# Patient Record
Sex: Male | Born: 1960 | Race: Black or African American | Hispanic: No | Marital: Single | Smoking: Current every day smoker
Health system: Southern US, Community
[De-identification: ages and names within clinical notes are randomized; demographics above are authoritative.]

## PROBLEM LIST (undated history)

## (undated) DIAGNOSIS — H9191 Unspecified hearing loss, right ear: Secondary | ICD-10-CM

## (undated) DIAGNOSIS — I639 Cerebral infarction, unspecified: Secondary | ICD-10-CM

## (undated) DIAGNOSIS — I10 Essential (primary) hypertension: Secondary | ICD-10-CM

## (undated) DIAGNOSIS — K219 Gastro-esophageal reflux disease without esophagitis: Secondary | ICD-10-CM

## (undated) HISTORY — PX: PROSTATE SURGERY: SHX751

## (undated) HISTORY — DX: Unspecified hearing loss, right ear: H91.91

## (undated) HISTORY — DX: Gastro-esophageal reflux disease without esophagitis: K21.9

---

## 1983-04-17 HISTORY — PX: OTHER SURGICAL HISTORY: SHX169

## 2010-08-09 ENCOUNTER — Inpatient Hospital Stay: Payer: Self-pay | Admitting: Internal Medicine

## 2011-05-17 ENCOUNTER — Ambulatory Visit: Payer: Self-pay | Admitting: Emergency Medicine

## 2011-05-17 DIAGNOSIS — I10 Essential (primary) hypertension: Secondary | ICD-10-CM

## 2011-05-17 LAB — CBC WITH DIFFERENTIAL/PLATELET
Basophil #: 0 10*3/uL (ref 0.0–0.1)
Lymphocyte %: 19.5 %
MCH: 33.5 pg (ref 26.0–34.0)
MCV: 99 fL (ref 80–100)
Monocyte %: 6.6 %
Neutrophil %: 71 %
Platelet: 195 10*3/uL (ref 150–440)
RBC: 4.57 10*6/uL (ref 4.40–5.90)
RDW: 13.8 % (ref 11.5–14.5)
WBC: 11 10*3/uL — ABNORMAL HIGH (ref 3.8–10.6)

## 2011-05-17 LAB — POTASSIUM: Potassium: 4.3 mmol/L (ref 3.5–5.1)

## 2011-05-17 LAB — PROTIME-INR: Prothrombin Time: 13.2 secs (ref 11.5–14.7)

## 2011-05-24 ENCOUNTER — Ambulatory Visit: Payer: Self-pay | Admitting: Emergency Medicine

## 2011-09-27 ENCOUNTER — Ambulatory Visit: Payer: Self-pay | Admitting: Internal Medicine

## 2012-04-16 DIAGNOSIS — I639 Cerebral infarction, unspecified: Secondary | ICD-10-CM

## 2012-04-16 HISTORY — DX: Cerebral infarction, unspecified: I63.9

## 2014-08-08 NOTE — Op Note (Signed)
PATIENT NAME:  Todd Craig, Todd Craig MR#:  [id number] DATE OF BIRTH:  1961/03/05  DATE OF PROCEDURE:  05/24/2011  PREOPERATIVE DIAGNOSIS: Right inguinal hernia.   POSTOPERATIVE DIAGNOSIS: Right inguinal hernia.   OPERATION: Repair of right inguinal hernia.   SURGEON: Vella Kohler, M.D.   FINDINGS: The patient had a very large right inguinal hernia, also had a large direct hernia, and very weak muscles on the posterior aspect of the inguinal canal.   INDICATION: This patient was seen by me in the office because of a right inguinal hernia which was very large and it was hard and difficult to reduce. The patient was having a lot of pain with this. He was then brought to surgery for repair of the hernia.   DESCRIPTION OF PROCEDURE: After he was put to sleep, the right inguinal groin area was then prepped and draped. An incision was made in the right groin. After cutting skin and subcutaneous tissue, the patient was kind of obese in this area, so I went all the way down to the external oblique fascia. The external oblique fascia was exposed. The Weitlaner retractor was then put in. The external canal was then seen and the external oblique was then opened. Care was taken not to injure the ilioinguinal nerve. The ilioinguinal nerve was then pushed back on the superior aspect behind the fascia. The patient had very large cord structures and it looked like he had a very thickened cord. With very much difficulty, I was able to lift it up at the pubic tubercle on a Penrose drain. It had a lot of thickened tissue. It looked like it could be a slider. Also there was a very large defect in the floor of the canal. After that I dissected up the cord structures, and after incising and looking for the sac, the patient had a very thickened sac and I felt I could feel a loop of bowel in it. It was stuck to the vas as well as to the cord blood vessels very firmly. It looked like it had been there for a long time. After a lot of  dissection to separate the cord structures from this hernia sac, which took a long time, I then pushed back into the abdomen. After that was pushed back in the abdomen, I put a large plug there and sutured it to the edges of the fascia. After that I placed a piece of mesh, attached it to the pubic tubercle area, to the shelving edge of the inguinal ligament, and around the cord and stitched this to each other and then superiorly to the conjoined tendon and shelving edge. I then sutured all around the cord. After this was done, the external oblique was then closed. The cord was replaced in the normal position. I made sure there was no injury to the vas or to the blood vessels. The patient did have a lot of veins in the cord. After this was done, the external oblique was then closed with interrupted Surgilon sutures, similar repair was also performed with Surgilon sutures to the mesh. After this was done, the testes were then pulled down. The subcutaneous tissue was closed with 3-0 Vicryl. The skin was closed with skin staples. A pressure dressing was applied. The patient tolerated the procedure well and was sent to the Recovery Room in satisfactory condition.   This was a very difficult repair to be done.  The patient had a very large hernia which seemed to be a slider and was  hard to reduce back into the abdomen. Also the transversalis fascia at the posterior floor was also very weak. It was difficult to repair. Finally after putting a plug in and placing mesh to reinforce this space, I was able to do it adequately. This patient also has a history of stroke and I do not know why he developed this kind of hernia. ____________________________ Welford Roche. Phylis Bougie, MD msh:slb D: 05/24/2011 09:53:27 ET T: 05/24/2011 10:07:11 ET JOB#: [id number]  cc: Krishang Reading S. Phylis Bougie, MD, <Dictator> Cletis Athens, MD Sharene Butters MD ELECTRONICALLY SIGNED 05/24/2011 12:32

## 2017-01-02 ENCOUNTER — Encounter: Payer: Self-pay | Admitting: *Deleted

## 2017-01-02 ENCOUNTER — Emergency Department
Admission: EM | Admit: 2017-01-02 | Discharge: 2017-01-02 | Disposition: A | Discharge status: Home / Self Care | Payer: Medicaid Other | Attending: Emergency Medicine | Admitting: Emergency Medicine

## 2017-01-02 ENCOUNTER — Emergency Department: Payer: Medicaid Other

## 2017-01-02 DIAGNOSIS — W010XXA Fall on same level from slipping, tripping and stumbling without subsequent striking against object, initial encounter: Secondary | ICD-10-CM | POA: Insufficient documentation

## 2017-01-02 DIAGNOSIS — Y92195 Garage of other specified residential institution as the place of occurrence of the external cause: Secondary | ICD-10-CM | POA: Diagnosis not present

## 2017-01-02 DIAGNOSIS — I10 Essential (primary) hypertension: Secondary | ICD-10-CM | POA: Insufficient documentation

## 2017-01-02 DIAGNOSIS — Y999 Unspecified external cause status: Secondary | ICD-10-CM | POA: Diagnosis not present

## 2017-01-02 DIAGNOSIS — Z8673 Personal history of transient ischemic attack (TIA), and cerebral infarction without residual deficits: Secondary | ICD-10-CM | POA: Diagnosis not present

## 2017-01-02 DIAGNOSIS — F17210 Nicotine dependence, cigarettes, uncomplicated: Secondary | ICD-10-CM | POA: Diagnosis not present

## 2017-01-02 DIAGNOSIS — S82402A Unspecified fracture of shaft of left fibula, initial encounter for closed fracture: Secondary | ICD-10-CM | POA: Insufficient documentation

## 2017-01-02 DIAGNOSIS — S8992XA Unspecified injury of left lower leg, initial encounter: Secondary | ICD-10-CM | POA: Diagnosis present

## 2017-01-02 DIAGNOSIS — Y9301 Activity, walking, marching and hiking: Secondary | ICD-10-CM | POA: Diagnosis not present

## 2017-01-02 HISTORY — DX: Essential (primary) hypertension: I10

## 2017-01-02 HISTORY — DX: Cerebral infarction, unspecified: I63.9

## 2017-01-02 MED ORDER — OXYCODONE-ACETAMINOPHEN 7.5-325 MG PO TABS
1.0000 | ORAL_TABLET | Freq: Four times a day (QID) | ORAL | 0 refills | Status: AC | PRN
Start: 2017-01-02 — End: 2018-01-02

## 2017-01-02 MED ORDER — TRAMADOL HCL 50 MG PO TABS
50.0000 mg | ORAL_TABLET | Freq: Once | ORAL | Status: AC
  Administered 2017-01-02: 50 mg via ORAL
  Filled 2017-01-02: qty 1

## 2017-01-02 MED ORDER — IBUPROFEN 600 MG PO TABS: 600.0000 mg | ORAL_TABLET | Freq: Three times a day (TID) | ORAL | 0 refills | Status: DC | PRN

## 2017-01-02 MED ORDER — OXYCODONE-ACETAMINOPHEN 7.5-325 MG PO TABS: 1.0000 | ORAL_TABLET | ORAL | 0 refills | Status: DC | PRN

## 2017-01-02 MED ORDER — IBUPROFEN 600 MG PO TABS
600.0000 mg | ORAL_TABLET | Freq: Once | ORAL | Status: AC
  Administered 2017-01-02: 600 mg via ORAL
  Filled 2017-01-02: qty 1

## 2017-01-02 NOTE — ED Notes (Signed)
Patient presents to the ED with left knee and left shoulder pain that began after falling in his garage yesterday evening.  Patient states he went from outside, into the dark garage and tripped over a dolly and fell.  Patient states he believes he did hit his head, did not pass out but did feel dazed.  Patient is in no obvious distress at this time.

## 2017-01-02 NOTE — ED Notes (Signed)
PT has known hx of HTN but reports he does not take his prescribed medication by choice.

## 2017-01-02 NOTE — ED Provider Notes (Signed)
[facility] Emergency Department Provider Note   ____________________________________________   First MD Initiated Contact with Patient 01/02/17 1254     (approximate)  I have reviewed the triage vital signs and the nursing notes.   HISTORY  Chief Complaint Fall    HPI Todd Craig is a 56 y.o. male patient complaining of pain to the upper and lower left extremities secondary to a trip and fall yesterday. Patient stated he tripped over a dolly and fell landed on his left side. Patient state pain to the left shoulder, left knee, and left ankle.Patient state most of his pain is located in the left ankle. He rates his overall pain as 8/10. No palliative measures prior to arrival. Ice packs placed on ankle secondary to obvious edema in triage.   Past Medical History:  Diagnosis Date  . Hypertension   . Stroke [facility])     There are no active problems to display for this patient.   History reviewed. No pertinent surgical history.  Prior to Admission medications   Medication Sig Start Date End Date Taking? Authorizing Provider  ibuprofen (ADVIL,MOTRIN) 600 MG tablet Take 1 tablet (600 mg total) by mouth every 8 (eight) hours as needed. 01/02/17   Sable Feil, PA-C  oxyCODONE-acetaminophen (PERCOCET) 7.5-325 MG tablet Take 1 tablet by mouth every 4 (four) hours as needed for severe pain. 01/02/17 01/02/18  Sable Feil, PA-C    Allergies Patient has no allergy information on record.  History reviewed. No pertinent family history.  Social History Social History  Substance Use Topics  . Smoking status: Current Every Day Smoker    Packs/day: 1.00    Types: Cigarettes  . Smokeless tobacco: Never Used  . Alcohol use 3.0 oz/week    5 Cans of beer per week    Review of Systems  Constitutional: No fever/chills Eyes: No visual changes. ENT: No sore throat. Cardiovascular: Denies chest pain. Respiratory: Denies shortness of  breath. Gastrointestinal: No abdominal pain.  No nausea, no vomiting.  No diarrhea.  No constipation. Genitourinary: Negative for dysuria. Musculoskeletal: Left shoulder, left knee, and left ankle pain Skin: Negative for rash. Neurological: Negative for headaches, focal weakness or numbness. Endocrine:Hypertension ____________________________________________   PHYSICAL EXAM:  VITAL SIGNS: ED Triage Vitals  Enc Vitals Group     BP 01/02/17 1230 (!) 153/92     Pulse Rate 01/02/17 1230 91     Resp 01/02/17 1230 16     Temp 01/02/17 1230 98.8 F (37.1 C)     Temp Source 01/02/17 1230 Oral     SpO2 01/02/17 1230 96 %     Weight 01/02/17 1230 234 lb (106.1 kg)     Height 01/02/17 1230 6\' 2"  (1.88 m)     Head Circumference --      Peak Flow --      Pain Score 01/02/17 1229 8     Pain Loc --      Pain Edu? --      Excl. in [place]? --     Constitutional: Alert and oriented. Well appearing and in no acute distress. Neck: No stridor.  No cervical spine tenderness to palpation. Cardiovascular: Normal rate, regular rhythm. Grossly normal heart sounds.  Good peripheral circulation. Respiratory: Normal respiratory effort.  No retractions. Lungs CTAB. Musculoskeletal: No obvious deformity to the left shoulder, left knee, or left ankle. Patient has decreased range of motion with abduction overhead reaching to left shoulder. Moderate guarding palpation GH joint. Examination of  the left knee shows no obvious effusion. Patient has moderate guarding with palpation at the insertion point of the MCL. Examination of left ankle shows moderate edema to lateral malleolus. Patient has full and equal range of motion to complain of pain of the ankle. Neurologic:  Normal speech and language. No gross focal neurologic deficits are appreciated. No gait instability. Skin:  Skin is warm, dry and intact. No rash noted. Psychiatric: Mood and affect are normal. Speech and behavior are  normal.  ____________________________________________   LABS (all labs ordered are listed, but only abnormal results are displayed)  Labs Reviewed - No data to display ____________________________________________  EKG   ____________________________________________  RADIOLOGY  Dg Ankle Complete Left  Result Date: 01/02/2017 CLINICAL DATA:  Fall last night onto left ankle. EXAM: LEFT ANKLE COMPLETE - 3+ VIEW COMPARISON:  None. FINDINGS: There is a coronal oblique fracture of the distal fibular diaphysis, nondisplaced. Normal ankle joint alignment. No medial clear space widening. Mild spurring at the medial malleolus without superimposed fracture. Equivocal for ankle joint effusion. IMPRESSION: Nondisplaced distal fibular diaphysis fracture. Normal alignment at the ankle. Electronically Signed   By: Monte Fantasia M.D.   On: 01/02/2017 13:15   Dg Shoulder Left  Result Date: 01/02/2017 CLINICAL DATA:  Fall in garage last night. Left shoulder pain. Insert H EXAM: LEFT SHOULDER - 2+ VIEW COMPARISON:  None. FINDINGS: There is no evidence of fracture or dislocation. Mild degenerative spurring seen along the undersurface of the acromion process. No other focal bone abnormality. Soft tissues are unremarkable. IMPRESSION: No acute findings. Mild degenerative spurring of the acromion process. Electronically Signed   By: Earle Gell M.D.   On: 01/02/2017 14:02    ____________________________________________   PROCEDURES  Procedure(s) performed: None  Procedures  Critical Care performed: No  ____________________________________________   INITIAL IMPRESSION / ASSESSMENT AND PLAN / ED COURSE  Pertinent labs & imaging results that were available during my care of the patient were reviewed by me and considered in my medical decision making (see chart for details).  Patient presents with pain to the upper and lower left extremities secondary to fall. X-ray was positive only for left distal  fibular fracture. Patient was placed in a splint and given crutches for ambulation. Patient advised contact orthopedics to schedule follow-up appointment for definitive evaluation and treatment. Patient given discharge Instructions and a prescription for ibuprofen and Percocets.      ____________________________________________   FINAL CLINICAL IMPRESSION(S) / ED DIAGNOSES  Final diagnoses:  Closed fracture of shaft of left fibula, unspecified fracture morphology, initial encounter      NEW MEDICATIONS STARTED DURING THIS VISIT:  New Prescriptions   IBUPROFEN (ADVIL,MOTRIN) 600 MG TABLET    Take 1 tablet (600 mg total) by mouth every 8 (eight) hours as needed.   OXYCODONE-ACETAMINOPHEN (PERCOCET) 7.5-325 MG TABLET    Take 1 tablet by mouth every 4 (four) hours as needed for severe pain.     Note:  This document was prepared using Dragon voice recognition software and may include unintentional dictation errors.    Sable Feil, PA-C 01/02/17 1408    Carrie Mew, MD 01/04/17 [phone]

## 2017-01-02 NOTE — Discharge Instructions (Signed)
Wear splint and ambulate with support and evaluation by orthopedics.

## 2017-01-02 NOTE — ED Triage Notes (Signed)
PT reports tripping over a dolly and falling on left side. PT reports pain in left ankle, knee and shoulder. Swelling noted in left ankle. Pt reports he is unable to bear weight on left foot.Pt able to move toes and sensation is intact. Pt denies LOC.

## 2017-01-03 DIAGNOSIS — S8265XA Nondisplaced fracture of lateral malleolus of left fibula, initial encounter for closed fracture: Secondary | ICD-10-CM | POA: Diagnosis not present

## 2017-01-03 DIAGNOSIS — M25512 Pain in left shoulder: Secondary | ICD-10-CM | POA: Diagnosis not present

## 2017-02-20 DIAGNOSIS — S8265XD Nondisplaced fracture of lateral malleolus of left fibula, subsequent encounter for closed fracture with routine healing: Secondary | ICD-10-CM | POA: Diagnosis not present

## 2019-03-28 DIAGNOSIS — Z20828 Contact with and (suspected) exposure to other viral communicable diseases: Secondary | ICD-10-CM | POA: Diagnosis not present

## 2019-12-30 ENCOUNTER — Ambulatory Visit: Payer: Medicaid Other | Admitting: Internal Medicine

## 2019-12-30 ENCOUNTER — Other Ambulatory Visit: Payer: Self-pay

## 2020-01-08 ENCOUNTER — Other Ambulatory Visit: Payer: Self-pay

## 2020-01-11 ENCOUNTER — Encounter: Payer: Self-pay | Admitting: Emergency Medicine

## 2020-01-11 ENCOUNTER — Encounter: Payer: Self-pay | Admitting: Nurse Practitioner

## 2020-01-11 ENCOUNTER — Ambulatory Visit: Payer: Medicaid Other | Admitting: Nurse Practitioner

## 2020-01-11 ENCOUNTER — Emergency Department
Admission: EM | Admit: 2020-01-11 | Discharge: 2020-01-11 | Disposition: A | Discharge status: Home / Self Care | Payer: Medicaid Other | Attending: Emergency Medicine | Admitting: Emergency Medicine

## 2020-01-11 ENCOUNTER — Other Ambulatory Visit: Payer: Self-pay

## 2020-01-11 ENCOUNTER — Emergency Department: Payer: Medicaid Other

## 2020-01-11 VITALS — BP 184/130 | HR 75 | Temp 98.2°F | Ht 74.0 in | Wt 231.0 lb

## 2020-01-11 DIAGNOSIS — I160 Hypertensive urgency: Secondary | ICD-10-CM

## 2020-01-11 DIAGNOSIS — I169 Hypertensive crisis, unspecified: Secondary | ICD-10-CM | POA: Diagnosis not present

## 2020-01-11 DIAGNOSIS — J90 Pleural effusion, not elsewhere classified: Secondary | ICD-10-CM | POA: Diagnosis not present

## 2020-01-11 DIAGNOSIS — I10 Essential (primary) hypertension: Secondary | ICD-10-CM | POA: Insufficient documentation

## 2020-01-11 DIAGNOSIS — F17210 Nicotine dependence, cigarettes, uncomplicated: Secondary | ICD-10-CM | POA: Insufficient documentation

## 2020-01-11 HISTORY — DX: Hypertensive crisis, unspecified: I16.9

## 2020-01-11 LAB — COMPREHENSIVE METABOLIC PANEL
ALT: 15 U/L (ref 0–44)
AST: 16 U/L (ref 15–41)
Albumin: 4.4 g/dL (ref 3.5–5.0)
Alkaline Phosphatase: 73 U/L (ref 38–126)
Anion gap: 8 (ref 5–15)
BUN: 12 mg/dL (ref 6–20)
CO2: 27 mmol/L (ref 22–32)
Calcium: 11.3 mg/dL — ABNORMAL HIGH (ref 8.9–10.3)
Chloride: 105 mmol/L (ref 98–111)
Creatinine, Ser: 1.46 mg/dL — ABNORMAL HIGH (ref 0.61–1.24)
GFR calc Af Amer: >60 mL/min (ref >60)
GFR calc non Af Amer: 52 mL/min — ABNORMAL LOW (ref >60)
Glucose, Bld: 99 mg/dL (ref 70–99)
Potassium: 4.1 mmol/L (ref 3.5–5.1)
Sodium: 140 mmol/L (ref 135–145)
Total Bilirubin: 1.3 mg/dL — ABNORMAL HIGH (ref 0.3–1.2)
Total Protein: 7.6 g/dL (ref 6.5–8.1)

## 2020-01-11 LAB — CBC WITH DIFFERENTIAL/PLATELET
Abs Immature Granulocytes: 0.04 10*3/uL (ref 0.00–0.07)
Basophils Absolute: 0.1 10*3/uL (ref 0.0–0.1)
Basophils Relative: 1 %
Eosinophils Absolute: 0.3 10*3/uL (ref 0.0–0.5)
Eosinophils Relative: 3 %
HCT: 45.4 % (ref 39.0–52.0)
Hemoglobin: 15.9 g/dL (ref 13.0–17.0)
Immature Granulocytes: 0 %
Lymphocytes Relative: 19 %
Lymphs Abs: 2 10*3/uL (ref 0.7–4.0)
MCH: 34.9 pg — ABNORMAL HIGH (ref 26.0–34.0)
MCHC: 35 g/dL (ref 30.0–36.0)
MCV: 99.6 fL (ref 80.0–100.0)
Monocytes Absolute: 0.6 10*3/uL (ref 0.1–1.0)
Monocytes Relative: 5 %
Neutro Abs: 7.8 10*3/uL — ABNORMAL HIGH (ref 1.7–7.7)
Neutrophils Relative %: 72 %
Platelets: 197 10*3/uL (ref 150–400)
RBC: 4.56 MIL/uL (ref 4.22–5.81)
RDW: 12.2 % (ref 11.5–15.5)
WBC: 10.8 10*3/uL — ABNORMAL HIGH (ref 4.0–10.5)
nRBC: 0 % (ref 0.0–0.2)

## 2020-01-11 MED ORDER — AMLODIPINE BESYLATE 5 MG PO TABS
5.0000 mg | ORAL_TABLET | Freq: Once | ORAL | Status: AC
  Administered 2020-01-11: 14:00:00 5 mg via ORAL
  Filled 2020-01-11: qty 1

## 2020-01-11 MED ORDER — AMLODIPINE BESYLATE 5 MG PO TABS: 5.0000 mg | ORAL_TABLET | Freq: Every day | ORAL | 1 refills | Status: DC

## 2020-01-11 NOTE — ED Triage Notes (Signed)
Pt presents to ED via POV with c/o HTN, pt states hx of HTN, has been out of his BP meds for a long time. Pt sent by PCP. Pt denies symptoms with his HTN. Pt is A&O x4, NAD noted, states needs refills of BP meds. Pt states was at his PCP earlier today for BP check and a physical.

## 2020-01-11 NOTE — ED Notes (Signed)
See triage note  Presents with HTN  States he has been out of his meds for a while    Denies any sxs'

## 2020-01-11 NOTE — Patient Instructions (Addendum)
Please go to the emergency department for further treatment evaluation of his severely elevated blood pressure with intermittent headaches, blurred vision.  You have not seen a physician in several years.  You have a history of a stroke in 2012.  Your BP today puts you at  risk for a repeat stroke.  Your blood pressure today is 184/130 repeated 180/130.   Follow-up in the office to perform CPE in the future.  Hypertension, Adult Hypertension is another name for high blood pressure. High blood pressure forces your heart to work harder to pump blood. This can cause problems over time. There are two numbers in a blood pressure reading. There is a top number (systolic) over a bottom number (diastolic). It is best to have a blood pressure that is below 120/80. Healthy choices can help lower your blood pressure, or you may need medicine to help lower it. What are the causes? The cause of this condition is not known. Some conditions may be related to high blood pressure. What increases the risk?  Smoking.  Having type 2 diabetes mellitus, high cholesterol, or both.  Not getting enough exercise or physical activity.  Being overweight.  Having too much fat, sugar, calories, or salt (sodium) in your diet.  Drinking too much alcohol.  Having long-term (chronic) kidney disease.  Having a family history of high blood pressure.  Age. Risk increases with age.  Race. You may be at higher risk if you are African American.  Gender. Men are at higher risk than women before age 63. After age 59, women are at higher risk than men.  Having obstructive sleep apnea.  Stress. What are the signs or symptoms?  High blood pressure may not cause symptoms. Very high blood pressure (hypertensive crisis) may cause: ? Headache. ? Feelings of worry or nervousness (anxiety). ? Shortness of breath. ? Nosebleed. ? A feeling of being sick to your stomach (nausea). ? Throwing up (vomiting). ? Changes in how  you see. ? Very bad chest pain. ? Seizures. How is this treated?  This condition is treated by making healthy lifestyle changes, such as: ? Eating healthy foods. ? Exercising more. ? Drinking less alcohol.  Your health care provider may prescribe medicine if lifestyle changes are not enough to get your blood pressure under control, and if: ? Your top number is above 130. ? Your bottom number is above 80.  Your personal target blood pressure may vary. Follow these instructions at home: Eating and drinking   If told, follow the DASH eating plan. To follow this plan: ? Fill one half of your plate at each meal with fruits and vegetables. ? Fill one fourth of your plate at each meal with whole grains. Whole grains include whole-wheat pasta, brown rice, and whole-grain bread. ? Eat or drink low-fat dairy products, such as skim milk or low-fat yogurt. ? Fill one fourth of your plate at each meal with low-fat (lean) proteins. Low-fat proteins include fish, chicken without skin, eggs, beans, and tofu. ? Avoid fatty meat, cured and processed meat, or chicken with skin. ? Avoid pre-made or processed food.  Eat less than 1,500 mg of salt each day.  Do not drink alcohol if: ? Your doctor tells you not to drink. ? You are pregnant, may be pregnant, or are planning to become pregnant.  If you drink alcohol: ? Limit how much you use to:  0-1 drink a day for women.  0-2 drinks a day for men. ? Be aware  of how much alcohol is in your drink. In the [place] one drink equals one 12 oz bottle of beer (355 mL), one 5 oz glass of wine (148 mL), or one 1 oz glass of hard liquor (44 mL). Lifestyle   Work with your doctor to stay at a healthy weight or to lose weight. Ask your doctor what the best weight is for you.  Get at least 30 minutes of exercise most days of the week. This may include walking, swimming, or biking.  Get at least 30 minutes of exercise that strengthens your muscles  (resistance exercise) at least 3 days a week. This may include lifting weights or doing Pilates.  Do not use any products that contain nicotine or tobacco, such as cigarettes, e-cigarettes, and chewing tobacco. If you need help quitting, ask your doctor.  Check your blood pressure at home as told by your doctor.  Keep all follow-up visits as told by your doctor. This is important. Medicines  Take over-the-counter and prescription medicines only as told by your doctor. Follow directions carefully.  Do not skip doses of blood pressure medicine. The medicine does not work as well if you skip doses. Skipping doses also puts you at risk for problems.  Ask your doctor about side effects or reactions to medicines that you should watch for. Contact a doctor if you:  Think you are having a reaction to the medicine you are taking.  Have headaches that keep coming back (recurring).  Feel dizzy.  Have swelling in your ankles.  Have trouble with your vision. Get help right away if you:  Get a very bad headache.  Start to feel mixed up (confused).  Feel weak or numb.  Feel faint.  Have very bad pain in your: ? Chest. ? Belly (abdomen).  Throw up more than once.  Have trouble breathing. Summary  Hypertension is another name for high blood pressure.  High blood pressure forces your heart to work harder to pump blood.  For most people, a normal blood pressure is less than 120/80.  Making healthy choices can help lower blood pressure. If your blood pressure does not get lower with healthy choices, you may need to take medicine. This information is not intended to replace advice given to you by your health care provider. Make sure you discuss any questions you have with your health care provider. Document Revised: 12/11/2017 Document Reviewed: 12/11/2017 Elsevier Patient Education  2020 [employer].

## 2020-01-11 NOTE — Progress Notes (Signed)
Established Patient Office Visit  Subjective:  Patient ID: Todd Craig, male    DOB: 12-09-1960  Age: 59 y.o. MRN: [id number]  CC:  Chief Complaint  Patient presents with  . 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  He reports he saw Dr. Rebecka Apley many years ago.  He has been off of his BP  medication for many years.  His fiance noticed that he has been having fatigue, appeared very sluggish, and he has been complaining of headache and blurred vision.  She suspected his blood pressure was elevated and brought him in for CPE.    Currently, patient denies any  headache, chest pain, pressure,  Heaviness, tightness,  shortness of breath or DOE.  He has been experiencing epigastric discomfort and belching.  No nausea or vomiting.  He does smoke 4 cigarettes a day.  He drinks 40 ounces of beer daily.  No illicit drug use.  Blood pressure repeated by me 180/130 on right arm, 184/130 left arm, heart rate 75 pulse oximetry room air 97%.   Past Medical History:  Diagnosis Date  . GERD (gastroesophageal reflux disease)   . Hypertension   . Stroke [facility])     Past Surgical History:  Procedure Laterality Date  . right hand repair  1985   trauma with saw laceration     No family history on file.  Social History   Socioeconomic History  . Marital status: Single    Spouse name: Not on file  . Number of children: Not on file  . Years of education: Not on file  . Highest education level: Not on file  Occupational History  . Not on file  Tobacco Use  . Smoking status: Current Every Day Smoker    Packs/day: 1.00    Types: Cigarettes  . Smokeless tobacco: Never Used  Vaping Use  . Vaping Use: Never used  Substance and Sexual Activity  . Alcohol use: Yes    Alcohol/week: 5.0  standard drinks    Types: 5 Cans of beer per week  . Drug use: No  . Sexual activity: Yes  Other Topics Concern  . Not on file  Social History Narrative  . Not on file   Social Determinants of Health   Financial Resource Strain:   . Difficulty of Paying Living Expenses: Not on file  Food Insecurity:   . Worried About Charity fundraiser in the Last Year: Not on file  . Ran Out of Food in the Last Year: Not on file  Transportation Needs:   . Lack of Transportation (Medical): Not on file  . Lack of Transportation (Non-Medical): Not on file  Physical Activity:   . Days of Exercise per Week: Not on file  . Minutes of Exercise per Session: Not on file  Stress:   . Feeling of Stress : Not on file  Social Connections:   . Frequency of Communication with Friends and Family: Not on file  . Frequency of Social Gatherings with Friends and Family: Not on file  . Attends Religious Services: Not on file  . Active Member of Clubs or Organizations: Not on file  . Attends Archivist Meetings: Not on file  . Marital Status: Not on file  Intimate Partner Violence:   .  Fear of Current or Ex-Partner: Not on file  . Emotionally Abused: Not on file  . Physically Abused: Not on file  . Sexually Abused: Not on file    Outpatient Medications Prior to Visit  Medication Sig Dispense Refill  . ibuprofen (ADVIL,MOTRIN) 600 MG tablet Take 1 tablet (600 mg total) by mouth every 8 (eight) hours as needed. 15 tablet 0   No facility-administered medications prior to visit.    No Known Allergies  Review of Systems Pertinent positives as noted in history of present illness.   Objective:    Physical Exam Vitals reviewed.  Constitutional:      Appearance: Normal appearance.  Eyes:     Conjunctiva/sclera: Conjunctivae normal.     Pupils: Pupils are equal, round, and reactive to light.  Cardiovascular:     Rate and Rhythm: Normal rate and regular rhythm.     Pulses: Normal pulses.      Heart sounds: Normal heart sounds.  Pulmonary:     Effort: Pulmonary effort is normal.     Breath sounds: Normal breath sounds.  Abdominal:     Palpations: Abdomen is soft.     Tenderness: There is no abdominal tenderness.  Musculoskeletal:        General: Normal range of motion.     Cervical back: Normal range of motion and neck supple.     Right lower leg: No edema.     Left lower leg: No edema.  Skin:    General: Skin is warm and dry.  Neurological:     Mental Status: He is alert. Mental status is at baseline.     Comments: Oriented x 2 person, place- clinic, month- Nov, year      BP (!) 184/130   Pulse 75   Temp 98.2 F (36.8 C) (Oral)   Ht 6\' 2"  (1.88 m)   Wt 231 lb (104.8 kg)   SpO2 97%   BMI 29.66 kg/m  Wt Readings from Last 3 Encounters:  01/11/20 231 lb (104.8 kg)  01/02/17 234 lb (106.1 kg)   Pulse Readings from Last 3 Encounters:  01/11/20 75  01/02/17 66    BP Readings from Last 3 Encounters:  01/11/20 (!) 184/130  01/02/17 (!) 167/107    No results found for: CHOL, HDL, LDLCALC, LDLDIRECT, TRIG, CHOLHDL    Health Maintenance Due  Topic Date Due  . Hepatitis C Screening  Never done  . HIV Screening  Never done  . TETANUS/TDAP  Never done  . COLONOSCOPY  Never done    There are no preventive care reminders to display for this patient.  No results found for: TSH Lab Results  Component Value Date   WBC 11.0 (H) 05/17/2011   HGB 15.3 05/17/2011   HCT 45.0 05/17/2011   MCV 99 05/17/2011   PLT 195 05/17/2011   Lab Results  Component Value Date   K 4.0 05/24/2011   No results found for: CHOL No results found for: HDL No results found for: LDLCALC No results found for: TRIG No results found for: CHOLHDL No results found for: HGBA1C    Assessment & Plan:   Problem List Items Addressed This Visit      Cardiovascular and Mediastinum   Hypertensive crisis, unspecified - Primary      No orders of the defined types were placed in this  encounter. Please go to the emergency department for further treatment evaluation of his severely elevated blood pressure with intermittent headaches, blurred vision.  You have not seen a physician in several years.  You have a history of a stroke in 2012.  Your BP today puts you at  risk for a repeat stroke.  Your blood pressure today is 184/130 repeated 180/130.   Follow-up in the office to perform CPE in the future.  Follow-up: No follow-ups on file.   This visit occurred during the SARS-CoV-2 public health emergency.  Safety protocols were in place, including screening questions prior to the visit, additional usage of staff PPE, and extensive cleaning of exam room while observing appropriate contact time as indicated for disinfecting solutions.   Denice Paradise, NP

## 2020-01-11 NOTE — ED Provider Notes (Addendum)
Medical screening examination/treatment/procedure(s) were conducted as a shared visit with non-physician practitioner(s) and myself.  I personally evaluated the patient during the encounter.     Personally saw and evaluated the patient.  He is asymptomatic.  Referred here after physical exam where his blood pressure was noted to be significantly elevated  He does report a longstanding history of hypertension untreated.  He reports to me that a very important part of that is that his previous medicine caused erectile dysfunction and he would like to find a medicine that might treat his blood pressure that would not have high risk of that side effect.  He appears to have mild renal insufficiency, Based on discussion with patient we will start him on amlodipine.  He will be able to follow-up with the primary doctor he saw today.  He has no symptoms.  No signs of endorgan dysfunction other than a very mildly ill his creatinine which I would suspect is chronic in nature does not appear to require immediate hospitalization.     Delman Kitten, MD 01/11/20 1359   EKG personally interpreted by me at 1325 Heart rate 75 QRS 90 QTc 410 Normal sinus rhythm, left ventricular hypertrophy with repolarization abnormality.  No major changes compared to previous EKG from about 10 years ago   Delman Kitten, MD 01/11/20 1359

## 2020-01-11 NOTE — ED Provider Notes (Signed)
[facility] Emergency Department Provider Note  ____________________________________________   First MD Initiated Contact with Patient 01/11/20 1232     (approximate)  I have reviewed the triage vital signs and the nursing notes.   HISTORY  Chief Complaint Hypertension   HPI Todd Craig is a 59 y.o. male who presents to the emergency department for evaluation of hypertension.  The patient states that he has not had a primary care in quite some time, and was urged today to seek a primary care visit.  When he went to this visit, the practitioner sent him to the emergency room for evaluation of his hypertension.  The patient states that he has had hypertension since diagnosed in 2012 after his stroke.  At that time, the patient was started on a blood pressure medication but discontinued it secondary to erectile dysfunction.  He has not been on anything in quite some time.  He denies any acute symptoms at this time of his blood pressure.  He denies headache, blurred vision, weakness.         Past Medical History:  Diagnosis Date  . GERD (gastroesophageal reflux disease)   . Hypertension   . Stroke [facility])     Patient Active Problem List   Diagnosis Date Noted  . Hypertensive crisis, unspecified 01/11/2020    Past Surgical History:  Procedure Laterality Date  . right hand repair  1985   trauma with saw laceration     Prior to Admission medications   Medication Sig Start Date End Date Taking? Authorizing Provider  amLODipine (NORVASC) 5 MG tablet Take 1 tablet (5 mg total) by mouth daily. 01/11/20 03/11/20  Marlana Salvage, PA    Allergies Patient has no known allergies.  History reviewed. No pertinent family history.  Social History Social History   Tobacco Use  . Smoking status: Current Every Day Smoker    Packs/day: 1.00    Types: Cigarettes  . Smokeless tobacco: Never Used  Vaping Use  . Vaping Use: Never used  Substance Use Topics    . Alcohol use: Yes    Alcohol/week: 5.0 standard drinks    Types: 5 Cans of beer per week  . Drug use: No    Review of Systems Constitutional: No fever/chills Eyes: No visual changes. ENT: No sore throat. Cardiovascular: + Hypertension, denies chest pain. Respiratory: Denies shortness of breath. Gastrointestinal: No abdominal pain.  No nausea, no vomiting.  No diarrhea.  No constipation. Genitourinary: Negative for dysuria. Musculoskeletal: Negative for back pain. Skin: Negative for rash. Neurological: Negative for headaches, focal weakness or numbness.   ____________________________________________   PHYSICAL EXAM:  VITAL SIGNS: ED Triage Vitals [01/11/20 1223]  Enc Vitals Group     BP (!) 190/127     Pulse Rate 68     Resp 20     Temp 98.9 F (37.2 C)     Temp Source Oral     SpO2 98 %     Weight 231 lb (104.8 kg)     Height 6\' 2"  (1.88 m)     Head Circumference      Peak Flow      Pain Score 0     Pain Loc      Pain Edu?      Excl. in [place]?     Constitutional: Alert and oriented. Well appearing and in no acute distress. Eyes: Conjunctivae are normal. PERRL. EOMI. Head: Atraumatic. Nose: No congestion/rhinnorhea. Mouth/Throat: Mucous membranes are moist.  Oropharynx  non-erythematous. Neck: No stridor.   Cardiovascular: Normal rate, regular rhythm. Grossly normal heart sounds.  Good peripheral circulation. Respiratory: Normal respiratory effort.  No retractions. Lungs CTAB. Gastrointestinal: Soft and nontender. No distention. No abdominal bruits. No CVA tenderness. Musculoskeletal: No lower extremity tenderness nor edema.  No joint effusions. Neurologic:  Normal speech and language. No gross focal neurologic deficits are appreciated. No gait instability. Skin:  Skin is warm, dry and intact. No rash noted. Psychiatric: Mood and affect are normal. Speech and behavior are normal.  ____________________________________________   LABS (all labs ordered are  listed, but only abnormal results are displayed)  Labs Reviewed  CBC WITH DIFFERENTIAL/PLATELET - Abnormal; Notable for the following components:      Result Value   WBC 10.8 (*)    MCH 34.9 (*)    Neutro Abs 7.8 (*)    All other components within normal limits  COMPREHENSIVE METABOLIC PANEL - Abnormal; Notable for the following components:   Creatinine, Ser 1.46 (*)    Calcium 11.3 (*)    Total Bilirubin 1.3 (*)    GFR calc non Af Amer 52 (*)    All other components within normal limits   ____________________________________________  EKG  See Dr. Malachi Bonds note. ____________________________________________  [place]  Official radiology report(s): DG Chest 2 View  Result Date: 01/11/2020 CLINICAL DATA:  Hypertension. EXAM: CHEST - 2 VIEW COMPARISON:  08/09/2010. FINDINGS: Mediastinum and hilar structures normal. Lungs are clear. Tiny bilateral pleural effusions cannot be excluded. Heart size normal. No acute bony abnormality identified. No evidence of fracture. IMPRESSION: Tiny bilateral pleural effusions cannot be excluded. No acute cardiopulmonary disease otherwise noted. Electronically Signed   By: Marcello Moores  Register   On: 01/11/2020 13:17    ____________________________________________   INITIAL IMPRESSION / ASSESSMENT AND PLAN / ED COURSE  As part of my medical decision making, I reviewed the following data within the [place] notes reviewed and incorporated        Todd Craig is a 60 year old male who presents to the emergency department for evaluation of hypertension as he was referred from a primary care office.  Upon presentation, the patient's blood pressure was 190/127 in triage.  Other vital signs were normal.  The patient reports being asymptomatic at this time.  Despite this, basic labs, EKG and chest x-ray were performed.  CMP demonstrates an elevated creatinine of 1.46 with a normal GFR and BUN.  EKG also demonstrates some left  ventricular hypertrophy which is grossly unchanged from his last EKG in 2012.  Straight is grossly normal.  At this time, we will treat the patient with amlodipine here and monitor his blood pressure.   The patient's blood pressure came down to 156/109 actually just prior to receipt of the medication.  The case was discussed with Dr. Jacqualine Code who also saw the patient in person and assisted with the plan.  At this time, feel the patient is safe for discharge as he does not have evidence of any acute organ damage and is asymptomatic with his blood pressure.  I will prescribe the patient a 60-day supply of the amlodipine, this has a lesser report of erectile dysfunction.  The patient will follow up with the primary care that he established with today for further evaluation of his renal function as well as continued treatment of his hypertension.  The patient is amenable with this plan is stable for discharge at this time.     ____________________________________________   FINAL CLINICAL IMPRESSION(S) /  ED DIAGNOSES  Final diagnoses:  Hypertensive urgency     ED Discharge Orders         Ordered    amLODipine (NORVASC) 5 MG tablet  Daily       Note to Pharmacy: Prescriber not yet authorized with medicaid. Please authorize by Laban Emperor, PA-C. NPI: [id number]   01/11/20 1456          *Please note:  Todd Craig was evaluated in Emergency Department on 01/11/2020 for the symptoms described in the history of present illness. He was evaluated in the context of the global COVID-19 pandemic, which necessitated consideration that the patient might be at risk for infection with the SARS-CoV-2 virus that causes COVID-19. Institutional protocols and algorithms that pertain to the evaluation of patients at risk for COVID-19 are in a state of rapid change based on information released by regulatory bodies including the CDC and federal and state organizations. These policies and algorithms were followed  during the patient's care in the ED.  Some ED evaluations and interventions may be delayed as a result of limited staffing during and the pandemic.*   Note:  This document was prepared using Dragon voice recognition software and may include unintentional dictation errors.    Marlana Salvage, PA 01/11/20 Jeffie Pollock    Delman Kitten, MD 01/11/20 2005

## 2020-01-11 NOTE — Discharge Instructions (Addendum)
Please take the amlodipine once daily as prescribed.  Please follow-up with your primary care regarding your hypertension and kidney function.

## 2020-01-12 ENCOUNTER — Telehealth: Payer: Self-pay

## 2020-01-12 NOTE — Telephone Encounter (Signed)
Transition Care Management Unsuccessful Follow-up Telephone Call  Date of discharge and from where:  01/11/2020 [facility]   Attempts:  1st Attempt  Reason for unsuccessful TCM follow-up call:  Left voice message with significant other to call back.   TA/CMA

## 2020-01-15 ENCOUNTER — Other Ambulatory Visit: Payer: Self-pay

## 2020-01-19 ENCOUNTER — Encounter: Payer: Self-pay | Admitting: Nurse Practitioner

## 2020-01-19 ENCOUNTER — Other Ambulatory Visit: Payer: Self-pay

## 2020-01-19 ENCOUNTER — Ambulatory Visit (INDEPENDENT_AMBULATORY_CARE_PROVIDER_SITE_OTHER): Payer: Medicaid Other | Admitting: Nurse Practitioner

## 2020-01-19 VITALS — BP 150/100 | HR 81 | Temp 97.9°F | Resp 16 | Wt 232.0 lb

## 2020-01-19 DIAGNOSIS — I10 Essential (primary) hypertension: Secondary | ICD-10-CM | POA: Insufficient documentation

## 2020-01-19 DIAGNOSIS — E663 Overweight: Secondary | ICD-10-CM

## 2020-01-19 DIAGNOSIS — F17290 Nicotine dependence, other tobacco product, uncomplicated: Secondary | ICD-10-CM

## 2020-01-19 MED ORDER — AMLODIPINE BESYLATE 5 MG PO TABS: 5.0000 mg | ORAL_TABLET | Freq: Every day | ORAL | 1 refills | Status: DC

## 2020-01-19 NOTE — Progress Notes (Signed)
Established Patient Office Visit  Subjective:  Patient ID: Todd Craig, male    DOB: 30-Jan-1961  Age: 59 y.o. MRN: [id number]  CC:  Chief Complaint  Patient presents with  . Follow-up    ED follow up- hypertension    HPI Todd Craig is a 59 year old with hx of CVA (2012) who returns for hypertension.  He established care on 01/11/2020 after a long hiatus without routine medical care. He had a severely elevated BP 184/130 with blurred vision and was sent to the emergency department.  01/11/20: ED: His vital signs on arrival in the  ED [id number], SpO2 98%.  He was treated with amlodipine 5 mg.  Blood pressure came down to 156/109 just prior to receiving medication.  He was felt safe for discharge as he was asymptomatic with his hypertension and did not have evidence of acute organ damage.  His c-Met showed an elevated creatinine of 1.46 with normal GFR and BUN. His Hgb was normal 15.9, MCH and WBC slightly elevated.  01/11/20: CXR:  IMPRESSION: Tiny bilateral pleural effusions cannot be excluded. No acute cardiopulmonary disease otherwise noted.  01/11/2020: EKG showed left ventricular hypertrophy grossly unchanged from EKG in 2012.     Chemistry      Component Value Date/Time   NA 140 01/11/2020 1300   K 4.1 01/11/2020 1300   K 4.0 05/24/2011 0640   CL 105 01/11/2020 1300   CO2 27 01/11/2020 1300   BUN 12 01/11/2020 1300   CREATININE 1.46 (H) 01/11/2020 1300      Component Value Date/Time   CALCIUM 11.3 (H) 01/11/2020 1300   ALKPHOS 73 01/11/2020 1300   AST 16 01/11/2020 1300   ALT 15 01/11/2020 1300   BILITOT 1.3 (H) 01/11/2020 1300      Last CBC Lab Results  Component Value Date   WBC 10.8 (H) 01/11/2020   HGB 15.9 01/11/2020   HCT 45.4 01/11/2020   MCV 99.6 01/11/2020   MCH 34.9 (H) 01/11/2020   RDW 12.2 01/11/2020   PLT 197 01/11/2020   Today, he comes in with his fiancee.  He has been taking his amlodipine 5 mg daily as directed.  He does not check his blood  pressure at home.  He is no longer experiencing fatigue or blurred vision. He  feels much better.  He has no epigastric discomfort or belching.  No nausea or vomiting.  He has cut back his alcohol from 40 ounces of beer daily to none in the last few days.  He does still smoke his 4 cigarettes a day.  We advised eye check since he had intermittent blurred vision. This needs to be rescheduled.  He has not seen a dentist in 30 years.  He has Medicaid and does not know if he has Designer, fashion/clothing.  He has no dental concerns.   Past Medical History:  Diagnosis Date  . GERD (gastroesophageal reflux disease)   . Hypertension   . Stroke [facility])     Past Surgical History:  Procedure Laterality Date  . right hand repair  1985   trauma with saw laceration     History reviewed. No pertinent family history.  Social History   Socioeconomic History  . Marital status: Single    Spouse name: Not on file  . Number of children: Not on file  . Years of education: Not on file  . Highest education level: Not on file  Occupational History  . Not on file  Tobacco Use  .  Smoking status: Current Every Day Smoker    Packs/day: 1.00    Types: Cigarettes  . Smokeless tobacco: Never Used  Vaping Use  . Vaping Use: Never used  Substance and Sexual Activity  . Alcohol use: Yes    Alcohol/week: 5.0 standard drinks    Types: 5 Cans of beer per week  . Drug use: No  . Sexual activity: Yes  Other Topics Concern  . Not on file  Social History Narrative  . Not on file   Social Determinants of Health   Financial Resource Strain:   . Difficulty of Paying Living Expenses: Not on file  Food Insecurity:   . Worried About Charity fundraiser in the Last Year: Not on file  . Ran Out of Food in the Last Year: Not on file  Transportation Needs:   . Lack of Transportation (Medical): Not on file  . Lack of Transportation (Non-Medical): Not on file  Physical Activity:   . Days of Exercise per Week: Not on file    . Minutes of Exercise per Session: Not on file  Stress:   . Feeling of Stress : Not on file  Social Connections:   . Frequency of Communication with Friends and Family: Not on file  . Frequency of Social Gatherings with Friends and Family: Not on file  . Attends Religious Services: Not on file  . Active Member of Clubs or Organizations: Not on file  . Attends Archivist Meetings: Not on file  . Marital Status: Not on file  Intimate Partner Violence:   . Fear of Current or Ex-Partner: Not on file  . Emotionally Abused: Not on file  . Physically Abused: Not on file  . Sexually Abused: Not on file    Outpatient Medications Prior to Visit  Medication Sig Dispense Refill  . amLODipine (NORVASC) 5 MG tablet Take 1 tablet (5 mg total) by mouth daily. 30 tablet 1   No facility-administered medications prior to visit.    No Known Allergies  Review of Systems  Constitutional: Negative for chills, fever and unexpected weight change.  HENT: Negative.   Respiratory: Negative.   Cardiovascular: Negative.   Gastrointestinal: Negative.   Genitourinary: Negative.   Musculoskeletal: Negative.   Neurological: Negative for dizziness, seizures, speech difficulty, light-headedness and headaches.  Psychiatric/Behavioral: Negative.       Objective:    Physical Exam Vitals reviewed.  Constitutional:      Appearance: He is obese.  HENT:     Head: Normocephalic.  Eyes:     Conjunctiva/sclera: Conjunctivae normal.     Pupils: Pupils are equal, round, and reactive to light.  Cardiovascular:     Rate and Rhythm: Normal rate and regular rhythm.     Pulses: Normal pulses.     Heart sounds: Normal heart sounds.  Pulmonary:     Effort: Pulmonary effort is normal.     Breath sounds: Normal breath sounds.  Abdominal:     Palpations: Abdomen is soft.     Tenderness: There is no abdominal tenderness.  Musculoskeletal:        General: Normal range of motion.     Cervical back:  Normal range of motion and neck supple.     Right lower leg: No edema.     Left lower leg: No edema.  Skin:    General: Skin is warm and dry.  Neurological:     Mental Status: He is alert and oriented to person, place, and time. Mental  status is at baseline.  Psychiatric:        Mood and Affect: Mood normal.        Behavior: Behavior normal.     BP (!) 150/100   Pulse 81   Temp 97.9 F (36.6 C) (Oral)   Resp 16   Wt 232 lb (105.2 kg)   SpO2 98%   BMI 29.79 kg/m  Wt Readings from Last 3 Encounters:  01/19/20 232 lb (105.2 kg)  01/11/20 231 lb (104.8 kg)  01/11/20 231 lb (104.8 kg)   Pulse Readings from Last 3 Encounters:  01/19/20 81  01/11/20 70  01/11/20 75    BP Readings from Last 3 Encounters:  01/19/20 (!) 150/100  01/11/20 (!) 156/109  01/11/20 (!) 184/130    No results found for: CHOL, HDL, LDLCALC, LDLDIRECT, TRIG, CHOLHDL    Health Maintenance Due  Topic Date Due  . Hepatitis C Screening  Never done  . HIV Screening  Never done  . TETANUS/TDAP  Never done  . COLONOSCOPY  Never done    There are no preventive care reminders to display for this patient.  No results found for: TSH Lab Results  Component Value Date   WBC 10.8 (H) 01/11/2020   HGB 15.9 01/11/2020   HCT 45.4 01/11/2020   MCV 99.6 01/11/2020   PLT 197 01/11/2020   Lab Results  Component Value Date   NA 140 01/11/2020   K 4.1 01/11/2020   CO2 27 01/11/2020   GLUCOSE 99 01/11/2020   BUN 12 01/11/2020   CREATININE 1.46 (H) 01/11/2020   BILITOT 1.3 (H) 01/11/2020   ALKPHOS 73 01/11/2020   AST 16 01/11/2020   ALT 15 01/11/2020   PROT 7.6 01/11/2020   ALBUMIN 4.4 01/11/2020   CALCIUM 11.3 (H) 01/11/2020   ANIONGAP 8 01/11/2020   No results found for: CHOL No results found for: HDL No results found for: LDLCALC No results found for: TRIG No results found for: CHOLHDL No results found for: HGBA1C    Assessment & Plan:   Problem List Items Addressed This Visit       Cardiovascular and Mediastinum   Primary hypertension - Primary   Relevant Medications   amLODipine (NORVASC) 5 MG tablet     Other   Nicotine dependence due to vaping tobacco product   Overweight with body mass index (BMI) 25.0-29.9      Meds ordered this encounter  Medications  . amLODipine (NORVASC) 5 MG tablet    Sig: Take 1 tablet (5 mg total) by mouth daily.    Dispense:  30 tablet    Refill:  1    Prescriber not yet authorized with medicaid. Please authorize by Laban Emperor, PA-C. NPI: [id number]    Order Specific Question:   Supervising Provider    Answer:   Einar Pheasant [[id number]]   Please continue taking the amlodipine 5 mg daily .  Your blood pressure in the office today is definitely improved but is not yet to goal. I would like to see how you do on this medicine for 2 more weeks.  Please come in for nurse visit for repeat blood pressure in 2 weeks. We will decide if a low dose diuretic- or fluid pill- needs to be added. We will then need to have blood work done in 2 weeks following the addition of a fluid pill.  You are due for routine physical labs in the future. Lab to be arranged in the future as we work  on your BP medication adjustments. He will need ref ophthalmology and dental phone number. Consider Cardiology referral.   Low salt diet.   Dash diet plan- see below.   Hydrate well with water -64 oz per day.    Stop smoking-cessation advice for 5 mins today.   Smoking cessation information provided with [place] quit line, [phone] and call for more information,   Consider chewing nicotine gum or lozenges.  You would use the lowest dose possible 2 mg dose.    Weeks 1-6: use 1 piece every 1-2 hours.   Weeks 7-8 use 1 piece every 2-4 hours  Weeks 10-12 1 piece every 4-8 hours Duration of therapy is 12 weeks.    Follow-up: Return in about 4 weeks (around 02/16/2020).  This visit occurred during the SARS-CoV-2 public health emergency.  Safety protocols  were in place, including screening questions prior to the visit, additional usage of staff PPE, and extensive cleaning of exam room while observing appropriate contact time as indicated for disinfecting solutions.    Denice Paradise, NP

## 2020-01-19 NOTE — Patient Instructions (Addendum)
Please continue taking the amlodipine 5 mg daily .  Your blood pressure in the office today is definitely improved but is not yet to goal. I would like to see how you do on this medicine for 2 more weeks.  Please come in for nurse visit for repeat blood pressure in 2 weeks. We will decide if a low dose diuretic- or fluid pill- needs to be added.   You are due for routine physical labs in the future. Lab to be arranged in the future as we work on your BP medication adjustments.  Low salt diet.   Dash diet plan- see below.   Hydrate well with water -64 oz per day.    Stop smoking-  Smoking cessation information provided with [place] quit line, 1 [phone] and call for more information,   Consider chewing nicotine gum or lozenges.  You would use the lowest dose possible 2 mg dose.    Weeks 1-6: use 1 piece every 1-2 hours.   Weeks 7-8 use 1 piece every 2-4 hours  Weeks 10-12 1 piece every 4-8 hours Duration of therapy is 12 weeks.       Steps to Quit Smoking Smoking tobacco is the leading cause of preventable death. It can affect almost every organ in the body. Smoking puts you and those around you at risk for developing many serious chronic diseases. Quitting smoking can be difficult, but it is one of the best things that you can do for your health. It is never too late to quit. How do I get ready to quit? When you decide to quit smoking, create a plan to help you succeed. Before you quit:  Pick a date to quit. Set a date within the next 2 weeks to give you time to prepare.  Write down the reasons why you are quitting. Keep this list in places where you will see it often.  Tell your family, friends, and co-workers that you are quitting. Support from your loved ones can make quitting easier.  Talk with your health care provider about your options for quitting smoking.  Find out what treatment options are covered by your health insurance.  Identify people, places, things, and  activities that make you want to smoke (triggers). Avoid them. What first steps can I take to quit smoking?  Throw away all cigarettes at home, at work, and in your car.  Throw away smoking accessories, such as Scientist, research (medical).  Clean your car. Make sure to empty the ashtray.  Clean your home, including curtains and carpets. What strategies can I use to quit smoking? Talk with your health care provider about combining strategies, such as taking medicines while you are also receiving in-person counseling. Using these two strategies together makes you more likely to succeed in quitting than if you used either strategy on its own.  If you are pregnant or breastfeeding, talk with your health care provider about finding counseling or other support strategies to quit smoking. Do not take medicine to help you quit smoking unless your health care provider tells you to do so. To quit smoking: Quit right away  Quit smoking completely, instead of gradually reducing how much you smoke over a period of time. Research shows that stopping smoking right away is more successful than gradually quitting.  Attend in-person counseling to help you build problem-solving skills. You are more likely to succeed in quitting if you attend counseling sessions regularly. Even short sessions of 10 minutes can be effective. Take  medicine You may take medicines to help you quit smoking. Some medicines require a prescription and some you can purchase over-the-counter. Medicines may have nicotine in them to replace the nicotine in cigarettes. Medicines may:  Help to stop cravings.  Help to relieve withdrawal symptoms. Your health care provider may recommend:  Nicotine patches, gum, or lozenges.  Nicotine inhalers or sprays.  Non-nicotine medicine that is taken by mouth. Find resources Find resources and support systems that can help you to quit smoking and remain smoke-free after you quit. These resources are  most helpful when you use them often. They include:  Online chats with a Social worker.  Telephone quitlines.  Printed Furniture conservator/restorer.  Support groups or group counseling.  Text messaging programs.  Mobile phone apps or applications. Use apps that can help you stick to your quit plan by providing reminders, tips, and encouragement. There are many free apps for mobile devices as well as websites. Examples include Quit Guide from the [employer] and smokefree.gov What things can I do to make it easier to quit?   Reach out to your family and friends for support and encouragement. Call telephone quitlines (1-800-QUIT-NOW), reach out to support groups, or work with a counselor for support.  Ask people who smoke to avoid smoking around you.  Avoid places that trigger you to smoke, such as bars, parties, or smoke-break areas at work.  Spend time with people who do not smoke.  Lessen the stress in your life. Stress can be a smoking trigger for some people. To lessen stress, try: ? Exercising regularly. ? Doing deep-breathing exercises. ? Doing yoga. ? Meditating. ? Performing a body scan. This involves closing your eyes, scanning your body from head to toe, and noticing which parts of your body are particularly tense. Try to relax the muscles in those areas. How will I feel when I quit smoking? Day 1 to 3 weeks Within the first 24 hours of quitting smoking, you may start to feel withdrawal symptoms. These symptoms are usually most noticeable 2-3 days after quitting, but they usually do not last for more than 2-3 weeks. You may experience these symptoms:  Mood swings.  Restlessness, anxiety, or irritability.  Trouble concentrating.  Dizziness.  Strong cravings for sugary foods and nicotine.  Mild weight gain.  Constipation.  Nausea.  Coughing or a sore throat.  Changes in how the medicines that you take for unrelated issues work in your body.  Depression.  Trouble sleeping  (insomnia). Week 3 and afterward After the first 2-3 weeks of quitting, you may start to notice more positive results, such as:  Improved sense of smell and taste.  Decreased coughing and sore throat.  Slower heart rate.  Lower blood pressure.  Clearer skin.  The ability to breathe more easily.  Fewer sick days. Quitting smoking can be very challenging. Do not get discouraged if you are not successful the first time. Some people need to make many attempts to quit before they achieve long-term success. Do your best to stick to your quit plan, and talk with your health care provider if you have any questions or concerns. Summary  Smoking tobacco is the leading cause of preventable death. Quitting smoking is one of the best things that you can do for your health.  When you decide to quit smoking, create a plan to help you succeed.  Quit smoking right away, not slowly over a period of time.  When you start quitting, seek help from your health care  provider, family, or friends. This information is not intended to replace advice given to you by your health care provider. Make sure you discuss any questions you have with your health care provider. Document Revised: 12/26/2018 Document Reviewed: 06/21/2018 Elsevier Patient Education  [place].  Steps to Quit Smoking Smoking tobacco is the leading cause of preventable death. It can affect almost every organ in the body. Smoking puts you and people around you at risk for many serious, long-lasting (chronic) diseases. Quitting smoking can be hard, but it is one of the best things that you can do for your health. It is never too late to quit. How do I get ready to quit? When you decide to quit smoking, make a plan to help you succeed. Before you quit:  Pick a date to quit. Set a date within the next 2 weeks to give you time to prepare.  Write down the reasons why you are quitting. Keep this list in places where you will see it  often.  Tell your family, friends, and co-workers that you are quitting. Their support is important.  Talk with your doctor about the choices that may help you quit.  Find out if your health insurance will pay for these treatments.  Know the people, places, things, and activities that make you want to smoke (triggers). Avoid them. What first steps can I take to quit smoking?  Throw away all cigarettes at home, at work, and in your car.  Throw away the things that you use when you smoke, such as ashtrays and lighters.  Clean your car. Make sure to empty the ashtray.  Clean your home, including curtains and carpets. What can I do to help me quit smoking? Talk with your doctor about taking medicines and seeing a counselor at the same time. You are more likely to succeed when you do both.  If you are pregnant or breastfeeding, talk with your doctor about counseling or other ways to quit smoking. Do not take medicine to help you quit smoking unless your doctor tells you to do so. To quit smoking: Quit right away  Quit smoking totally, instead of slowly cutting back on how much you smoke over a period of time.  Go to counseling. You are more likely to quit if you go to counseling sessions regularly. Take medicine You may take medicines to help you quit. Some medicines need a prescription, and some you can buy over-the-counter. Some medicines may contain a drug called nicotine to replace the nicotine in cigarettes. Medicines may:  Help you to stop having the desire to smoke (cravings).  Help to stop the problems that come when you stop smoking (withdrawal symptoms). Your doctor may ask you to use:  Nicotine patches, gum, or lozenges.  Nicotine inhalers or sprays.  Non-nicotine medicine that is taken by mouth. Find resources Find resources and other ways to help you quit smoking and remain smoke-free after you quit. These resources are most helpful when you use them often. They  include:  Online chats with a Social worker.  Phone quitlines.  Printed Furniture conservator/restorer.  Support groups or group counseling.  Text messaging programs.  Mobile phone apps. Use apps on your mobile phone or tablet that can help you stick to your quit plan. There are many free apps for mobile phones and tablets as well as websites. Examples include Quit Guide from the [employer] and smokefree.gov  What things can I do to make it easier to quit?  Talk to your family and friends. Ask them to support and encourage you.  Call a phone quitline (1-800-QUIT-NOW), reach out to support groups, or work with a Social worker.  Ask people who smoke to not smoke around you.  Avoid places that make you want to smoke, such as: ? Bars. ? Parties. ? Smoke-break areas at work.  Spend time with people who do not smoke.  Lower the stress in your life. Stress can make you want to smoke. Try these things to help your stress: ? Getting regular exercise. ? Doing deep-breathing exercises. ? Doing yoga. ? Meditating. ? Doing a body scan. To do this, close your eyes, focus on one area of your body at a time from head to toe. Notice which parts of your body are tense. Try to relax the muscles in those areas. How will I feel when I quit smoking? Day 1 to 3 weeks Within the first 24 hours, you may start to have some problems that come from quitting tobacco. These problems are very bad 2-3 days after you quit, but they do not often last for more than 2-3 weeks. You may get these symptoms:  Mood swings.  Feeling restless, nervous, angry, or annoyed.  Trouble concentrating.  Dizziness.  Strong desire for high-sugar foods and nicotine.  Weight gain.  Trouble pooping (constipation).  Feeling like you may vomit (nausea).  Coughing or a sore throat.  Changes in how the medicines that you take for other issues work in your body.  Depression.  Trouble sleeping (insomnia). Week 3 and afterward After the  first 2-3 weeks of quitting, you may start to notice more positive results, such as:  Better sense of smell and taste.  Less coughing and sore throat.  Slower heart rate.  Lower blood pressure.  Clearer skin.  Better breathing.  Fewer sick days. Quitting smoking can be hard. Do not give up if you fail the first time. Some people need to try a few times before they succeed. Do your best to stick to your quit plan, and talk with your doctor if you have any questions or concerns. Summary  Smoking tobacco is the leading cause of preventable death. Quitting smoking can be hard, but it is one of the best things that you can do for your health.  When you decide to quit smoking, make a plan to help you succeed.  Quit smoking right away, not slowly over a period of time.  When you start quitting, seek help from your doctor, family, or friends. This information is not intended to replace advice given to you by your health care provider. Make sure you discuss any questions you have with your health care provider. Document Revised: 12/26/2018 Document Reviewed: 06/21/2018 Elsevier Patient Education  [place] DASH stands for "Dietary Approaches to Stop Hypertension." The DASH eating plan is a healthy eating plan that has been shown to reduce high blood pressure (hypertension). It may also reduce your risk for type 2 diabetes, heart disease, and stroke. The DASH eating plan may also help with weight loss. What are tips for following this plan?  General guidelines  Avoid eating more than 2,300 mg (milligrams) of salt (sodium) a day. If you have hypertension, you may need to reduce your sodium intake to 1,500 mg a day.  Limit alcohol intake to no more than 1 drink a day for nonpregnant women and 2 drinks a day for men. One drink equals 12 oz of beer,  5 oz of wine, or 1 oz of hard liquor.  Work with your health care provider to maintain a healthy body weight or to  lose weight. Ask what an ideal weight is for you.  Get at least 30 minutes of exercise that causes your heart to beat faster (aerobic exercise) most days of the week. Activities may include walking, swimming, or biking.  Work with your health care provider or diet and nutrition specialist (dietitian) to adjust your eating plan to your individual calorie needs. Reading food labels   Check food labels for the amount of sodium per serving. Choose foods with less than 5 percent of the Daily Value of sodium. Generally, foods with less than 300 mg of sodium per serving fit into this eating plan.  To find whole grains, look for the word "whole" as the first word in the ingredient list. Shopping  Buy products labeled as "low-sodium" or "no salt added."  Buy fresh foods. Avoid canned foods and premade or frozen meals. Cooking  Avoid adding salt when cooking. Use salt-free seasonings or herbs instead of table salt or sea salt. Check with your health care provider or pharmacist before using salt substitutes.  Do not fry foods. Cook foods using healthy methods such as baking, boiling, grilling, and broiling instead.  Cook with heart-healthy oils, such as olive, canola, soybean, or sunflower oil. Meal planning  Eat a balanced diet that includes: ? 5 or more servings of fruits and vegetables each day. At each meal, try to fill half of your plate with fruits and vegetables. ? Up to 6-8 servings of whole grains each day. ? Less than 6 oz of lean meat, poultry, or fish each day. A 3-oz serving of meat is about the same size as a deck of cards. One egg equals 1 oz. ? 2 servings of low-fat dairy each day. ? A serving of nuts, seeds, or beans 5 times each week. ? Heart-healthy fats. Healthy fats called Omega-3 fatty acids are found in foods such as flaxseeds and coldwater fish, like sardines, salmon, and mackerel.  Limit how much you eat of the following: ? Canned or prepackaged foods. ? Food that is  high in trans fat, such as fried foods. ? Food that is high in saturated fat, such as fatty meat. ? Sweets, desserts, sugary drinks, and other foods with added sugar. ? Full-fat dairy products.  Do not salt foods before eating.  Try to eat at least 2 vegetarian meals each week.  Eat more home-cooked food and less restaurant, buffet, and fast food.  When eating at a restaurant, ask that your food be prepared with less salt or no salt, if possible. What foods are recommended? The items listed may not be a complete list. Talk with your dietitian about what dietary choices are best for you. Grains Whole-grain or whole-wheat bread. Whole-grain or whole-wheat pasta. Brown rice. [place]. Bulgur. Whole-grain and low-sodium cereals. Pita bread. Low-fat, low-sodium crackers. Whole-wheat flour tortillas. Vegetables Fresh or frozen vegetables (raw, steamed, roasted, or grilled). Low-sodium or reduced-sodium tomato and vegetable juice. Low-sodium or reduced-sodium tomato sauce and tomato paste. Low-sodium or reduced-sodium canned vegetables. Fruits All fresh, dried, or frozen fruit. Canned fruit in natural juice (without added sugar). Meat and other protein foods Skinless chicken or [place]. Ground chicken or [place]. Pork with fat trimmed off. Fish and seafood. Egg whites. Dried beans, peas, or lentils. Unsalted nuts, nut butters, and seeds. Unsalted canned beans. Lean cuts of beef with fat trimmed  off. Low-sodium, lean deli meat. Dairy Low-fat (1%) or fat-free (skim) milk. Fat-free, low-fat, or reduced-fat cheeses. Nonfat, low-sodium ricotta or cottage cheese. Low-fat or nonfat yogurt. Low-fat, low-sodium cheese. Fats and oils Soft margarine without trans fats. Vegetable oil. Low-fat, reduced-fat, or light mayonnaise and salad dressings (reduced-sodium). Canola, safflower, olive, soybean, and sunflower oils. Avocado. Seasoning and other foods Herbs. Spices. Seasoning mixes without salt.  Unsalted popcorn and pretzels. Fat-free sweets. What foods are not recommended? The items listed may not be a complete list. Talk with your dietitian about what dietary choices are best for you. Grains Baked goods made with fat, such as croissants, muffins, or some breads. Dry pasta or rice meal packs. Vegetables Creamed or fried vegetables. Vegetables in a cheese sauce. Regular canned vegetables (not low-sodium or reduced-sodium). Regular canned tomato sauce and paste (not low-sodium or reduced-sodium). Regular tomato and vegetable juice (not low-sodium or reduced-sodium). Angie Fava. Olives. Fruits Canned fruit in a light or heavy syrup. Fried fruit. Fruit in cream or butter sauce. Meat and other protein foods Fatty cuts of meat. Ribs. Fried meat. Berniece Salines. Sausage. Bologna and other processed lunch meats. Salami. Fatback. Hotdogs. Bratwurst. Salted nuts and seeds. Canned beans with added salt. Canned or smoked fish. Whole eggs or egg yolks. Chicken or [place] with skin. Dairy Whole or 2% milk, cream, and half-and-half. Whole or full-fat cream cheese. Whole-fat or sweetened yogurt. Full-fat cheese. Nondairy creamers. Whipped toppings. Processed cheese and cheese spreads. Fats and oils Butter. Stick margarine. Lard. Shortening. Ghee. Bacon fat. Tropical oils, such as coconut, palm kernel, or palm oil. Seasoning and other foods Salted popcorn and pretzels. Onion salt, garlic salt, seasoned salt, table salt, and sea salt. Worcestershire sauce. Tartar sauce. Barbecue sauce. Teriyaki sauce. Soy sauce, including reduced-sodium. Steak sauce. Canned and packaged gravies. Fish sauce. Oyster sauce. Cocktail sauce. Horseradish that you find on the shelf. Ketchup. Mustard. Meat flavorings and tenderizers. Bouillon cubes. Hot sauce and Tabasco sauce. Premade or packaged marinades. Premade or packaged taco seasonings. Relishes. Regular salad dressings. Where to find more information:  National Heart, Lung, and [place]: https://wilson-eaton.com/  American Heart Association: www.heart.org Summary  The DASH eating plan is a healthy eating plan that has been shown to reduce high blood pressure (hypertension). It may also reduce your risk for type 2 diabetes, heart disease, and stroke.  With the DASH eating plan, you should limit salt (sodium) intake to 2,300 mg a day. If you have hypertension, you may need to reduce your sodium intake to 1,500 mg a day.  When on the DASH eating plan, aim to eat more fresh fruits and vegetables, whole grains, lean proteins, low-fat dairy, and heart-healthy fats.  Work with your health care provider or diet and nutrition specialist (dietitian) to adjust your eating plan to your individual calorie needs. This information is not intended to replace advice given to you by your health care provider. Make sure you discuss any questions you have with your health care provider. Document Revised: 03/15/2017 Document Reviewed: 03/26/2016 Elsevier Patient Education  2020 [employer].

## 2020-01-25 ENCOUNTER — Telehealth: Payer: Self-pay | Admitting: Nurse Practitioner

## 2020-01-25 MED ORDER — HYDROCHLOROTHIAZIDE 12.5 MG PO CAPS: 12.5000 mg | ORAL_CAPSULE | Freq: Every day | ORAL | 0 refills | Status: DC

## 2020-01-25 NOTE — Telephone Encounter (Signed)
Please call him for home BP readings. If he has not been able to do those, please advise that I would like to add low dose HCTZ to his current amlodipine 5 mg regime. Do not stop the amlodipine.   Please keep OV 11/2 and I will check his BP and Bmet.

## 2020-01-26 NOTE — Telephone Encounter (Signed)
Patient has not started taking BP readings at home as he does not have a cuff at home. I advised he would be starting another rx along with his current medication and that the rx had already been sent in to [facility]. Patient agrees to keep OV on 11/2

## 2020-02-02 ENCOUNTER — Ambulatory Visit: Payer: Medicaid Other

## 2020-02-11 ENCOUNTER — Telehealth: Payer: Self-pay | Admitting: Nurse Practitioner

## 2020-02-11 ENCOUNTER — Ambulatory Visit (INDEPENDENT_AMBULATORY_CARE_PROVIDER_SITE_OTHER): Payer: Medicaid Other

## 2020-02-11 ENCOUNTER — Other Ambulatory Visit: Payer: Self-pay

## 2020-02-11 VITALS — BP 169/93 | HR 70

## 2020-02-11 DIAGNOSIS — I10 Essential (primary) hypertension: Secondary | ICD-10-CM | POA: Diagnosis not present

## 2020-02-11 NOTE — Telephone Encounter (Signed)
Plan:  1. He needs to come in on Friday for a CMet to check kidney function and calcium on the new start HCTZ 12.5 mg daily.  2. He can increase his amlodipine to 10 mg daily as his BP is not at goal.  3. Check the BP daily for 3 days on this increased dose and call in BP readings. 4. Follow up Nurse Visit for BP in 2 weeks.

## 2020-02-11 NOTE — Addendum Note (Signed)
Addended by: Denice Paradise A on: 02/11/2020 08:45 PM   Modules accepted: Orders

## 2020-02-11 NOTE — Progress Notes (Signed)
Patient is here for a BP check due to bp being high at home, as per patient.  Patient is currently taking two bp medication in the morning daily, amlodipine 5mg  and Microzide 12.5mg . Currently patients BP is 169/93 and BPM is 70.  Patient has no complaints of headaches, blurry vision, chest pain, arm pain, light headedness, dizziness, and nor jaw pain. Please see previous note for order, 01-19-20.

## 2020-02-11 NOTE — Progress Notes (Signed)
Plan:  1. He needs to come in on Friday for a CMet to check kidney function and calcium on the new start HCTZ 12.5 mg daily.  2. He can increase his amlodipine to 10 mg daily as his BP is not at goal.  3. Check the BP daily for 3 days on this increased dose and call in BP readings. 4. Follow up Nurse Visit for BP in 2 weeks.

## 2020-02-12 NOTE — Telephone Encounter (Signed)
Patient aware that he can increase amlodipine to 10mg  QD if needed. Patient is coming in next week 02/16/20 at 10:30 am to recheck BP; does not have BP cuff at home. Patient will scheduled [facility] for BP check when he comes in for follow up.

## 2020-02-15 ENCOUNTER — Other Ambulatory Visit: Payer: Self-pay | Admitting: Nurse Practitioner

## 2020-02-15 MED ORDER — AMLODIPINE BESYLATE 10 MG PO TABS: 10.0000 mg | ORAL_TABLET | Freq: Every day | ORAL | 1 refills | Status: DC

## 2020-02-15 NOTE — Telephone Encounter (Signed)
Pt also needs amLODipine (NORVASC) 5 MG tablet

## 2020-02-15 NOTE — Telephone Encounter (Signed)
Was the HCTZ sent to pharmacy refilled Amlodipine at 10 mg due to patient and insurance will refill early at regular dose.

## 2020-02-16 ENCOUNTER — Other Ambulatory Visit: Payer: Self-pay

## 2020-02-16 ENCOUNTER — Ambulatory Visit (INDEPENDENT_AMBULATORY_CARE_PROVIDER_SITE_OTHER): Payer: Medicaid Other | Admitting: Nurse Practitioner

## 2020-02-16 ENCOUNTER — Telehealth: Payer: Self-pay | Admitting: Nurse Practitioner

## 2020-02-16 ENCOUNTER — Encounter: Payer: Self-pay | Admitting: Nurse Practitioner

## 2020-02-16 VITALS — BP 170/98 | HR 70 | Temp 98.2°F | Ht 74.0 in | Wt 238.0 lb

## 2020-02-16 DIAGNOSIS — Z0000 Encounter for general adult medical examination without abnormal findings: Secondary | ICD-10-CM | POA: Diagnosis not present

## 2020-02-16 DIAGNOSIS — E669 Obesity, unspecified: Secondary | ICD-10-CM

## 2020-02-16 DIAGNOSIS — Z125 Encounter for screening for malignant neoplasm of prostate: Secondary | ICD-10-CM

## 2020-02-16 DIAGNOSIS — I10 Essential (primary) hypertension: Secondary | ICD-10-CM

## 2020-02-16 DIAGNOSIS — R7989 Other specified abnormal findings of blood chemistry: Secondary | ICD-10-CM | POA: Diagnosis not present

## 2020-02-16 LAB — CBC WITH DIFFERENTIAL/PLATELET
Basophils Absolute: 0.1 10*3/uL (ref 0.0–0.1)
Basophils Relative: 0.8 % (ref 0.0–3.0)
Eosinophils Absolute: 0.3 10*3/uL (ref 0.0–0.7)
Eosinophils Relative: 3.2 % (ref 0.0–5.0)
HCT: 48.1 % (ref 39.0–52.0)
Hemoglobin: 16.3 g/dL (ref 13.0–17.0)
Lymphocytes Relative: 21.2 % (ref 12.0–46.0)
Lymphs Abs: 1.9 10*3/uL (ref 0.7–4.0)
MCHC: 33.9 g/dL (ref 30.0–36.0)
MCV: 100.9 fl — ABNORMAL HIGH (ref 78.0–100.0)
Monocytes Absolute: 0.5 10*3/uL (ref 0.1–1.0)
Monocytes Relative: 5.5 % (ref 3.0–12.0)
Neutro Abs: 6.4 10*3/uL (ref 1.4–7.7)
Neutrophils Relative %: 69.3 % (ref 43.0–77.0)
Platelets: 197 10*3/uL (ref 150.0–400.0)
RBC: 4.77 Mil/uL (ref 4.22–5.81)
RDW: 13.6 % (ref 11.5–15.5)
WBC: 9.2 10*3/uL (ref 4.0–10.5)

## 2020-02-16 LAB — COMPREHENSIVE METABOLIC PANEL
ALT: 17 U/L (ref 0–53)
AST: 15 U/L (ref 0–37)
Albumin: 4.6 g/dL (ref 3.5–5.2)
Alkaline Phosphatase: 79 U/L (ref 39–117)
BUN: 15 mg/dL (ref 6–23)
CO2: 30 mEq/L (ref 19–32)
Calcium: 11.7 mg/dL — ABNORMAL HIGH (ref 8.4–10.5)
Chloride: 104 mEq/L (ref 96–112)
Creatinine, Ser: 1.42 mg/dL (ref 0.40–1.50)
GFR: 54.57 mL/min — ABNORMAL LOW (ref >60.00)
Glucose, Bld: 88 mg/dL (ref 70–99)
Potassium: 4.3 mEq/L (ref 3.5–5.1)
Sodium: 141 mEq/L (ref 135–145)
Total Bilirubin: 0.8 mg/dL (ref 0.2–1.2)
Total Protein: 7.6 g/dL (ref 6.0–8.3)

## 2020-02-16 LAB — TSH: TSH: 0.67 u[IU]/mL (ref 0.35–4.50)

## 2020-02-16 LAB — LIPID PANEL
Cholesterol: 181 mg/dL (ref 0–200)
HDL: 43.3 mg/dL (ref >39.00)
LDL Cholesterol: 108 mg/dL — ABNORMAL HIGH (ref 0–99)
NonHDL: 137.32
Total CHOL/HDL Ratio: 4
Triglycerides: 147 mg/dL (ref 0.0–149.0)
VLDL: 29.4 mg/dL (ref 0.0–40.0)

## 2020-02-16 LAB — PSA: PSA: 6.84 ng/mL — ABNORMAL HIGH (ref 0.10–4.00)

## 2020-02-16 LAB — MICROALBUMIN / CREATININE URINE RATIO
Creatinine,U: 180.9 mg/dL
Microalb Creat Ratio: 8.7 mg/g (ref 0.0–30.0)
Microalb, Ur: 15.8 mg/dL — ABNORMAL HIGH (ref 0.0–1.9)

## 2020-02-16 LAB — HEMOGLOBIN A1C: Hgb A1c MFr Bld: 4.9 % (ref 4.6–6.5)

## 2020-02-16 MED ORDER — LOSARTAN POTASSIUM 50 MG PO TABS: 50.0000 mg | ORAL_TABLET | Freq: Every day | ORAL | 3 refills | Status: DC

## 2020-02-16 NOTE — Telephone Encounter (Signed)
Pt girlfriend would like a call back about his BP medication

## 2020-02-16 NOTE — Telephone Encounter (Signed)
Kim states patient should be stop taking HCTZ and stay on amlodipine. Will also add cozar 50 mg; needs rxs sent to pharmacy. Patient is scheduled to come in for labs on 02/24/20 at 10:30 am.

## 2020-02-16 NOTE — Progress Notes (Signed)
Established Patient Office Visit  Subjective:  Patient ID: Todd Craig, male    DOB: [birth year]  Age: 59 y.o. MRN: [id number]  CC:  Chief Complaint  Patient presents with  . Follow-up    hypertension    HPI Todd Craig presents for follow-up of hypertension.  He has not taken his amlodipine blood pressure medicine today.  Blood pressure is elevated 170/98.  He has amlodipine 10 mg daily.  He began HCTZ 12.5 mg  last month. Serum calcium is rising.  He feels well and has CP/dizziness/edema/change in mentation. He comes in with his fiance. He does not have a BP cuff at home.   Past Medical History:  Diagnosis Date  . GERD (gastroesophageal reflux disease)   . Hypertension   . Stroke [facility])     Past Surgical History:  Procedure Laterality Date  . right hand repair  1985   trauma with saw laceration     History reviewed. No pertinent family history.  Social History   Socioeconomic History  . Marital status: Single    Spouse name: Not on file  . Number of children: Not on file  . Years of education: Not on file  . Highest education level: Not on file  Occupational History  . Not on file  Tobacco Use  . Smoking status: Current Every Day Smoker    Packs/day: 1.00    Types: Cigarettes  . Smokeless tobacco: Never Used  Vaping Use  . Vaping Use: Never used  Substance and Sexual Activity  . Alcohol use: Yes    Alcohol/week: 5.0 standard drinks    Types: 5 Cans of beer per week  . Drug use: No  . Sexual activity: Yes  Other Topics Concern  . Not on file  Social History Narrative  . Not on file   Social Determinants of Health   Financial Resource Strain:   . Difficulty of Paying Living Expenses: Not on file  Food Insecurity:   . Worried About Charity fundraiser in the Last Year: Not on file  . Ran Out of Food in the Last Year: Not on file  Transportation Needs:   . Lack of Transportation (Medical): Not on file  . Lack of Transportation (Non-Medical): Not on file   Physical Activity:   . Days of Exercise per Week: Not on file  . Minutes of Exercise per Session: Not on file  Stress:   . Feeling of Stress : Not on file  Social Connections:   . Frequency of Communication with Friends and Family: Not on file  . Frequency of Social Gatherings with Friends and Family: Not on file  . Attends Religious Services: Not on file  . Active Member of Clubs or Organizations: Not on file  . Attends Archivist Meetings: Not on file  . Marital Status: Not on file  Intimate Partner Violence:   . Fear of Current or Ex-Partner: Not on file  . Emotionally Abused: Not on file  . Physically Abused: Not on file  . Sexually Abused: Not on file    Outpatient Medications Prior to Visit  Medication Sig Dispense Refill  . amLODipine (NORVASC) 10 MG tablet Take 1 tablet (10 mg total) by mouth daily. 30 tablet 1  . hydrochlorothiazide (MICROZIDE) 12.5 MG capsule Take 1 capsule by mouth once daily 30 capsule 0   No facility-administered medications prior to visit.    Allergies  Allergen Reactions  . Tramadol     Other reaction(s): Other  Review of Systems Pertinent positives noted in history of present illness and otherwise negative.   Objective:    Physical Exam Vitals reviewed.  Constitutional:      Appearance: He is normal weight.  Eyes:     Conjunctiva/sclera: Conjunctivae normal.     Pupils: Pupils are equal, round, and reactive to light.  Cardiovascular:     Rate and Rhythm: Normal rate and regular rhythm.     Pulses: Normal pulses.     Heart sounds: Normal heart sounds.  Pulmonary:     Effort: Pulmonary effort is normal.     Breath sounds: Normal breath sounds.  Abdominal:     Palpations: Abdomen is soft.     Tenderness: There is no abdominal tenderness.  Musculoskeletal:        General: Normal range of motion.     Cervical back: Normal range of motion and neck supple.  Skin:    General: Skin is warm and dry.  Neurological:      General: No focal deficit present.     Mental Status: He is alert and oriented to person, place, and time.  Psychiatric:        Mood and Affect: Mood normal.        Behavior: Behavior normal.     BP (!) 170/98 (BP Location: Left Arm, Patient Position: Sitting, Cuff Size: Normal)   Pulse 70   Temp 98.2 F (36.8 C) (Oral)   Ht 6\' 2"  (1.88 m)   Wt 238 lb (108 kg)   SpO2 97%   BMI 30.56 kg/m  Wt Readings from Last 3 Encounters:  02/16/20 238 lb (108 kg)  01/19/20 232 lb (105.2 kg)  01/11/20 231 lb (104.8 kg)   Pulse Readings from Last 3 Encounters:  02/16/20 70  02/11/20 70  01/19/20 81    BP Readings from Last 3 Encounters:  02/16/20 (!) 170/98  02/11/20 (!) 169/93  01/19/20 (!) 150/100    Lab Results  Component Value Date   CHOL 181 02/16/2020   HDL 43.30 02/16/2020   LDLCALC 108 (H) 02/16/2020   TRIG 147.0 02/16/2020   CHOLHDL 4 02/16/2020      Health Maintenance Due  Topic Date Due  . TETANUS/TDAP  Never done  . COLONOSCOPY  Never done    There are no preventive care reminders to display for this patient.  Lab Results  Component Value Date   TSH 0.67 02/16/2020   Lab Results  Component Value Date   WBC 9.2 02/16/2020   HGB 16.3 02/16/2020   HCT 48.1 02/16/2020   MCV 100.9 (H) 02/16/2020   PLT 197.0 02/16/2020   Lab Results  Component Value Date   NA 141 02/16/2020   K 4.3 02/16/2020   CO2 30 02/16/2020   GLUCOSE 88 02/16/2020   BUN 15 02/16/2020   CREATININE 1.42 02/16/2020   BILITOT 0.8 02/16/2020   ALKPHOS 79 02/16/2020   AST 15 02/16/2020   ALT 17 02/16/2020   PROT 7.6 02/16/2020   ALBUMIN 4.6 02/16/2020   CALCIUM 11.7 (H) 02/16/2020   ANIONGAP 8 01/11/2020   GFR 54.57 (L) 02/16/2020   Lab Results  Component Value Date   CHOL 181 02/16/2020   Lab Results  Component Value Date   HDL 43.30 02/16/2020   Lab Results  Component Value Date   LDLCALC 108 (H) 02/16/2020   Lab Results  Component Value Date   TRIG 147.0 02/16/2020    Lab Results  Component Value Date  CHOLHDL 4 02/16/2020   Lab Results  Component Value Date   HGBA1C 4.9 02/16/2020      Assessment & Plan:   Problem List Items Addressed This Visit      Cardiovascular and Mediastinum   Primary hypertension - Primary   Relevant Medications   losartan (COZAAR) 50 MG tablet   Other Relevant Orders   Comprehensive metabolic panel (Completed)   Microalbumin / creatinine urine ratio (Completed)     Other   Obesity, Class I, BMI 30-34.9   Relevant Orders   TSH (Completed)   Hemoglobin A1c (Completed)   Lipid panel (Completed)   Elevated serum creatinine   Relevant Orders   Comprehensive metabolic panel (Completed)   Serum calcium elevated   Relevant Orders   Comprehensive metabolic panel (Completed)   PTH, intact (no Ca)   VITAMIN D 25 Hydroxy (Vit-D Deficiency, Fractures) (Completed)   Calcium, ionized   Preventative health care   Relevant Orders   CBC with Differential/Platelet (Completed)   Hepatitis C antibody (Completed)   HIV Antibody (routine testing w rflx) (Completed)   PSA (Completed)      Meds ordered this encounter  Medications  . losartan (COZAAR) 50 MG tablet    Sig: Take 1 tablet (50 mg total) by mouth daily.    Dispense:  90 tablet    Refill:  3    Order Specific Question:   Supervising Provider    Answer:   Einar Pheasant [[id number]]   Pt advised:  Your BP is not at goal and will check labs and make further medication recommendation.   Recommend routine dental care.  You can try Dr. Marolyn Haller [phone].  Recommend routine eye care especially with history of high blood pressure need to make sure that there is no eye damage. Please make appointment at [facility].  Recommend colon cancer screening and we discussed options today with Cologuard and colonoscopy.  You are going to consider which option you would prefer.  Recommend routine vaccines to include the flu shot, tetanus and shingles  vaccine. You decline all vaccines today because of needles.   Addendum:  He began HCTZ 12.5 mg  last month. Serum calcium is rising.Hold the HCTZ. Add losartan 50 mg daily. Continue Amlodipine 10 mg daily.  Labs in 1 week.    Risks, benefits, and alternatives of the medications and treatment plan prescribed today were discussed, and patient expressed understanding.   Follow-up: Return in about 3 months (around 05/18/2020) for cpe.   This visit occurred during the SARS-CoV-2 public health emergency.  Safety protocols were in place, including screening questions prior to the visit, additional usage of staff PPE, and extensive cleaning of exam room while observing appropriate contact time as indicated for disinfecting solutions.   Denice Paradise, NP

## 2020-02-16 NOTE — Patient Instructions (Addendum)
To the lab today.   Your BP is not at goal and will check labs and make further medication recommendation.   Recommend routine dental care.  You can try Dr. Marolyn Haller [phone].  Recommend routine eye care especially with history of high blood pressure need to make sure that there is no eye damage. Please make appointment at [facility].  Recommend colon cancer screening and we discussed options today with Cologuard and colonoscopy.  You are going to consider which option you would prefer.  Recommend routine vaccines to include the flu shot, tetanus and shingles vaccine. You decline all vaccines today because of needles.   Please return in 3 mos.

## 2020-02-16 NOTE — Telephone Encounter (Signed)
They want clarification about what meds he is supposed to be taking. Please advise

## 2020-02-17 ENCOUNTER — Other Ambulatory Visit: Payer: Medicaid Other

## 2020-02-17 LAB — HEPATITIS C ANTIBODY
Hepatitis C Ab: NONREACTIVE
SIGNAL TO CUT-OFF: 0.02 (ref <1.00)

## 2020-02-17 LAB — VITAMIN D 25 HYDROXY (VIT D DEFICIENCY, FRACTURES): VITD: 11.98 ng/mL — ABNORMAL LOW (ref 30.00–100.00)

## 2020-02-17 LAB — HIV ANTIBODY (ROUTINE TESTING W REFLEX): HIV 1&2 Ab, 4th Generation: NONREACTIVE

## 2020-02-17 NOTE — Telephone Encounter (Signed)
Jessica:  He needs Lab appt  on 02/24/2020.  He needs nurse visit scheduled that day to check BP on new start Losartan 50 mg plus amlodipine.   He will also need OV 03/08/20 with me arranged

## 2020-02-17 NOTE — Telephone Encounter (Signed)
Patient needs a follow up appt scheduled; can you please schedule this patient an appt

## 2020-02-17 NOTE — Addendum Note (Signed)
Addended by: Denice Paradise A on: 02/17/2020 11:02 AM   Modules accepted: Orders

## 2020-02-24 ENCOUNTER — Ambulatory Visit: Payer: Medicaid Other

## 2020-02-25 ENCOUNTER — Other Ambulatory Visit: Payer: Self-pay

## 2020-02-25 ENCOUNTER — Ambulatory Visit: Payer: Medicaid Other

## 2020-02-25 ENCOUNTER — Other Ambulatory Visit (INDEPENDENT_AMBULATORY_CARE_PROVIDER_SITE_OTHER): Payer: Medicaid Other

## 2020-02-25 VITALS — BP 134/84 | HR 73

## 2020-02-25 DIAGNOSIS — E559 Vitamin D deficiency, unspecified: Secondary | ICD-10-CM

## 2020-02-25 DIAGNOSIS — I10 Essential (primary) hypertension: Secondary | ICD-10-CM

## 2020-02-25 DIAGNOSIS — E349 Endocrine disorder, unspecified: Secondary | ICD-10-CM

## 2020-02-25 LAB — COMPREHENSIVE METABOLIC PANEL
ALT: 22 U/L (ref 0–53)
AST: 20 U/L (ref 0–37)
Albumin: 4.4 g/dL (ref 3.5–5.2)
Alkaline Phosphatase: 74 U/L (ref 39–117)
BUN: 16 mg/dL (ref 6–23)
CO2: 29 mEq/L (ref 19–32)
Calcium: 11.1 mg/dL — ABNORMAL HIGH (ref 8.4–10.5)
Chloride: 105 mEq/L (ref 96–112)
Creatinine, Ser: 1.41 mg/dL (ref 0.40–1.50)
GFR: 55.03 mL/min — ABNORMAL LOW (ref >60.00)
Glucose, Bld: 94 mg/dL (ref 70–99)
Potassium: 4.3 mEq/L (ref 3.5–5.1)
Sodium: 139 mEq/L (ref 135–145)
Total Bilirubin: 1 mg/dL (ref 0.2–1.2)
Total Protein: 7 g/dL (ref 6.0–8.3)

## 2020-02-25 LAB — CALCIUM, IONIZED

## 2020-02-25 LAB — PARATHYROID HORMONE, INTACT (NO CA): PTH: 37 pg/mL (ref 14–64)

## 2020-02-25 NOTE — Progress Notes (Signed)
Patient is here for a BP check due to bp being high at last visit, as per patient.  Currently patients BP is 134/84 and BPM is 73.  Patient has no complaints of headaches, blurry vision, chest pain, arm pain, light headedness, dizziness, and nor jaw pain. Please see previous note for order.

## 2020-02-28 NOTE — Addendum Note (Signed)
Addended by: Denice Paradise A on: 02/28/2020 07:17 PM   Modules accepted: Orders

## 2020-02-29 LAB — PROTEIN ELECTROPHORESIS, SERUM
Albumin ELP: 4.3 g/dL (ref 3.8–4.8)
Alpha 1: 0.3 g/dL (ref 0.2–0.3)
Alpha 2: 0.8 g/dL (ref 0.5–0.9)
Beta 2: 0.3 g/dL (ref 0.2–0.5)
Beta Globulin: 0.5 g/dL (ref 0.4–0.6)
Gamma Globulin: 1.1 g/dL (ref 0.8–1.7)
Total Protein: 7.4 g/dL (ref 6.1–8.1)

## 2020-02-29 LAB — VITAMIN D 25 HYDROXY (VIT D DEFICIENCY, FRACTURES): Vit D, 25-Hydroxy: 15 ng/mL — ABNORMAL LOW (ref 30–100)

## 2020-02-29 LAB — PARATHYROID HORMONE, INTACT (NO CA): PTH: 121 pg/mL — ABNORMAL HIGH (ref 14–64)

## 2020-02-29 LAB — CALCIUM, IONIZED: Calcium, Ion: 6.16 mg/dL — ABNORMAL HIGH (ref 4.8–5.6)

## 2020-02-29 LAB — PHOSPHORUS: Phosphorus: 2.7 mg/dL (ref 2.5–4.5)

## 2020-02-29 MED ORDER — VITAMIN D 50 MCG (2000 UT) PO TABS: 4000.0000 [IU] | ORAL_TABLET | Freq: Every day | ORAL | 1 refills | Status: DC

## 2020-02-29 NOTE — Addendum Note (Signed)
Addended by: Denice Paradise A on: 02/29/2020 08:51 AM   Modules accepted: Orders

## 2020-03-02 ENCOUNTER — Other Ambulatory Visit: Payer: Self-pay

## 2020-03-08 ENCOUNTER — Ambulatory Visit (INDEPENDENT_AMBULATORY_CARE_PROVIDER_SITE_OTHER): Payer: Medicaid Other | Admitting: Nurse Practitioner

## 2020-03-08 ENCOUNTER — Encounter: Payer: Self-pay | Admitting: Nurse Practitioner

## 2020-03-08 ENCOUNTER — Other Ambulatory Visit: Payer: Self-pay

## 2020-03-08 VITALS — BP 140/100 | HR 64 | Temp 98.2°F | Ht 74.0 in | Wt 239.0 lb

## 2020-03-08 DIAGNOSIS — R809 Proteinuria, unspecified: Secondary | ICD-10-CM | POA: Diagnosis not present

## 2020-03-08 DIAGNOSIS — E7800 Pure hypercholesterolemia, unspecified: Secondary | ICD-10-CM | POA: Diagnosis not present

## 2020-03-08 DIAGNOSIS — R7989 Other specified abnormal findings of blood chemistry: Secondary | ICD-10-CM

## 2020-03-08 DIAGNOSIS — I10 Essential (primary) hypertension: Secondary | ICD-10-CM | POA: Diagnosis not present

## 2020-03-08 DIAGNOSIS — N1831 Chronic kidney disease, stage 3a: Secondary | ICD-10-CM

## 2020-03-08 DIAGNOSIS — E213 Hyperparathyroidism, unspecified: Secondary | ICD-10-CM

## 2020-03-08 DIAGNOSIS — R972 Elevated prostate specific antigen [PSA]: Secondary | ICD-10-CM | POA: Diagnosis not present

## 2020-03-08 NOTE — Progress Notes (Signed)
Established Patient Office Visit  Subjective:  Patient ID: Todd Craig, male    DOB: [birth year]  Age: 59 y.o. 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  His blood pressure was 184/130 associated with headache, blurred vision, and fatigue.  He was referred to the emergency room and was given amlodipine 5 mg daily.  C-Met showed creatinine 1.46, GFR 52 mL/min, calcium 11.3  EKG showed left ventricular hypertrophy grossly unchanged from EKG 2012.   He presents today with his fiance, [name] who keeps up with his medications and doctor visits. He is here for a follow-up of hypertension, new diagnosis of Vit D deficiency and hyperparathyroidism, and elevated screening PSA, newly Dx HLD, CKD.   HTN/LVH per EKG: Not at goal: <130/80. He is taking the amlodipine 10 mg daily, losartan 50 mg daily (new start 02/16/2020), and trial of HCTZ 12.5 mg with rising calcium and HCTZ discontinued. He does not check blood pressure at home.  He reports he feels well on the medication without edema, chest pain, shortness of breath, dizziness or lightheadedness. He is really working on lifestyle changes.   Positive microabuminuria/CKD 3a: His Cr before Losartan 1.46 and after Losartan 1.42 and 1.41- stable with GFR 54.57-55.03. Uncontrolled HTN, elevated PTH. Consider renal consult at follow up.   Lab Results  Component Value Date   CREATININE 1.41 02/25/2020    BP Readings from Last 3 Encounters:  03/08/20 (!) 140/100  02/25/20 134/84  02/16/20 (!) 170/98   Lab Results  Component Value Date   MICROALBUR 15.8 (H) 02/16/2020   Low vitamin D at 15, PTH initially 37 and PTH increased to 12 with ionized calcium  elevated 6.16.  Advised from Dr. Pablo Ledger in Endocrinology to replace vitamin D first at 4000 IU daily and no recommendation for hyperparathyroid scanning at this time. He has transportation problems and an appointment with Dr. Gabriel Carina for local endocrinology to follow. He has not yet started Vit D supplement-cannot afford the $12 over-the-counter medication until December 1.  HLD: He is not on a statin. LDL is not at goal with hx of CVA<70. Normal LFT and Hgb A1c. Working on diet, cut back alcohol  and one problem at a time. He will benefit from a referral to CCM and medication assistance.   The 10-year ASCVD risk score Mikey Bussing DC Brooke Bonito., et al., 2013) is: 24.1%   Values used to calculate the score:     Age: 27 years     Sex: Male     Is Non-Hispanic African American: Yes     Diabetic: No     Tobacco smoker: Yes     Systolic Blood Pressure: 409 mmHg     Is BP treated: Yes     HDL Cholesterol: 43.3 mg/dL     Total Cholesterol: 181 mg/dL   Lab Results  Component Value Date   CHOL 181 02/16/2020   HDL 43.30 02/16/2020   LDLCALC 108 (H) 02/16/2020   TRIG 147.0 02/16/2020   CHOLHDL 4 02/16/2020   Daily Alcohol/tobacco: He has cut his daily alcohol down to just a beer every now and then. Tobacco is still 4 per day- has not tried nicotine gum. He  will try to cut back further.  BMI 30/Obesity:  He reports he is eating healthier and hopes to lose wt.  Wt Readings from Last 3 Encounters:  03/08/20 239 lb (108.4 kg)  02/16/20 238 lb (108 kg)  01/19/20 232 lb (105.2 kg)   Health Maintenance:  Immunizations: Received the Covid vaccine 9/16 and 01/22/2020 Pfizer.need the tetanus vaccine- you plan do on  next visit. Colonoscopy: Advised Cologuard vs colonoscopy declines them both  Vision: He has an eye appointment arranged for March 2022.  Dentist:  He saw his dentist, Dr. Osvaldo Angst and plans to have dental work started next week. Screening PSA elevated:  6.84 and no baseline to compare. He denies prostate  problems. Agrees to Urology referral.   Past Medical History:  Diagnosis Date   GERD (gastroesophageal reflux disease)    Hypertension    Hypertensive crisis, unspecified 01/11/2020   Stroke [facility])     Past Surgical History:  Procedure Laterality Date   right hand repair  1985   trauma with saw laceration     History reviewed. No pertinent family history.  Social History   Socioeconomic History   Marital status: Single    Spouse name: Not on file   Number of children: Not on file   Years of education: Not on file   Highest education level: Not on file  Occupational History   Not on file  Tobacco Use   Smoking status: Current Every Day Smoker    Packs/day: 1.00    Types: Cigarettes   Smokeless tobacco: Never Used  Vaping Use   Vaping Use: Never used  Substance and Sexual Activity   Alcohol use: Yes    Alcohol/week: 5.0 standard drinks    Types: 5 Cans of beer per week   Drug use: No   Sexual activity: Yes  Other Topics Concern   Not on file  Social History Narrative   Not on file   Social Determinants of Health   Financial Resource Strain:    Difficulty of Paying Living Expenses: Not on file  Food Insecurity:    Worried About [place] in the Last Year: Not on file   Ran Out of Food in the Last Year: Not on file  Transportation Needs:    Lack of Transportation (Medical): Not on file   Lack of Transportation (Non-Medical): Not on file  Physical Activity:    Days of Exercise per Week: Not on file   Minutes of Exercise per Session: Not on file  Stress:    Feeling of Stress : Not on file  Social Connections:    Frequency of Communication with Friends and Family: Not on file   Frequency of Social Gatherings with Friends and Family: Not on file   Attends Religious Services: Not on file   Active Member of Clubs or Organizations: Not on file   Attends Archivist Meetings: Not on file   Marital Status: Not on  file  Intimate Partner Violence:    Fear of Current or Ex-Partner: Not on file   Emotionally Abused: Not on file   Physically Abused: Not on file   Sexually Abused: Not on file    Outpatient Medications Prior to Visit  Medication Sig Dispense Refill   amLODipine (NORVASC) 10 MG tablet Take 1 tablet (10 mg total) by mouth daily. 30 tablet 1   losartan (COZAAR) 50 MG tablet Take 1 tablet (50 mg total) by mouth daily. 90 tablet 3   Cholecalciferol (  VITAMIN D) 50 MCG (2000 UT) tablet Take 2 tablets (4,000 Units total) by mouth daily. (Patient not taking: Reported on 03/08/2020) 180 tablet 1   No facility-administered medications prior to visit.    Allergies  Allergen Reactions   Tramadol     Other reaction(s): Other    Review of Systems  Constitutional: Negative for chills and fever.  HENT: Negative.   Respiratory: Negative.   Cardiovascular: Negative.   Genitourinary: Negative.   Neurological: Negative for dizziness, light-headedness and headaches.       Chronic memory problems after CVA.       Objective:    Physical Exam Vitals reviewed.  Constitutional:      Appearance: He is normal weight.  HENT:     Head: Normocephalic.  Cardiovascular:     Rate and Rhythm: Normal rate and regular rhythm.     Pulses: Normal pulses.     Heart sounds: Normal heart sounds.  Pulmonary:     Effort: Pulmonary effort is normal.     Breath sounds: Normal breath sounds.  Musculoskeletal:     Cervical back: Normal range of motion and neck supple.     Right lower leg: No edema.     Left lower leg: No edema.  Skin:    General: Skin is warm and dry.  Neurological:     Mental Status: He is oriented to person, place, and time. Mental status is at baseline.  Psychiatric:        Mood and Affect: Mood normal.        Behavior: Behavior normal.     BP (!) 140/100 (BP Location: Left Arm, Patient Position: Sitting, Cuff Size: Normal)    Pulse 64    Temp 98.2 F (36.8 C) (Oral)    Ht  _0  (1.88 m)    Wt 239 lb (108.4 kg)    SpO2 99%    BMI 30.69 kg/m  Wt Readings from Last 3 Encounters:  03/08/20 239 lb (108.4 kg)  02/16/20 238 lb (108 kg)  01/19/20 232 lb (105.2 kg)   Pulse Readings from Last 3 Encounters:  03/08/20 64  02/25/20 73  02/16/20 70    BP Readings from Last 3 Encounters:  03/08/20 (!) 140/100  02/25/20 134/84  02/16/20 (!) 170/98    Lab Results  Component Value Date   CHOL 181 02/16/2020   HDL 43.30 02/16/2020   LDLCALC 108 (H) 02/16/2020   TRIG 147.0 02/16/2020   CHOLHDL 4 02/16/2020      Health Maintenance Due  Topic Date Due   TETANUS/TDAP  Never done   COLONOSCOPY  Never done    There are no preventive care reminders to display for this patient.  Lab Results  Component Value Date   TSH 0.67 02/16/2020   Lab Results  Component Value Date   WBC 9.2 02/16/2020   HGB 16.3 02/16/2020   HCT 48.1 02/16/2020   MCV 100.9 (H) 02/16/2020   PLT 197.0 02/16/2020   Lab Results  Component Value Date   NA 139 02/25/2020   K 4.3 02/25/2020   CO2 29 02/25/2020   GLUCOSE 94 02/25/2020   BUN 16 02/25/2020   CREATININE 1.41 02/25/2020   BILITOT 1.0 02/25/2020   ALKPHOS 74 02/25/2020   AST 20 02/25/2020   ALT 22 02/25/2020   PROT 7.4 02/25/2020   PROT 7.0 02/25/2020   ALBUMIN 4.4 02/25/2020   CALCIUM 11.1 (H) 02/25/2020   ANIONGAP 8 01/11/2020   GFR 55.03 (L)  02/25/2020   Lab Results  Component Value Date   CHOL 181 02/16/2020   Lab Results  Component Value Date   HDL 43.30 02/16/2020   Lab Results  Component Value Date   LDLCALC 108 (H) 02/16/2020   Lab Results  Component Value Date   TRIG 147.0 02/16/2020   Lab Results  Component Value Date   CHOLHDL 4 02/16/2020   Lab Results  Component Value Date   HGBA1C 4.9 02/16/2020      Assessment & Plan:   Problem List Items Addressed This Visit      Cardiovascular and Mediastinum   Primary hypertension - Primary     Endocrine   Hyperparathyroidism ([place])       Genitourinary   Stage 3a chronic kidney disease ([place])     Other   Hypercalcemia   Elevated PSA   Relevant Orders   Ambulatory referral to Urology   Low vitamin D level   Microalbuminuria   Pure hypercholesterolemia     Consider referral to CCM to help with multiple medications and medication cost.  Consider Nephrology referral.  Needs to start on a statin for hx of CVA and ASCVD risk 24 % 10 year risk will advise. He has wanted to work oh lifestyle and reports good changes- primarily not drinking alcohol as much.   Advised: Remain on the amlodipine 10 mg daily and the losartan 50 mg daily. Great job on cutting back alcohol and tobacco and eating healthier!  Come in for nurse visit to check BP in 4 weeks.   May give Tetanus - TDAP at nurse visit  Office visit to see your primary care provider in 3 months.   Vit D 3 - 4000 IU daily or may take 5000 IU daily if more affordable. See ENDOCRINOLOGY as planned.   Your prostate cancer screening test was elevated- referral placed to Urology.   You  still need colon cancer screening- keep thinking on that.   Follow-up: Return in about 3 months (around 06/08/2020).   This visit occurred during the SARS-CoV-2 public health emergency.  Safety protocols were in place, including screening questions prior to the visit, additional usage of staff PPE, and extensive cleaning of exam room while observing appropriate contact time as indicated for disinfecting solutions.   Denice Paradise, NP

## 2020-03-08 NOTE — Patient Instructions (Addendum)
Stay on the amlodipine 10 mg daily and the losartan 50 mg daily. Great job on cutting back alcohol and tobacco and eating healthier!  Come in for nurse visit to check BP in 4 weeks. May give Tetanus - TDAP at nurse visit  Office visit to see your primary care provider in 3 months.   Vit D 3 - 4000 IU daily or may take 5000 IU daily if more affordable. See ENDOCRINOLOGY as planned.   Your prostate cancer screening test was elevated- referral placed to Urology.   You  still need colon cancer screening- keep thinking on that.  You need the tetanus vaccine- you plan do on  next visit.

## 2020-03-10 ENCOUNTER — Encounter: Payer: Self-pay | Admitting: Nurse Practitioner

## 2020-03-10 DIAGNOSIS — N1831 Chronic kidney disease, stage 3a: Secondary | ICD-10-CM | POA: Insufficient documentation

## 2020-03-10 DIAGNOSIS — R7989 Other specified abnormal findings of blood chemistry: Secondary | ICD-10-CM | POA: Insufficient documentation

## 2020-03-10 DIAGNOSIS — R972 Elevated prostate specific antigen [PSA]: Secondary | ICD-10-CM | POA: Insufficient documentation

## 2020-03-10 DIAGNOSIS — E213 Hyperparathyroidism, unspecified: Secondary | ICD-10-CM | POA: Insufficient documentation

## 2020-03-10 DIAGNOSIS — R801 Persistent proteinuria, unspecified: Secondary | ICD-10-CM | POA: Insufficient documentation

## 2020-03-10 DIAGNOSIS — E559 Vitamin D deficiency, unspecified: Secondary | ICD-10-CM | POA: Insufficient documentation

## 2020-03-10 DIAGNOSIS — R809 Proteinuria, unspecified: Secondary | ICD-10-CM | POA: Insufficient documentation

## 2020-03-10 DIAGNOSIS — E7800 Pure hypercholesterolemia, unspecified: Secondary | ICD-10-CM | POA: Insufficient documentation

## 2020-03-10 DIAGNOSIS — E210 Primary hyperparathyroidism: Secondary | ICD-10-CM | POA: Insufficient documentation

## 2020-03-14 ENCOUNTER — Telehealth: Payer: Self-pay | Admitting: Nurse Practitioner

## 2020-03-14 DIAGNOSIS — E213 Hyperparathyroidism, unspecified: Secondary | ICD-10-CM

## 2020-03-14 DIAGNOSIS — N1831 Chronic kidney disease, stage 3a: Secondary | ICD-10-CM

## 2020-03-14 DIAGNOSIS — R809 Proteinuria, unspecified: Secondary | ICD-10-CM

## 2020-03-14 DIAGNOSIS — I10 Essential (primary) hypertension: Secondary | ICD-10-CM

## 2020-03-14 DIAGNOSIS — E7800 Pure hypercholesterolemia, unspecified: Secondary | ICD-10-CM

## 2020-03-14 DIAGNOSIS — R972 Elevated prostate specific antigen [PSA]: Secondary | ICD-10-CM

## 2020-03-14 DIAGNOSIS — R7989 Other specified abnormal findings of blood chemistry: Secondary | ICD-10-CM

## 2020-03-14 NOTE — Telephone Encounter (Signed)
I spoke to Cresson and she is in agreement with connecting to CCM for medication management and he needs assistance affording medication.   1. He has a nurse visit arranged on 04/05/20 to check BP and if still not at goal- will need BP meds increased.  2. He cannot afford Vit D3 OTC until [facility] 03/16/20 and this is  $12.  3. He needs to start a statin for hx of CVA, HTN and ASCVD protection- no funds for extra medication.   Catie- any chance of you meeting him on 04/05/20 when he comes in for BP re check? His fiance- [name] is the main contact number- [phone]. Pt does not carry a cell phone. She can reschedule that nurse visit to meet your availability.

## 2020-03-14 NOTE — Telephone Encounter (Signed)
Yes, I can meet with them.   Amber, please schedule over my acute block that morning.   Catie

## 2020-03-15 ENCOUNTER — Telehealth: Payer: Self-pay

## 2020-03-15 NOTE — Chronic Care Management (AMB) (Signed)
  Care Management   Note  03/15/2020 Name: Todd Craig MRN: [id number] DOB: July 26, 1960  Todd Craig is a 59 y.o. year old male who is a primary care patient of Marval Regal, NP. I reached out to [name] by phone today in response to a referral sent by Todd Craig's PCP Denice Paradise, NP.    Mr. Decicco was given information about care management services today including:  1. Care management services include personalized support from designated clinical staff supervised by his physician, including individualized plan of care and coordination with other care providers 2. 24/7 contact phone numbers for assistance for urgent and routine care needs. 3. The patient may stop care management services at any time by phone call to the office staff.  Patient agreed to services and verbal consent obtained.   Follow up plan: Face to Face appointment with care management team member scheduled for: 04/05/2020  Noreene Larsson, [address], [place], [place] [address] Direct Dial: [phone] [email or web address]: [email or web address]

## 2020-03-15 NOTE — Telephone Encounter (Signed)
Pt has been scheduled.  °

## 2020-03-23 ENCOUNTER — Ambulatory Visit: Payer: Medicaid Other | Admitting: Urology

## 2020-03-23 ENCOUNTER — Telehealth: Payer: Self-pay | Admitting: Urology

## 2020-03-23 NOTE — Telephone Encounter (Signed)
This patient was a no-show today for further evaluation of elevated PSA.  We will send him a letter asking him to reschedule.  At minimum, would recommend trying to repeat the PSA with any other blood draw performed by PCP.  Will contact Denice Paradise (covering Dr. Derrel Nip).    Hollice Espy, MD

## 2020-03-23 NOTE — Progress Notes (Deleted)
03/23/2020 12:39 PM   Todd Craig 1961/02/11 [id number]  Referring provider: Marval Regal, NP No address on file  No chief complaint on file.   HPI: 59 year old male with multiple medical issues who presents today for further evaluation of elevated PSA.  He recently established with a new primary care provider.  His PSA was noted to be elevated to 6.84 with no previous values for comparison.  He denies any urinary symptoms.   PMH: Past Medical History:  Diagnosis Date  . GERD (gastroesophageal reflux disease)   . Hypertension   . Hypertensive crisis, unspecified 01/11/2020  . Stroke [facility])     Surgical History: Past Surgical History:  Procedure Laterality Date  . right hand repair  1985   trauma with saw laceration     Home Medications:  Allergies as of 03/23/2020      Reactions   Tramadol    Other reaction(s): Other      Medication List       Accurate as of March 23, 2020 12:39 PM. If you have any questions, ask your nurse or doctor.        amLODipine 10 MG tablet Commonly known as: NORVASC Take 1 tablet (10 mg total) by mouth daily.   losartan 50 MG tablet Commonly known as: COZAAR Take 1 tablet (50 mg total) by mouth daily.       Allergies:  Allergies  Allergen Reactions  . Tramadol     Other reaction(s): Other    Family History: No family history on file.  Social History:  reports that he has been smoking cigarettes. He has been smoking about 1.00 pack per day. He has never used smokeless tobacco. He reports current alcohol use of about 5.0 standard drinks of alcohol per week. He reports that he does not use drugs.   Physical Exam: There were no vitals taken for this visit.  Constitutional:  Alert and oriented, No acute distress. HEENT: [place] AT, moist mucus membranes.  Trachea midline, no masses. Cardiovascular: No clubbing, cyanosis, or edema. Respiratory: Normal respiratory effort, no increased work of breathing. GI: Abdomen is  soft, nontender, nondistended, no abdominal masses GU: No CVA tenderness Lymph: No cervical or inguinal lymphadenopathy. Skin: No rashes, bruises or suspicious lesions. Neurologic: Grossly intact, no focal deficits, moving all 4 extremities. Psychiatric: Normal mood and affect.  Laboratory Data: Lab Results  Component Value Date   WBC 9.2 02/16/2020   HGB 16.3 02/16/2020   HCT 48.1 02/16/2020   MCV 100.9 (H) 02/16/2020   PLT 197.0 02/16/2020    Lab Results  Component Value Date   CREATININE 1.41 02/25/2020    Lab Results  Component Value Date   PSA 6.84 (H) 02/16/2020    No results found for: TESTOSTERONE  Lab Results  Component Value Date   HGBA1C 4.9 02/16/2020    Urinalysis No results found for: COLORURINE, APPEARANCEUR, LABSPEC, PHURINE, GLUCOSEU, HGBUR, BILIRUBINUR, KETONESUR, PROTEINUR, UROBILINOGEN, NITRITE, LEUKOCYTESUR  No results found for: LABMICR, [place], RBCUA, LABEPIT, MUCUS, BACTERIA  Pertinent Imaging: *** No results found for this or any previous visit.  No results found for this or any previous visit.  No results found for this or any previous visit.  No results found for this or any previous visit.  No results found for this or any previous visit.  No results found for this or any previous visit.  No results found for this or any previous visit.  No results found for this or any previous visit.  Assessment & Plan:    There are no diagnoses linked to this encounter.  No follow-ups on file.  Hollice Espy, MD  [facility] [address] [place], [place] [address] [phone]

## 2020-03-24 ENCOUNTER — Encounter: Payer: Self-pay | Admitting: Urology

## 2020-03-24 NOTE — Telephone Encounter (Signed)
Please call pt or sent letter needs to f/u with [facility] urology elevated PSA this is impt

## 2020-03-29 NOTE — Telephone Encounter (Signed)
Patient is very hard to get a hold of; letter sent to patient to contact [place] urology to reschedule appt.

## 2020-04-05 ENCOUNTER — Ambulatory Visit: Payer: Medicaid Other

## 2020-05-24 DIAGNOSIS — H5213 Myopia, bilateral: Secondary | ICD-10-CM | POA: Diagnosis not present

## 2020-09-26 ENCOUNTER — Encounter: Payer: Self-pay | Admitting: Emergency Medicine

## 2020-09-26 ENCOUNTER — Other Ambulatory Visit: Payer: Self-pay

## 2020-09-26 ENCOUNTER — Emergency Department
Admission: EM | Admit: 2020-09-26 | Discharge: 2020-09-26 | Disposition: A | Discharge status: Home / Self Care | Payer: Medicaid Other | Attending: Emergency Medicine | Admitting: Emergency Medicine

## 2020-09-26 DIAGNOSIS — F17210 Nicotine dependence, cigarettes, uncomplicated: Secondary | ICD-10-CM | POA: Insufficient documentation

## 2020-09-26 DIAGNOSIS — I129 Hypertensive chronic kidney disease with stage 1 through stage 4 chronic kidney disease, or unspecified chronic kidney disease: Secondary | ICD-10-CM | POA: Insufficient documentation

## 2020-09-26 DIAGNOSIS — M10072 Idiopathic gout, left ankle and foot: Secondary | ICD-10-CM | POA: Insufficient documentation

## 2020-09-26 DIAGNOSIS — Z79899 Other long term (current) drug therapy: Secondary | ICD-10-CM | POA: Insufficient documentation

## 2020-09-26 DIAGNOSIS — N1831 Chronic kidney disease, stage 3a: Secondary | ICD-10-CM | POA: Diagnosis not present

## 2020-09-26 DIAGNOSIS — M79672 Pain in left foot: Secondary | ICD-10-CM | POA: Diagnosis present

## 2020-09-26 LAB — BASIC METABOLIC PANEL
Anion gap: 6 (ref 5–15)
BUN: 14 mg/dL (ref 6–20)
CO2: 24 mmol/L (ref 22–32)
Calcium: 10.3 mg/dL (ref 8.9–10.3)
Chloride: 108 mmol/L (ref 98–111)
Creatinine, Ser: 1.39 mg/dL — ABNORMAL HIGH (ref 0.61–1.24)
GFR, Estimated: 59 mL/min — ABNORMAL LOW (ref >60)
Glucose, Bld: 116 mg/dL — ABNORMAL HIGH (ref 70–99)
Potassium: 3.6 mmol/L (ref 3.5–5.1)
Sodium: 138 mmol/L (ref 135–145)

## 2020-09-26 LAB — CBC WITH DIFFERENTIAL/PLATELET
Abs Immature Granulocytes: 0.05 10*3/uL (ref 0.00–0.07)
Basophils Absolute: 0 10*3/uL (ref 0.0–0.1)
Basophils Relative: 0 %
Eosinophils Absolute: 0.1 10*3/uL (ref 0.0–0.5)
Eosinophils Relative: 1 %
HCT: 45.1 % (ref 39.0–52.0)
Hemoglobin: 15.5 g/dL (ref 13.0–17.0)
Immature Granulocytes: 1 %
Lymphocytes Relative: 14 %
Lymphs Abs: 1.3 10*3/uL (ref 0.7–4.0)
MCH: 34.1 pg — ABNORMAL HIGH (ref 26.0–34.0)
MCHC: 34.4 g/dL (ref 30.0–36.0)
MCV: 99.1 fL (ref 80.0–100.0)
Monocytes Absolute: 0.6 10*3/uL (ref 0.1–1.0)
Monocytes Relative: 6 %
Neutro Abs: 7.5 10*3/uL (ref 1.7–7.7)
Neutrophils Relative %: 78 %
Platelets: 187 10*3/uL (ref 150–400)
RBC: 4.55 MIL/uL (ref 4.22–5.81)
RDW: 12.5 % (ref 11.5–15.5)
WBC: 9.7 10*3/uL (ref 4.0–10.5)
nRBC: 0 % (ref 0.0–0.2)

## 2020-09-26 LAB — URIC ACID: Uric Acid, Serum: 8.7 mg/dL — ABNORMAL HIGH (ref 3.7–8.6)

## 2020-09-26 MED ORDER — AMLODIPINE BESYLATE 10 MG PO TABS: 10.0000 mg | ORAL_TABLET | Freq: Every day | ORAL | 1 refills | Status: DC

## 2020-09-26 MED ORDER — COLCHICINE 0.6 MG PO TABS: 0.6000 mg | ORAL_TABLET | Freq: Every day | ORAL | 0 refills | Status: DC

## 2020-09-26 MED ORDER — OXYCODONE-ACETAMINOPHEN 5-325 MG PO TABS
1.0000 | ORAL_TABLET | Freq: Once | ORAL | Status: AC
  Administered 2020-09-26: 1 via ORAL
  Filled 2020-09-26: qty 1

## 2020-09-26 MED ORDER — COLCHICINE 0.6 MG PO TABS
1.2000 mg | ORAL_TABLET | Freq: Once | ORAL | Status: AC
  Administered 2020-09-26: 1.2 mg via ORAL
  Filled 2020-09-26: qty 2

## 2020-09-26 MED ORDER — HYDROCODONE-ACETAMINOPHEN 5-325 MG PO TABS: 1.0000 | ORAL_TABLET | Freq: Three times a day (TID) | ORAL | 0 refills | Status: AC | PRN

## 2020-09-26 MED ORDER — LOSARTAN POTASSIUM 50 MG PO TABS: 50.0000 mg | ORAL_TABLET | Freq: Every day | ORAL | 1 refills | Status: DC

## 2020-09-26 NOTE — ED Triage Notes (Signed)
C/O left foot pain x 2 days.

## 2020-09-26 NOTE — Discharge Instructions (Addendum)
Take the prescription meds as directed. Restart your blood pressure medicine.

## 2020-09-26 NOTE — ED Provider Notes (Signed)
[facility] Emergency Department Provider Note ____________________________________________  Time seen: 5102  I have reviewed the triage vital signs and the nursing notes.  HISTORY  Chief Complaint  Foot Pain   HPI Todd Craig is a 60 y.o. male with below medical history, presents himself to the ED for evaluation of acute left foot pain.  Patient localizes exquisite tenderness and redness to the first MTP.  He denies any preceding injury or trauma.  He also denies any history of gout, although he does note that his father had gout.  Patient is using a single-point cane ambulate at this time.  He denies any other injury at this time.  Past Medical History:  Diagnosis Date   GERD (gastroesophageal reflux disease)    Hypertension    Hypertensive crisis, unspecified 01/11/2020   Stroke [facility])     Patient Active Problem List   Diagnosis Date Noted   Elevated PSA 03/10/2020   Low vitamin D level 03/10/2020   Hyperparathyroidism ([place]) 03/10/2020   Microalbuminuria 03/10/2020   Stage 3a chronic kidney disease ([place]) 03/10/2020   Pure hypercholesterolemia 03/10/2020   Hypercalcemia 02/17/2020   Obesity, Class I, BMI 30-34.9 02/16/2020   Elevated serum creatinine 02/16/2020   Serum calcium elevated 02/16/2020   Preventative health care 02/16/2020   Primary hypertension 01/19/2020   Nicotine dependence due to vaping tobacco product 01/19/2020   Overweight with body mass index (BMI) 25.0-29.9 01/19/2020    Past Surgical History:  Procedure Laterality Date   right hand repair  1985   trauma with saw laceration     Prior to Admission medications   Medication Sig Start Date End Date Taking? Authorizing Provider  amLODipine (NORVASC) 10 MG tablet Take 1 tablet (10 mg total) by mouth daily. 09/26/20 11/25/20 Yes Hailey Miles, Dannielle Karvonen, PA-C  colchicine 0.6 MG tablet Take 1 tablet (0.6 mg total) by mouth daily for 5 doses. 09/26/20 10/01/20 Yes Aleighna Wojtas, Dannielle Karvonen, PA-C  HYDROcodone-acetaminophen (NORCO) 5-325 MG tablet Take 1 tablet by mouth 3 (three) times daily as needed for up to 3 days. 09/26/20 09/29/20 Yes Joanann Mies, Dannielle Karvonen, PA-C  losartan (COZAAR) 50 MG tablet Take 1 tablet (50 mg total) by mouth daily. 09/26/20 11/25/20 Yes Tahlia Deamer, Dannielle Karvonen, PA-C  amLODipine (NORVASC) 10 MG tablet Take 1 tablet (10 mg total) by mouth daily. 02/15/20 04/15/20  Marval Regal, NP  losartan (COZAAR) 50 MG tablet Take 1 tablet (50 mg total) by mouth daily. 02/16/20   Marval Regal, NP    Allergies Tramadol  History reviewed. No pertinent family history.  Social History Social History   Tobacco Use   Smoking status: Every Day    Packs/day: 1.00    Pack years: 0.00    Types: Cigarettes   Smokeless tobacco: Never  Vaping Use   Vaping Use: Never used  Substance Use Topics   Alcohol use: Yes    Alcohol/week: 5.0 standard drinks    Types: 5 Cans of beer per week   Drug use: No    Review of Systems  Constitutional: Negative for fever. Eyes: Negative for visual changes. ENT: Negative for sore throat. Cardiovascular: Negative for chest pain. Respiratory: Negative for shortness of breath. Gastrointestinal: Negative for abdominal pain, vomiting and diarrhea. Genitourinary: Negative for dysuria. Musculoskeletal: Negative for back pain.   Acute left foot pain as above. Skin: Negative for rash. Neurological: Negative for headaches, focal weakness or numbness. ____________________________________________  PHYSICAL EXAM:  VITAL SIGNS: ED Triage  Vitals  Enc Vitals Group     BP 09/26/20 0953 (!) 188/125     Pulse Rate 09/26/20 0953 85     Resp 09/26/20 0953 18     Temp 09/26/20 0953 98.8 F (37.1 C)     Temp Source 09/26/20 0953 Oral     SpO2 09/26/20 0953 97 %     Weight 09/26/20 0947 238 lb 15.7 oz (108.4 kg)     Height 09/26/20 0947 6\' 2"  (1.88 m)     Head Circumference --      Peak Flow --      Pain Score 09/26/20 0947 7      Pain Loc --      Pain Edu? --      Excl. in [place]? --     Constitutional: Alert and oriented. Well appearing and in no distress. Head: Normocephalic and atraumatic. Eyes: Conjunctivae are normal. Normal extraocular movements Cardiovascular: Normal rate, regular rhythm. Normal distal pulses and cap refill. Respiratory: Normal respiratory effort. Musculoskeletal: Left foot without obvious deformity or dislocation.  Patient does have exquisite erythema and tenderness to the first MTP.  Nontender with normal range of motion in all extremities.  Neurologic: Antalgic gait without ataxia. Normal speech and language. No gross focal neurologic deficits are appreciated. Skin:  Skin is warm, dry and intact. No rash noted. Psychiatric: Mood and affect are normal. Patient exhibits appropriate insight and judgment. ____________________________________________   LABS (pertinent positives/negatives)  Labs Reviewed  CBC WITH DIFFERENTIAL/PLATELET - Abnormal; Notable for the following components:      Result Value   MCH 34.1 (*)    All other components within normal limits  BASIC METABOLIC PANEL - Abnormal; Notable for the following components:   Glucose, Bld 116 (*)    Creatinine, Ser 1.39 (*)    GFR, Estimated 59 (*)    All other components within normal limits  URIC ACID - Abnormal; Notable for the following components:   Uric Acid, Serum 8.7 (*)    All other components within normal limits  ____________________________________________  PROCEDURES  Colchicine 1.2 mg PO Percocet 5-325 mg PO  Procedures ____________________________________________   INITIAL IMPRESSION / ASSESSMENT AND PLAN / ED COURSE  As part of my medical decision making, I reviewed the following data within the [place] reviewed as above, Radiograph reviewed WNL, Notes from prior ED visits, and [place] Controlled Substance Database   DDX: gout, cellulitis, contusion  Patient with ED evaluation  of acute left first MTP pain for last several days.  Evaluated for his complaint is fine, and found to have elevated uric acid at 8.7.  Patient was also evaluated because he is out of his blood pressure medicine for the last several months.  No other abnormal lab findings are appreciated.  He is discharged after an oral dose of colchicine and Percocet, with a prescription for colchicine and hydrocodone.  He is advised to avoid any anti-inflammatories as his blood pressure is poorly controlled.  Prescriptions for his amlodipine and losartan are provided for his benefit.  He is strongly encouraged to follow-up with his primary provider for ongoing health maintenance.  Return precautions have been discussed.  Matteo Banke was evaluated in Emergency Department on 09/26/2020 for the symptoms described in the history of present illness. He was evaluated in the context of the global COVID-19 pandemic, which necessitated consideration that the patient might be at risk for infection with the SARS-CoV-2 virus that causes COVID-19. Institutional protocols and algorithms  that pertain to the evaluation of patients at risk for COVID-19 are in a state of rapid change based on information released by regulatory bodies including the CDC and federal and state organizations. These policies and algorithms were followed during the patient's care in the ED.  I reviewed the patient's prescription history over the last 12 months in the multi-state controlled substances database(s) that includes [place], [place], [place], [place], [place], [place], [place], [place], [place], [place], [place], [place], [place], and [place].  Results were notable for no RX noted.  ____________________________________________  FINAL CLINICAL IMPRESSION(S) / ED DIAGNOSES  Final diagnoses:  Acute idiopathic gout involving toe of left foot      Carmie End, Dannielle Karvonen, PA-C 09/26/20 1406    Duffy Bruce,  MD 09/26/20 [phone]

## 2020-09-27 ENCOUNTER — Telehealth: Payer: Self-pay

## 2020-09-27 NOTE — Telephone Encounter (Signed)
Transition Care Management Unsuccessful Follow-up Telephone Call  Date of discharge and from where:  09/26/2020 from [facility]  Attempts:  1st Attempt  Reason for unsuccessful TCM follow-up call:  Left voice message

## 2020-09-28 NOTE — Telephone Encounter (Signed)
Transition Care Management Unsuccessful Follow-up Telephone Call  Date of discharge and from where:  09/26/2020 from [facility]  Attempts:  2nd Attempt  Reason for unsuccessful TCM follow-up call:  Left voice message

## 2020-09-29 NOTE — Telephone Encounter (Signed)
Transition Care Management Unsuccessful Follow-up Telephone Call  Date of discharge and from where:  09/26/2020 from [facility]  Attempts:  3rd Attempt  Reason for unsuccessful TCM follow-up call:  Unable to reach patient

## 2020-12-01 ENCOUNTER — Encounter: Payer: Self-pay | Admitting: Adult Health

## 2021-01-04 ENCOUNTER — Ambulatory Visit: Payer: Medicaid Other | Admitting: Adult Health

## 2021-02-07 ENCOUNTER — Ambulatory Visit: Payer: Medicaid Other | Admitting: Adult Health

## 2021-02-18 ENCOUNTER — Encounter: Payer: Self-pay | Admitting: Nurse Practitioner

## 2021-02-18 DIAGNOSIS — Z8673 Personal history of transient ischemic attack (TIA), and cerebral infarction without residual deficits: Secondary | ICD-10-CM | POA: Insufficient documentation

## 2021-02-18 DIAGNOSIS — Z789 Other specified health status: Secondary | ICD-10-CM | POA: Insufficient documentation

## 2021-02-18 DIAGNOSIS — M109 Gout, unspecified: Secondary | ICD-10-CM | POA: Insufficient documentation

## 2021-02-18 DIAGNOSIS — K219 Gastro-esophageal reflux disease without esophagitis: Secondary | ICD-10-CM | POA: Insufficient documentation

## 2021-03-02 ENCOUNTER — Ambulatory Visit: Payer: Medicaid Other | Admitting: Nurse Practitioner

## 2021-03-02 ENCOUNTER — Encounter: Payer: Self-pay | Admitting: Nurse Practitioner

## 2021-03-02 ENCOUNTER — Other Ambulatory Visit: Payer: Self-pay

## 2021-03-02 VITALS — BP 148/98 | Temp 98.3°F | Wt 229.2 lb

## 2021-03-02 DIAGNOSIS — Z8673 Personal history of transient ischemic attack (TIA), and cerebral infarction without residual deficits: Secondary | ICD-10-CM

## 2021-03-02 DIAGNOSIS — N400 Enlarged prostate without lower urinary tract symptoms: Secondary | ICD-10-CM | POA: Diagnosis not present

## 2021-03-02 DIAGNOSIS — Z7689 Persons encountering health services in other specified circumstances: Secondary | ICD-10-CM

## 2021-03-02 DIAGNOSIS — H6123 Impacted cerumen, bilateral: Secondary | ICD-10-CM | POA: Diagnosis not present

## 2021-03-02 DIAGNOSIS — R972 Elevated prostate specific antigen [PSA]: Secondary | ICD-10-CM

## 2021-03-02 DIAGNOSIS — N1831 Chronic kidney disease, stage 3a: Secondary | ICD-10-CM

## 2021-03-02 DIAGNOSIS — E210 Primary hyperparathyroidism: Secondary | ICD-10-CM | POA: Diagnosis not present

## 2021-03-02 DIAGNOSIS — M1A3720 Chronic gout due to renal impairment, left ankle and foot, without tophus (tophi): Secondary | ICD-10-CM

## 2021-03-02 DIAGNOSIS — R809 Proteinuria, unspecified: Secondary | ICD-10-CM

## 2021-03-02 DIAGNOSIS — F17290 Nicotine dependence, other tobacco product, uncomplicated: Secondary | ICD-10-CM

## 2021-03-02 DIAGNOSIS — E7800 Pure hypercholesterolemia, unspecified: Secondary | ICD-10-CM

## 2021-03-02 DIAGNOSIS — M7501 Adhesive capsulitis of right shoulder: Secondary | ICD-10-CM | POA: Diagnosis not present

## 2021-03-02 DIAGNOSIS — I10 Essential (primary) hypertension: Secondary | ICD-10-CM

## 2021-03-02 DIAGNOSIS — E663 Overweight: Secondary | ICD-10-CM

## 2021-03-02 DIAGNOSIS — Z789 Other specified health status: Secondary | ICD-10-CM

## 2021-03-02 LAB — MICROALBUMIN, URINE WAIVED
Creatinine, Urine Waived: 300 mg/dL (ref 10–300)
Microalb, Ur Waived: 150 mg/L — ABNORMAL HIGH (ref 0–19)

## 2021-03-02 LAB — BAYER DCA HB A1C WAIVED: HB A1C (BAYER DCA - WAIVED): 4.3 % — ABNORMAL LOW (ref 4.8–5.6)

## 2021-03-02 MED ORDER — AMLODIPINE BESYLATE 10 MG PO TABS: 10.0000 mg | ORAL_TABLET | Freq: Every day | ORAL | 4 refills | Status: DC

## 2021-03-02 MED ORDER — LOSARTAN POTASSIUM 50 MG PO TABS: 50.0000 mg | ORAL_TABLET | Freq: Every day | ORAL | 4 refills | Status: DC

## 2021-03-02 NOTE — Assessment & Plan Note (Addendum)
CVA in 2012, discussed at length importance of smoking cessation + adequate BP control and cholesterol control.  He is agreeable to starting statin if cholesterol levels are elevated, check lipid panel and CMP today. Goal BP <130/90, LDL <70, A1c <6.5%.  A1c today 4.3%

## 2021-03-02 NOTE — Assessment & Plan Note (Signed)
Last flare in June 2022.  Continue Colchicine as needed, although may need to adjust dependent on kidney function -- may need renal doses Allopurinol.  Check uric acid and CMP today.

## 2021-03-02 NOTE — Assessment & Plan Note (Signed)
Significant frozen shoulder present, referral to ortho placed for further evaluation and treatment.

## 2021-03-02 NOTE — Assessment & Plan Note (Signed)
BMI 29.43. Recommended eating smaller high protein, low fat meals more frequently and exercising 30 mins a day 5 times a week with a goal of 10-15lb weight loss in the next 3 months. Patient voiced their understanding and motivation to adhere to these recommendations.

## 2021-03-02 NOTE — Assessment & Plan Note (Signed)
Ongoing issue, reports reduction in alcohol use.  Recommend continued cut back and discussed at length the effect of alcohol on blood pressure and liver health.

## 2021-03-02 NOTE — Assessment & Plan Note (Signed)
I have recommended complete cessation of tobacco use. I have discussed various options available for assistance with tobacco cessation including over the counter methods (Nicotine gum, patch and lozenges). We also discussed prescription options (Chantix, Nicotine Inhaler / Nasal Spray). The patient is not interested in pursuing any prescription tobacco cessation options at this time.  

## 2021-03-02 NOTE — Assessment & Plan Note (Signed)
Noted on past labs, will recheck today.  Does have underlying CKD and HTN.  Urine ALB 150 today, discussed with patient that if ongoing elevations would refer to nephrology as has family history of CKD with a brother who needed dialysis.

## 2021-03-02 NOTE — Assessment & Plan Note (Signed)
History of elevations, recheck today.  If ongoing elevations consider referral to urology.

## 2021-03-02 NOTE — Patient Instructions (Signed)

## 2021-03-02 NOTE — Progress Notes (Signed)
New Patient Office Visit  Subjective:  Patient ID: Todd Craig, male    DOB: 01/27/1961  Age: 60 y.o. MRN: [id number]  CC:  Chief Complaint  Patient presents with   Establish Care    Patient significant other states patient is here to establish care and states patient was seeing another provider and states the provider left the office. Patient has been without medications for his Hypertension for a few months now. Patient denies keeping blood pressure at home and states it may be elevated as he has not had his medication in months. Patient significant other would also like patient to be tested for Diabetes as she states he eats a lot of different foods and then patient will just go    Hearing Loss    Patient significant states patient is also having issues with his hearing and states it is getting worse and it is in bilateral ears. Patient significant it is effecting his every day life.    HPI Todd Craig presents for new patient visit to establish care.  Introduced to Designer, jewellery role and practice setting.  All questions answered.  Discussed provider/patient relationship and expectations.  Presents with fiance, [name], to assist.  He was being followed elsewhere, but they left practice.    HEARING LOSS Trouble hearing out of both ears, one not worse then other. Upper respiratory infection symptoms: no Pruritus: no Hearing loss: no Water immersion no Using Q-tips: no Recurrent otitis media: no Status: stable Treatments attempted: none   HYPERTENSION Has been out of medication for months, has history of stroke in 2012 with some persistent memory loss.  Was previously taking Losartan and Amlodipine.  Has never taken statin.  He is a current smoker -- started in his 17's, smokes about 1/2 PPD.  He is interested in quitting, but does not want medications, wished to try to cut back.  Previously drank 1 pint a week of liquor and beer, currently has cut back per his report.  His  fiance states he lives near the store to get beer.  No family history of alcohol abuse. Hypertension status: uncontrolled  Satisfied with current treatment? yes Duration of hypertension: chronic BP monitoring frequency:  not checking BP range:  BP medication side effects:  no Medication compliance: poor compliance Previous BP meds: none Aspirin: no Recurrent headaches: no Visual changes: no Palpitations: no Dyspnea: no Chest pain: no Lower extremity edema: no Dizzy/lightheaded: no   CHRONIC KIDNEY DISEASE/HYPERPARATHYROID Labs in 2021 = PTH 121, in June 2022 noted CRT 1.39 and eGFR 59.  Has never seen nephrology.  Does have chronic gout, takes Colchicine as needed for flares.  Last flare in June 2022. CKD status: stable Medications renally dose: yes Previous renal evaluation: no Pneumovax:  Not up to Date Influenza Vaccine:  Not up to Date   SHOULDER PAIN (RIGHT) For months has been unable to reach up with right arm -- painful. Recently was working and helping someone load wood, the right arm popped when he lifted up. Duration: months Involved shoulder: right Mechanism of injury: unknown Location: anterior Onset:sudden Severity: 10/10  Quality:  dull and aching Frequency: constant Radiation: no Aggravating factors: lifting, movement, and throwing  Alleviating factors: nothing  Status: fluctuating Treatments attempted: none  Relief with NSAIDs?:  No NSAIDs Taken Weakness: yes Numbness: no Decreased grip strength: yes Redness: no Swelling: no Bruising: no Fevers: no   GERD Has occasional heart burn and takes TUMS. GERD control status: stable Satisfied with current treatment?  yes Heartburn frequency: not often Medication side effects: no  Medication compliance: stable Previous GERD medications: TUMS Antacid use frequency:  occasional Dysphagia: no Odynophagia:  no Hematemesis: no Blood in stool: no EGD: no   Depression screen [facility] 2/9 03/02/2021 02/16/2020  01/11/2020  Decreased Interest 1 0 2  Down, Depressed, Hopeless 0 0 0  PHQ - 2 Score 1 0 2  Altered sleeping 2 0 3  Tired, decreased energy 2 0 3  Change in appetite 0 0 0  Feeling bad or failure about yourself  0 0 1  Trouble concentrating 1 0 3  Moving slowly or fidgety/restless 0 0 2  Suicidal thoughts 0 0 0  PHQ-9 Score 6 0 14  Difficult doing work/chores Somewhat difficult Not difficult at all Not difficult at all    Past Medical History:  Diagnosis Date   GERD (gastroesophageal reflux disease)    Hypertension    Hypertensive crisis, unspecified 01/11/2020   Stroke [facility])     Past Surgical History:  Procedure Laterality Date   right hand repair  1985   trauma with saw laceration    History reviewed. No pertinent family history.  Social History   Socioeconomic History   Marital status: Single    Spouse name: Not on file   Number of children: Not on file   Years of education: Not on file   Highest education level: Not on file  Occupational History   Not on file  Tobacco Use   Smoking status: Every Day    Packs/day: 1.00    Types: Cigarettes   Smokeless tobacco: Never  Vaping Use   Vaping Use: Never used  Substance and Sexual Activity   Alcohol use: Yes    Alcohol/week: 5.0 standard drinks    Types: 5 Cans of beer per week   Drug use: No   Sexual activity: Yes  Other Topics Concern   Not on file  Social History Narrative   Not on file   Social Determinants of Health   Financial Resource Strain: Low Risk    Difficulty of Paying Living Expenses: Not very hard  Food Insecurity: No Food Insecurity   Worried About Running Out of Food in the Last Year: Never true   Ran Out of Food in the Last Year: Never true  Transportation Needs: No Transportation Needs   Lack of Transportation (Medical): No   Lack of Transportation (Non-Medical): No  Physical Activity: Sufficiently Active   Days of Exercise per Week: 4 days   Minutes of Exercise per Session: 40 min   Stress: No Stress Concern Present   Feeling of Stress : Only a little  Social Connections: Moderately Integrated   Frequency of Communication with Friends and Family: More than three times a week   Frequency of Social Gatherings with Friends and Family: More than three times a week   Attends Religious Services: More than 4 times per year   Active Member of [employer] or Organizations: No   Attends Archivist Meetings: Never   Marital Status: Living with partner  Intimate Partner Violence: Not At Risk   Fear of Current or Ex-Partner: No   Emotionally Abused: No   Physically Abused: No   Sexually Abused: No    ROS Review of Systems  Constitutional:  Positive for fatigue. Negative for activity change, appetite change and fever.  HENT:  Positive for hearing loss. Negative for ear discharge and ear pain.   Respiratory:  Negative for cough, chest tightness,  shortness of breath and wheezing.   Cardiovascular: Negative.   Gastrointestinal: Negative.   Musculoskeletal:  Positive for arthralgias.  Neurological: Negative.   Psychiatric/Behavioral: Negative.     Objective:   Today's Vitals: BP (!) 148/98 (BP Location: Left Arm, Patient Position: Sitting)   Temp 98.3 F (36.8 C) (Oral)   Wt 229 lb 3.2 oz (104 kg)   SpO2 98%   BMI 29.43 kg/m   Physical Exam Vitals and nursing note reviewed.  Constitutional:      General: He is awake. He is not in acute distress.    Appearance: He is well-developed and well-groomed. He is not ill-appearing or toxic-appearing.  HENT:     Head: Normocephalic and atraumatic.     Right Ear: Hearing, ear canal and external ear normal. No drainage. There is impacted cerumen.     Left Ear: Hearing, ear canal and external ear normal. No drainage. There is impacted cerumen.     Ears:     Comments: Cerumen impacted bilaterally, ears irrigated by CMA with successful removal moderate cerumen bilaterally. Tolerated well with clearance noted on exam and able  to view TM bilaterally, patient reported improved hearing.    Nose: Nose normal.     Mouth/Throat:     Pharynx: Uvula midline.  Eyes:     General: Lids are normal.        Right eye: No discharge.        Left eye: No discharge.     Extraocular Movements: Extraocular movements intact.     Conjunctiva/sclera: Conjunctivae normal.     Pupils: Pupils are equal, round, and reactive to light.     Visual Fields: Right eye visual fields normal and left eye visual fields normal.  Neck:     Thyroid: No thyromegaly.     Vascular: No carotid bruit.  Cardiovascular:     Rate and Rhythm: Normal rate and regular rhythm.     Heart sounds: Normal heart sounds, S1 normal and S2 normal. No murmur heard.   No gallop.  Pulmonary:     Effort: Pulmonary effort is normal. No accessory muscle usage or respiratory distress.     Breath sounds: Normal breath sounds.  Abdominal:     General: Bowel sounds are normal.     Palpations: Abdomen is soft. There is no hepatomegaly or splenomegaly.     Tenderness: There is no abdominal tenderness.  Musculoskeletal:     Right shoulder: Tenderness (anterior aspect) present. No swelling or bony tenderness. Decreased range of motion (significant, only able to raised arm 50 degrees). Decreased strength. Normal pulse.     Left shoulder: Normal.     Cervical back: Normal range of motion and neck supple.     Right lower leg: No edema.     Left lower leg: No edema.  Lymphadenopathy:     Head:     Right side of head: No submental, submandibular, tonsillar, preauricular or posterior auricular adenopathy.     Left side of head: No submental, submandibular, tonsillar, preauricular or posterior auricular adenopathy.     Cervical: No cervical adenopathy.  Skin:    General: Skin is warm and dry.     Capillary Refill: Capillary refill takes less than 2 seconds.     Findings: No rash.  Neurological:     Mental Status: He is alert and oriented to person, place, and time.     Gait:  Gait is intact.     Deep Tendon Reflexes: Reflexes are normal  and symmetric.     Reflex Scores:      Brachioradialis reflexes are 2+ on the right side and 2+ on the left side.      Patellar reflexes are 2+ on the right side and 2+ on the left side.    Comments: Able to state day, month, year, location.  Psychiatric:        Attention and Perception: Attention normal.        Mood and Affect: Mood normal.        Speech: Speech normal.        Behavior: Behavior normal. Behavior is cooperative.        Thought Content: Thought content normal.    Assessment & Plan:   Problem List Items Addressed This Visit       Cardiovascular and Mediastinum   Primary hypertension    Chronic, ongoing with elevation today on exam.  Has been out of medication for several months.  Will restart Amlodipine and Losartan at previous doses and adjust as needed.  Recommend he monitor BP at least a few mornings a week at home and document.  DASH diet at home.  Continue current medication regimen and adjust as needed.  Labs today: CBC, CMP, TSH, PTH, urine ALB.  Return in 4 weeks.       Relevant Medications   losartan (COZAAR) 50 MG tablet   amLODipine (NORVASC) 10 MG tablet   Other Relevant Orders   CBC with Differential/Platelet   Comprehensive metabolic panel   TSH     Endocrine   Primary hyperparathyroidism ([place])    Noted on past labs, will recheck today.  Does have underlying CKD and HTN.  Urine ALB 150 today, discussed with patient that if ongoing elevations would refer to nephrology as has family history of CKD with a brother who needed dialysis.          Musculoskeletal and Integument   Adhesive capsulitis of right shoulder    Significant frozen shoulder present, referral to ortho placed for further evaluation and treatment.      Relevant Orders   Ambulatory referral to Orthopedics     Genitourinary   Stage 3a chronic kidney disease ([place]) - Primary    Noted on past labs, will recheck today.   Does have underlying hyperparathyroid noted past labs and HTN.  Urine ALB 150 today, discussed with patient that if ongoing elevations would refer to nephrology as has family history of CKD with a brother who needed dialysis.  Restart Losartan.        Other   Alcohol use    Ongoing issue, reports reduction in alcohol use.  Recommend continued cut back and discussed at length the effect of alcohol on blood pressure and liver health.      Elevated PSA    History of elevations, recheck today.  If ongoing elevations consider referral to urology.      Relevant Orders   PSA   Gout    Last flare in June 2022.  Continue Colchicine as needed, although may need to adjust dependent on kidney function -- may need renal doses Allopurinol.  Check uric acid and CMP today.      Relevant Orders   Uric acid   H/O: stroke    CVA in 2012, discussed at length importance of smoking cessation + adequate BP control and cholesterol control.  He is agreeable to starting statin if cholesterol levels are elevated, check lipid panel and CMP today. Goal BP <130/90, LDL <  70, A1c <6.5%.  A1c today 4.3%      Relevant Orders   Bayer DCA Hb A1c Waived (Completed)   Lipid Panel w/o Chol/HDL Ratio   Hypercalcemia    With hyperparathyroid and CKD, recheck PTH and Ca+ today -- consider nephrology referral.      Relevant Orders   PTH, intact and calcium   Comprehensive metabolic panel   Microalbuminuria    Ongoing issue with urine ALB 150 on check today.  Restart Losartan and check CMP today -- consider referral to nephrology.      Relevant Orders   Microalbumin, Urine Waived (Completed)   Nicotine dependence due to vaping tobacco product    I have recommended complete cessation of tobacco use. I have discussed various options available for assistance with tobacco cessation including over the counter methods (Nicotine gum, patch and lozenges). We also discussed prescription options (Chantix, Nicotine Inhaler /  Nasal Spray). The patient is not interested in pursuing any prescription tobacco cessation options at this time.       Overweight with body mass index (BMI) 25.0-29.9    BMI 29.43. Recommended eating smaller high protein, low fat meals more frequently and exercising 30 mins a day 5 times a week with a goal of 10-15lb weight loss in the next 3 months. Patient voiced their understanding and motivation to adhere to these recommendations.       Pure hypercholesterolemia    Ongoing, recheck lipid panel today.  Discussed with patient and fiance recommendations to start a low dose Rosuvastatin and educated on side effects.  Will check lipid panel and if elevations they are agreeable to starting low dose and increasing as needed for stroke prevention.  Goal LDL <70.        Relevant Medications   losartan (COZAAR) 50 MG tablet   amLODipine (NORVASC) 10 MG tablet   Other Visit Diagnoses     Bilateral impacted cerumen       Bilateral ears irrigated by CMA with clearance and improved hearing post procedure, tolerated well.   Encounter to establish care       New patient to practice.       Outpatient Encounter Medications as of 03/02/2021  Medication Sig   amLODipine (NORVASC) 10 MG tablet Take 1 tablet (10 mg total) by mouth daily.   colchicine 0.6 MG tablet Take 1 tablet (0.6 mg total) by mouth daily for 5 doses.   losartan (COZAAR) 50 MG tablet Take 1 tablet (50 mg total) by mouth daily.   [DISCONTINUED] amLODipine (NORVASC) 10 MG tablet Take 1 tablet (10 mg total) by mouth daily.   [DISCONTINUED] amLODipine (NORVASC) 10 MG tablet Take 1 tablet (10 mg total) by mouth daily.   [DISCONTINUED] losartan (COZAAR) 50 MG tablet Take 1 tablet (50 mg total) by mouth daily. (Patient not taking: Reported on 03/02/2021)   [DISCONTINUED] losartan (COZAAR) 50 MG tablet Take 1 tablet (50 mg total) by mouth daily.   No facility-administered encounter medications on file as of 03/02/2021.    Follow-up:  Return in about 4 weeks (around 03/30/2021) for HTN and SHOULDER CHECK.   Venita Lick, NP

## 2021-03-02 NOTE — Assessment & Plan Note (Signed)
Noted on past labs, will recheck today.  Does have underlying hyperparathyroid noted past labs and HTN.  Urine ALB 150 today, discussed with patient that if ongoing elevations would refer to nephrology as has family history of CKD with a brother who needed dialysis.  Restart Losartan.

## 2021-03-02 NOTE — Assessment & Plan Note (Signed)
Chronic, ongoing with elevation today on exam.  Has been out of medication for several months.  Will restart Amlodipine and Losartan at previous doses and adjust as needed.  Recommend he monitor BP at least a few mornings a week at home and document.  DASH diet at home.  Continue current medication regimen and adjust as needed.  Labs today: CBC, CMP, TSH, PTH, urine ALB.  Return in 4 weeks.

## 2021-03-02 NOTE — Assessment & Plan Note (Signed)
Ongoing issue with urine ALB 150 on check today.  Restart Losartan and check CMP today -- consider referral to nephrology.

## 2021-03-02 NOTE — Assessment & Plan Note (Signed)
With hyperparathyroid and CKD, recheck PTH and Ca+ today -- consider nephrology referral.

## 2021-03-02 NOTE — Assessment & Plan Note (Signed)
Ongoing, recheck lipid panel today.  Discussed with patient and fiance recommendations to start a low dose Rosuvastatin and educated on side effects.  Will check lipid panel and if elevations they are agreeable to starting low dose and increasing as needed for stroke prevention.  Goal LDL <70.

## 2021-03-04 LAB — COMPREHENSIVE METABOLIC PANEL
ALT: 13 IU/L (ref 0–44)
AST: 15 IU/L (ref 0–40)
Albumin/Globulin Ratio: 1.7 (ref 1.2–2.2)
Albumin: 4.5 g/dL (ref 3.8–4.9)
Alkaline Phosphatase: 84 IU/L (ref 44–121)
BUN/Creatinine Ratio: 10 (ref 9–20)
BUN: 13 mg/dL (ref 6–24)
Bilirubin Total: 0.9 mg/dL (ref 0.0–1.2)
CO2: 23 mmol/L (ref 20–29)
Calcium: 10.8 mg/dL — ABNORMAL HIGH (ref 8.7–10.2)
Chloride: 104 mmol/L (ref 96–106)
Creatinine, Ser: 1.29 mg/dL — ABNORMAL HIGH (ref 0.76–1.27)
Globulin, Total: 2.6 g/dL (ref 1.5–4.5)
Glucose: 92 mg/dL (ref 70–99)
Potassium: 4.3 mmol/L (ref 3.5–5.2)
Sodium: 141 mmol/L (ref 134–144)
Total Protein: 7.1 g/dL (ref 6.0–8.5)
eGFR: 64 mL/min/{1.73_m2} (ref >59)

## 2021-03-04 LAB — CBC WITH DIFFERENTIAL/PLATELET
Basophils Absolute: 0.1 10*3/uL (ref 0.0–0.2)
Basos: 1 %
EOS (ABSOLUTE): 0.3 10*3/uL (ref 0.0–0.4)
Eos: 3 %
Hematocrit: 49.8 % (ref 37.5–51.0)
Hemoglobin: 16.6 g/dL (ref 13.0–17.7)
Immature Grans (Abs): 0 10*3/uL (ref 0.0–0.1)
Immature Granulocytes: 0 %
Lymphocytes Absolute: 1.7 10*3/uL (ref 0.7–3.1)
Lymphs: 20 %
MCH: 33.3 pg — ABNORMAL HIGH (ref 26.6–33.0)
MCHC: 33.3 g/dL (ref 31.5–35.7)
MCV: 100 fL — ABNORMAL HIGH (ref 79–97)
Monocytes Absolute: 0.4 10*3/uL (ref 0.1–0.9)
Monocytes: 5 %
Neutrophils Absolute: 6.3 10*3/uL (ref 1.4–7.0)
Neutrophils: 71 %
Platelets: 214 10*3/uL (ref 150–450)
RBC: 4.99 x10E6/uL (ref 4.14–5.80)
RDW: 12.1 % (ref 11.6–15.4)
WBC: 8.9 10*3/uL (ref 3.4–10.8)

## 2021-03-04 LAB — LIPID PANEL W/O CHOL/HDL RATIO
Cholesterol, Total: 199 mg/dL (ref 100–199)
HDL: 43 mg/dL (ref >39)
LDL Chol Calc (NIH): 126 mg/dL — ABNORMAL HIGH (ref 0–99)
Triglycerides: 170 mg/dL — ABNORMAL HIGH (ref 0–149)
VLDL Cholesterol Cal: 30 mg/dL (ref 5–40)

## 2021-03-04 LAB — PSA: Prostate Specific Ag, Serum: 9.1 ng/mL — ABNORMAL HIGH (ref 0.0–4.0)

## 2021-03-04 LAB — PTH, INTACT AND CALCIUM: PTH: 95 pg/mL — ABNORMAL HIGH (ref 15–65)

## 2021-03-04 LAB — TSH: TSH: 0.911 u[IU]/mL (ref 0.450–4.500)

## 2021-03-04 LAB — URIC ACID: Uric Acid: 8.3 mg/dL (ref 3.8–8.4)

## 2021-03-05 ENCOUNTER — Other Ambulatory Visit: Payer: Self-pay | Admitting: Nurse Practitioner

## 2021-03-05 DIAGNOSIS — R809 Proteinuria, unspecified: Secondary | ICD-10-CM

## 2021-03-05 DIAGNOSIS — N1831 Chronic kidney disease, stage 3a: Secondary | ICD-10-CM

## 2021-03-05 DIAGNOSIS — R972 Elevated prostate specific antigen [PSA]: Secondary | ICD-10-CM

## 2021-03-05 DIAGNOSIS — I10 Essential (primary) hypertension: Secondary | ICD-10-CM

## 2021-03-05 DIAGNOSIS — E210 Primary hyperparathyroidism: Secondary | ICD-10-CM

## 2021-03-05 NOTE — Progress Notes (Signed)
Good morning crew, please reach out to patient and his DPR [name] [phone]) to alert them labs have returned: - A1c shows no prediabetes or diabetes present. - Parathyroid lab is elevated still with elevation in calcium too, but kidney function stable this check.  I am going to place a referral to kidney provider for further assessment of this to see if kidney related due to past labs and high protein levels in your urine.  They will call to schedule with you and if you do not hear from them over next week, let me know. - CBC shows no anemia. - Thyroid lab is normal. - Cholesterol labs are elevated -- I am sending in Rosuvastatin, a cholesterol lowering medication for your to start taking daily.  If any major leg cramps present with this let me know ASAP And stop taking.  At this time your levels are high enough to put you at higher risk for stroke or heart attack. - Prostate labs remains elevated, was elevated on check last year too and is trending up.  This does not necessarily mean presence of prostate cancer, can mean enlargement of prostate with aging too.  I am placing urology referral to further assess this since it is trending up.  Any questions? Keep being amazing!!  Thank you for allowing me to participate in your care.  I appreciate you. Kindest regards, Veta Dambrosia

## 2021-03-19 NOTE — Progress Notes (Incomplete)
   03/19/21 9:00 PM   [name] [birth year] [id number]  Referring provider:  Venita Lick, NP [address] [place],  [place] [address] No chief complaint on file.    HPI: Todd Craig is a 60 y.o.male who presents today for further evaluation of elevated PSA.   He has a personal history of CKD stage IIIa.   His most recent PSA on 03/02/2021 was 9.1. Previous PSA on 02/16/2020 was 6.84.       PMH: Past Medical History:  Diagnosis Date   GERD (gastroesophageal reflux disease)    Hypertension    Hypertensive crisis, unspecified 01/11/2020   Stroke [facility])     Surgical History: Past Surgical History:  Procedure Laterality Date   right hand repair  1985   trauma with saw laceration     Home Medications:  Allergies as of 03/21/2021       Reactions   Tramadol    Other reaction(s): Other        Medication List        Accurate as of March 19, 2021  9:00 PM. If you have any questions, ask your nurse or doctor.          amLODipine 10 MG tablet Commonly known as: NORVASC Take 1 tablet (10 mg total) by mouth daily.   colchicine 0.6 MG tablet Take 1 tablet (0.6 mg total) by mouth daily for 5 doses.   losartan 50 MG tablet Commonly known as: Cozaar Take 1 tablet (50 mg total) by mouth daily.        Allergies:  Allergies  Allergen Reactions   Tramadol     Other reaction(s): Other    Family History: No family history on file.  Social History:  reports that he has been smoking cigarettes. He has been smoking an average of 1 pack per day. He has never used smokeless tobacco. He reports current alcohol use of about 5.0 standard drinks per week. He reports that he does not use drugs.   Physical Exam: There were no vitals taken for this visit.  Constitutional:  Alert and oriented, No acute distress. HEENT: [place] AT, moist mucus membranes.  Trachea midline, no masses. Cardiovascular: No clubbing, cyanosis, or edema. Respiratory: Normal respiratory  effort, no increased work of breathing. Rectal: Normal sphincter tone,  ***  CC prostate, smooth no nodules Skin: No rashes, bruises or suspicious lesions. Neurologic: Grossly intact, no focal deficits, moving all 4 extremities. Psychiatric: Normal mood and affect.  Laboratory Data:  Lab Results  Component Value Date   CREATININE 1.29 (H) 03/02/2021    Lab Results  Component Value Date   PSA 6.84 (H) 02/16/2020    Lab Results  Component Value Date   HGBA1C 4.3 (L) 03/02/2021    Urinalysis   Pertinent Imaging:    Assessment & Plan:   Elevated PSA   No follow-ups on file.  [place] [address] [place], [place] [address] [phone]

## 2021-03-21 ENCOUNTER — Ambulatory Visit: Payer: Medicaid Other | Admitting: Urology

## 2021-04-04 ENCOUNTER — Other Ambulatory Visit: Payer: Self-pay | Admitting: Internal Medicine

## 2021-04-05 ENCOUNTER — Ambulatory Visit: Payer: Medicaid Other | Admitting: Nurse Practitioner

## 2021-05-08 DIAGNOSIS — N2581 Secondary hyperparathyroidism of renal origin: Secondary | ICD-10-CM | POA: Insufficient documentation

## 2021-05-08 DIAGNOSIS — E212 Other hyperparathyroidism: Secondary | ICD-10-CM | POA: Insufficient documentation

## 2021-05-08 DIAGNOSIS — I10 Essential (primary) hypertension: Secondary | ICD-10-CM | POA: Diagnosis not present

## 2021-05-08 DIAGNOSIS — R82998 Other abnormal findings in urine: Secondary | ICD-10-CM | POA: Diagnosis not present

## 2021-05-08 DIAGNOSIS — R801 Persistent proteinuria, unspecified: Secondary | ICD-10-CM | POA: Diagnosis not present

## 2021-05-08 DIAGNOSIS — N1831 Chronic kidney disease, stage 3a: Secondary | ICD-10-CM | POA: Diagnosis not present

## 2021-05-09 ENCOUNTER — Other Ambulatory Visit: Payer: Self-pay | Admitting: Nephrology

## 2021-05-09 DIAGNOSIS — R8290 Unspecified abnormal findings in urine: Secondary | ICD-10-CM

## 2021-05-09 DIAGNOSIS — N1831 Chronic kidney disease, stage 3a: Secondary | ICD-10-CM

## 2021-05-09 DIAGNOSIS — I10 Essential (primary) hypertension: Secondary | ICD-10-CM

## 2021-05-09 DIAGNOSIS — N2581 Secondary hyperparathyroidism of renal origin: Secondary | ICD-10-CM

## 2021-05-09 DIAGNOSIS — R801 Persistent proteinuria, unspecified: Secondary | ICD-10-CM

## 2021-05-10 ENCOUNTER — Ambulatory Visit: Payer: Medicaid Other | Admitting: Nurse Practitioner

## 2021-05-11 ENCOUNTER — Other Ambulatory Visit: Payer: Self-pay | Admitting: Nephrology

## 2021-05-11 DIAGNOSIS — N2581 Secondary hyperparathyroidism of renal origin: Secondary | ICD-10-CM

## 2021-05-17 ENCOUNTER — Ambulatory Visit: Payer: Medicaid Other

## 2021-05-17 ENCOUNTER — Encounter: Payer: Self-pay | Admitting: Emergency Medicine

## 2021-05-17 ENCOUNTER — Emergency Department: Payer: Medicaid Other

## 2021-05-17 ENCOUNTER — Other Ambulatory Visit: Payer: Self-pay

## 2021-05-17 ENCOUNTER — Emergency Department
Admission: EM | Admit: 2021-05-17 | Discharge: 2021-05-17 | Disposition: A | Discharge status: Home / Self Care | Payer: Medicaid Other | Attending: Emergency Medicine | Admitting: Emergency Medicine

## 2021-05-17 DIAGNOSIS — R079 Chest pain, unspecified: Secondary | ICD-10-CM | POA: Diagnosis not present

## 2021-05-17 DIAGNOSIS — Z20822 Contact with and (suspected) exposure to covid-19: Secondary | ICD-10-CM | POA: Diagnosis not present

## 2021-05-17 DIAGNOSIS — R61 Generalized hyperhidrosis: Secondary | ICD-10-CM | POA: Diagnosis not present

## 2021-05-17 DIAGNOSIS — R55 Syncope and collapse: Secondary | ICD-10-CM

## 2021-05-17 DIAGNOSIS — I10 Essential (primary) hypertension: Secondary | ICD-10-CM | POA: Diagnosis not present

## 2021-05-17 DIAGNOSIS — W19XXXA Unspecified fall, initial encounter: Secondary | ICD-10-CM | POA: Diagnosis not present

## 2021-05-17 DIAGNOSIS — R9431 Abnormal electrocardiogram [ECG] [EKG]: Secondary | ICD-10-CM | POA: Diagnosis not present

## 2021-05-17 LAB — COMPREHENSIVE METABOLIC PANEL
ALT: 18 U/L (ref 0–44)
AST: 15 U/L (ref 15–41)
Albumin: 4.1 g/dL (ref 3.5–5.0)
Alkaline Phosphatase: 74 U/L (ref 38–126)
Anion gap: 4 — ABNORMAL LOW (ref 5–15)
BUN: 14 mg/dL (ref 6–20)
CO2: 27 mmol/L (ref 22–32)
Calcium: 11 mg/dL — ABNORMAL HIGH (ref 8.9–10.3)
Chloride: 104 mmol/L (ref 98–111)
Creatinine, Ser: 1.53 mg/dL — ABNORMAL HIGH (ref 0.61–1.24)
GFR, Estimated: 52 mL/min — ABNORMAL LOW (ref >60)
Glucose, Bld: 120 mg/dL — ABNORMAL HIGH (ref 70–99)
Potassium: 3.9 mmol/L (ref 3.5–5.1)
Sodium: 135 mmol/L (ref 135–145)
Total Bilirubin: 0.9 mg/dL (ref 0.3–1.2)
Total Protein: 7.3 g/dL (ref 6.5–8.1)

## 2021-05-17 LAB — CBC
HCT: 47.8 % (ref 39.0–52.0)
Hemoglobin: 15.6 g/dL (ref 13.0–17.0)
MCH: 33.7 pg (ref 26.0–34.0)
MCHC: 32.6 g/dL (ref 30.0–36.0)
MCV: 103.2 fL — ABNORMAL HIGH (ref 80.0–100.0)
Platelets: 205 10*3/uL (ref 150–400)
RBC: 4.63 MIL/uL (ref 4.22–5.81)
RDW: 12.3 % (ref 11.5–15.5)
WBC: 12.7 10*3/uL — ABNORMAL HIGH (ref 4.0–10.5)
nRBC: 0 % (ref 0.0–0.2)

## 2021-05-17 LAB — TROPONIN I (HIGH SENSITIVITY)
Troponin I (High Sensitivity): 19 ng/L — ABNORMAL HIGH (ref <18)
Troponin I (High Sensitivity): 19 ng/L — ABNORMAL HIGH (ref <18)

## 2021-05-17 LAB — RESP PANEL BY RT-PCR (FLU A&B, COVID) ARPGX2
Influenza A by PCR: NEGATIVE
Influenza B by PCR: NEGATIVE
SARS Coronavirus 2 by RT PCR: NEGATIVE

## 2021-05-17 MED ORDER — SODIUM CHLORIDE 0.9 % IV BOLUS
1000.0000 mL | Freq: Once | INTRAVENOUS | Status: AC
  Administered 2021-05-17: 1000 mL via INTRAVENOUS

## 2021-05-17 NOTE — ED Provider Notes (Signed)
[facility] Provider Note    Event Date/Time   First MD Initiated Contact with Patient 05/17/21 1030     (approximate)  History   Chief Complaint: Near Syncope  HPI  Todd Craig is a 61 y.o. male with a past medical history of hypertension, presents to the emergency department for near syncope.  According to the patient he was feeling well this morning, states he walked to the store got back home, he states however shortly after getting back home he began feeling lightheaded and dizzy like he was going to pass out.  States he became mildly diaphoretic as well.  Patient states a history of syncopal episodes although states it has been at least 2 or 3 years since his last episode.  Patient denies any chest pain at any point.  Denies any abdominal pain recent nausea vomiting diarrhea fever cough or congestion.  Patient states he is feeling much better now.  Physical Exam   Triage Vital Signs: ED Triage Vitals  Enc Vitals Group     BP 05/17/21 1036 (!) 150/97     Pulse Rate 05/17/21 1036 78     Resp 05/17/21 1036 (!) 21     Temp 05/17/21 1036 98.5 F (36.9 C)     Temp Source 05/17/21 1036 Oral     SpO2 05/17/21 1036 97 %     Weight 05/17/21 1033 228 lb (103.4 kg)     Height 05/17/21 1033 6\' 2"  (1.88 m)     Head Circumference --      Peak Flow --      Pain Score 05/17/21 1033 0     Pain Loc --      Pain Edu? --      Excl. in [place]? --     Most recent vital signs: Vitals:   05/17/21 1036  BP: (!) 150/97  Pulse: 78  Resp: (!) 21  Temp: 98.5 F (36.9 C)  SpO2: 97%    General: Awake, no distress.  CV:  Good peripheral perfusion.  Regular rate and rhythm  Resp:  Normal effort.  Equal breath sounds bilaterally.  Abd:  No distention.  Soft, nontender.  No rebound or guarding. Other:  No lower extremity edema.   ED Results / Procedures / Treatments   EKG  EKG viewed and interpreted by myself shows a normal sinus rhythm 86 bpm with a narrow QRS,  normal axis, normal intervals, nonspecific ST changes.  No ST elevation.   MEDICATIONS ORDERED IN ED: Medications  sodium chloride 0.9 % bolus 1,000 mL (1,000 mLs Intravenous New Bag/Given 05/17/21 1042)     IMPRESSION / MDM / ASSESSMENT AND PLAN / ED COURSE  I reviewed the triage vital signs and the nursing notes.  Patient presents to the emergency department after near syncopal episode this morning.  Patient is EKG is reassuring.  Denies any chest pain at any point.  Patient does state a history of syncopal events in the past with his been 2 or 3 years since his last episode.  Differentials quite broad but would include ACS, vasovagal/orthostatic syncope, dehydration/hypovolemia, metabolic or electrolyte abnormality, infectious etiology.  We will check labs including cardiac enzymes.  We will obtain a COVID swab.  We will IV hydrate while awaiting further results.  Patient agreeable to plan of care.  Patient's repeat troponin remains unchanged at 19.  Given the patient's reassuring work-up, normal labs, reassuring physical exam I believe the patient is safe for discharge home with  PCP follow-up.  Discussed with the patient increasing fluids and following up with his doctor.  Patient agreeable to plan.  FINAL CLINICAL IMPRESSION(S) / ED DIAGNOSES   Near syncope  Rx / DC Orders   PCP follow-up Oral hydration  Note:  This document was prepared using Dragon voice recognition software and may include unintentional dictation errors.   Harvest Dark, MD 05/17/21 1323

## 2021-05-17 NOTE — ED Triage Notes (Signed)
Patient to ED via [facility] from group home (unsure of name per EMS) for near syncopal episode. Patient denies CP, N/V/D. ST elevation noted in leads 2-4 per EMS. Jacqualine Code, MD aware. Patient alert and orient with no complaints at this time.

## 2021-05-18 ENCOUNTER — Telehealth: Payer: Self-pay | Admitting: *Deleted

## 2021-05-18 NOTE — Telephone Encounter (Signed)
Transition Care Management Follow-up Telephone Call Date of discharge and from where: 05/17/2021 [facility] ED How have you been since you were released from the hospital? "Better than I did yesterday" Any questions or concerns? No  Items Reviewed: Did the pt receive and understand the discharge instructions provided? Yes  Medications obtained and verified?  N/A Other? No  Any new allergies since your discharge? No  Dietary orders reviewed? No Do you have support at home? Yes    Functional Questionnaire: (I = Independent and D = Dependent) ADLs: I  Bathing/Dressing- I  Meal Prep- I  Eating- I  Maintaining continence- I  Transferring/Ambulation- I  Managing Meds- I  Follow up appointments reviewed:  PCP Hospital f/u appt confirmed? Yes  Scheduled to see Marnee Guarneri, NP on 06/01/2021 @ 1040. [facility] f/u appt confirmed?  N/A   Are transportation arrangements needed? No  If their condition worsens, is the pt aware to call PCP or go to the Emergency Dept.? Yes Was the patient provided with contact information for the PCP's office or ED? Yes Was to pt encouraged to call back with questions or concerns? Yes

## 2021-05-26 ENCOUNTER — Ambulatory Visit
Admission: RE | Admit: 2021-05-26 | Discharge: 2021-05-26 | Disposition: A | Discharge status: Home / Self Care | Payer: Medicaid Other | Source: Ambulatory Visit | Attending: Nephrology | Admitting: Nephrology

## 2021-05-26 ENCOUNTER — Encounter
Admission: RE | Admit: 2021-05-26 | Discharge: 2021-05-26 | Disposition: A | Discharge status: Home / Self Care | Payer: Medicaid Other | Source: Ambulatory Visit | Attending: Nephrology | Admitting: Nephrology

## 2021-05-26 ENCOUNTER — Other Ambulatory Visit: Payer: Self-pay

## 2021-05-26 DIAGNOSIS — I10 Essential (primary) hypertension: Secondary | ICD-10-CM | POA: Diagnosis not present

## 2021-05-26 DIAGNOSIS — N2581 Secondary hyperparathyroidism of renal origin: Secondary | ICD-10-CM | POA: Insufficient documentation

## 2021-05-26 DIAGNOSIS — R8290 Unspecified abnormal findings in urine: Secondary | ICD-10-CM | POA: Diagnosis not present

## 2021-05-26 DIAGNOSIS — N281 Cyst of kidney, acquired: Secondary | ICD-10-CM | POA: Diagnosis not present

## 2021-05-26 DIAGNOSIS — R801 Persistent proteinuria, unspecified: Secondary | ICD-10-CM | POA: Diagnosis not present

## 2021-05-26 DIAGNOSIS — N1831 Chronic kidney disease, stage 3a: Secondary | ICD-10-CM | POA: Insufficient documentation

## 2021-05-26 DIAGNOSIS — E278 Other specified disorders of adrenal gland: Secondary | ICD-10-CM | POA: Diagnosis not present

## 2021-05-26 MED ORDER — TECHNETIUM TC 99M SESTAMIBI GENERIC - CARDIOLITE
25.0000 | Freq: Once | INTRAVENOUS | Status: AC | PRN
  Administered 2021-05-26: 25.05 via INTRAVENOUS

## 2021-06-01 ENCOUNTER — Other Ambulatory Visit: Payer: Self-pay

## 2021-06-01 ENCOUNTER — Encounter: Payer: Self-pay | Admitting: Nurse Practitioner

## 2021-06-01 ENCOUNTER — Ambulatory Visit: Payer: Medicaid Other | Admitting: Nurse Practitioner

## 2021-06-01 VITALS — BP 138/86 | HR 81 | Temp 98.4°F | Wt 229.4 lb

## 2021-06-01 DIAGNOSIS — R42 Dizziness and giddiness: Secondary | ICD-10-CM | POA: Insufficient documentation

## 2021-06-01 DIAGNOSIS — N1831 Chronic kidney disease, stage 3a: Secondary | ICD-10-CM

## 2021-06-01 DIAGNOSIS — H9193 Unspecified hearing loss, bilateral: Secondary | ICD-10-CM

## 2021-06-01 DIAGNOSIS — F17290 Nicotine dependence, other tobacco product, uncomplicated: Secondary | ICD-10-CM | POA: Diagnosis not present

## 2021-06-01 DIAGNOSIS — M7501 Adhesive capsulitis of right shoulder: Secondary | ICD-10-CM

## 2021-06-01 DIAGNOSIS — N2581 Secondary hyperparathyroidism of renal origin: Secondary | ICD-10-CM

## 2021-06-01 DIAGNOSIS — I10 Essential (primary) hypertension: Secondary | ICD-10-CM

## 2021-06-01 DIAGNOSIS — E7800 Pure hypercholesterolemia, unspecified: Secondary | ICD-10-CM | POA: Diagnosis not present

## 2021-06-01 DIAGNOSIS — R972 Elevated prostate specific antigen [PSA]: Secondary | ICD-10-CM

## 2021-06-01 DIAGNOSIS — E663 Overweight: Secondary | ICD-10-CM

## 2021-06-01 MED ORDER — ROSUVASTATIN CALCIUM 20 MG PO TABS: 20.0000 mg | ORAL_TABLET | Freq: Every day | ORAL | 4 refills | Status: DC

## 2021-06-01 NOTE — Patient Instructions (Signed)

## 2021-06-01 NOTE — Assessment & Plan Note (Signed)
Ongoing, continues to be elevated.  Denies any significant BPH symptoms.  Referral to urology placed last visit, but they report not hearing from them.  Will check on referral and recheck labs today.

## 2021-06-01 NOTE — Assessment & Plan Note (Signed)
Chronic, ongoing with elevation initial check and trending down -- has not taken medication this morning.  Continue current medication regimen and adjust as needed -- could consider BB if ongoing highs.  Recommend he monitor BP at least a few mornings a week at home and document. DASH diet at home.  Labs up to date.

## 2021-06-01 NOTE — Assessment & Plan Note (Signed)
Acute and improved at this time, he denies any further pain and has full ROM at this time.

## 2021-06-01 NOTE — Assessment & Plan Note (Signed)
I have recommended complete cessation of tobacco use. I have discussed various options available for assistance with tobacco cessation including over the counter methods (Nicotine gum, patch and lozenges). We also discussed prescription options (Chantix, Nicotine Inhaler / Nasal Spray). The patient is not interested in pursuing any prescription tobacco cessation options at this time.  

## 2021-06-01 NOTE — Assessment & Plan Note (Signed)
Ongoing, with elevations and ASCVD 25.1%.  Discussed with patient and fiance recommendations to start a low dose Rosuvastatin and educated on side effects.  Start Rosuvastatin 20 MG daily and monitor for side effects.

## 2021-06-01 NOTE — Assessment & Plan Note (Signed)
Acute and improved at this time with no further episodes, overall ER work-up reassuring.  Suspect related to dehydration.  Monitor closely and if ongoing episodes then plan on imaging and possible referral to neurology.  Recheck CBC today.

## 2021-06-01 NOTE — Assessment & Plan Note (Signed)
Chronic, ongoing.  Being followed by nephrology at this time, continue this collaboration and current BP medication regimen.  Recent labs and note reviewed.

## 2021-06-01 NOTE — Assessment & Plan Note (Signed)
Ongoing, being followed and having work-up with nephrology at this time.  Continue this collaboration, recent note and labs reviewed.

## 2021-06-01 NOTE — Assessment & Plan Note (Signed)
BMI 29.45.  Recommended eating smaller high protein, low fat meals more frequently and exercising 30 mins a day 5 times a week with a goal of 10-15lb weight loss in the next 3 months. Patient voiced their understanding and motivation to adhere to these recommendations.  

## 2021-06-01 NOTE — Progress Notes (Signed)
BP 138/86 (BP Location: Left Arm)    Pulse 81    Temp 98.4 F (36.9 C)    Wt 229 lb 6.4 oz (104.1 kg)    SpO2 100%    BMI 29.45 kg/m    Subjective:    Patient ID: Todd Craig, male    DOB: [birth year], 61 y.o.   MRN: [id number]  HPI: Todd Craig is a 61 y.o. male  Chief Complaint  Patient presents with   Hypertension   Shoulder Problem    Patient has not been able to make an appointment for Shoulder due to personal related issues.    Dizziness    Patient significant other states the patient was recently hospitalized for dizziness, sweating and off-balance. Ambulance was called and he went to the hospital and they performed all takes of test.    HYPERTENSION History of stroke in 2012 with some persistent memory loss. Losartan increased to 100 MG by nephrology and continues Amlodipine 10 MG  -- has not taken medication in 2 days as was at girlfriend's house, is taking today.   He is a current smoker -- started in his 27's, smokes about 1/2 PPD.  He is interested in quitting, but does not want medications, wished to try to cut back.  Previously drank 1 pint a week of liquor and beer, currently has cut back per his report - does live next door to liquor store. His fiance states he hardly drinks any liquor at this time. No family history of alcohol abuse. Hypertension status: stable Satisfied with current treatment? yes Duration of hypertension: chronic BP monitoring frequency:  not checking BP range:  BP medication side effects:  no Medication compliance: poor compliance Previous BP meds: none Aspirin: no Recurrent headaches: no Visual changes: no Palpitations: no Dyspnea: no Chest pain: no Lower extremity edema: no Dizzy/lightheaded: no  The 10-year ASCVD risk score (Arnett DK, et al., 2019) is: 25.1%   Values used to calculate the score:     Age: 36 years     Sex: Male     Is Non-Hispanic African American: Yes     Diabetic: No     Tobacco smoker: Yes     Systolic Blood  Pressure: 138 mmHg     Is BP treated: Yes     HDL Cholesterol: 43 mg/dL     Total Cholesterol: 199 mg/dL  DIZZINESS Recent ER visit for syncopal episode, like he was going to pass out.  He was sweating at time.  Had vertigo episodes 2-3 years ago, but this is worse.  Hearing is worsening -- would like referral to ENT.   EKG in ER showed NSR.  CBC at time showed elevation in WBC 12.7, Glucose 120, GFR 1.53, 52, Troponin 19.  He was given fluids and felt better.  They suspect dehydration.   Duration: days Description of symptoms: lightheaded Duration of episode: seconds Dizziness frequency: recurrent Provoking factors: none Aggravating factors:  none Triggered by rolling over in bed: no Triggered by bending over: no Aggravated by head movement: no Aggravated by exertion, coughing, loud noises: no Recent head injury: no Recent or current viral symptoms: no History of vasovagal episodes: no Nausea: no Vomiting: no Tinnitus: no Hearing loss: yes Aural fullness: no Headache: no Photophobia/phonophobia: no Unsteady gait: no Postural instability: no Diplopia, dysarthria, dysphagia or weakness: no Related to exertion: no Pallor: no Diaphoresis: yes Dyspnea: no Chest pain: no   CHRONIC KIDNEY DISEASE/HYPERPARATHYROID Is seeing nephrology, initial visit 05/08/21 -- increase  Losartan to 100 MG.  CRT 1.41, GFR 57, PTH 174.   CKD status: stable Medications renally dose: yes Previous renal evaluation: no Pneumovax:  Not up to Date Influenza Vaccine:  Not up to Date    SHOULDER PAIN (RIGHT) This has improved, no further pain. Duration: months Involved shoulder: right Mechanism of injury: unknown Location: anterior Onset:sudden Severity: 0/10  Status: improved Relief with NSAIDs?:  No NSAIDs Taken Weakness: no Numbness: no Decreased grip strength: no Redness: no Swelling: no Bruising: no Fevers: no   ELEVATED PSA Referral placed to urology, has not seen yet.  PSA 9.1 on  recent check. Duration: months Nocturia: no Urinary frequency: occasional Incomplete voiding: no Urgency: no Weak urinary stream: no Straining to start stream: no Dysuria: no  Relevant past medical, surgical, family and social history reviewed and updated as indicated. Interim medical history since our last visit reviewed. Allergies and medications reviewed and updated.  Review of Systems  Constitutional:  Positive for fatigue. Negative for activity change, appetite change and fever.  HENT:  Positive for hearing loss. Negative for ear discharge and ear pain.   Respiratory:  Negative for cough, chest tightness, shortness of breath and wheezing.   Cardiovascular: Negative.   Gastrointestinal: Negative.   Musculoskeletal:  Negative for arthralgias.  Neurological: Negative.   Psychiatric/Behavioral: Negative.     Per HPI unless specifically indicated above     Objective:    BP 138/86 (BP Location: Left Arm)    Pulse 81    Temp 98.4 F (36.9 C)    Wt 229 lb 6.4 oz (104.1 kg)    SpO2 100%    BMI 29.45 kg/m   Wt Readings from Last 3 Encounters:  06/01/21 229 lb 6.4 oz (104.1 kg)  05/17/21 228 lb (103.4 kg)  03/02/21 229 lb 3.2 oz (104 kg)    Physical Exam Vitals and nursing note reviewed.  Constitutional:      General: He is awake. He is not in acute distress.    Appearance: He is well-developed and well-groomed. He is not ill-appearing or toxic-appearing.  HENT:     Head: Normocephalic and atraumatic.     Right Ear: Hearing, ear canal and external ear normal. No drainage. There is impacted cerumen.     Left Ear: Hearing, ear canal and external ear normal. No drainage. There is impacted cerumen.     Ears:     Comments: Cerumen impacted bilaterally, ears irrigated by CMA with successful removal moderate cerumen bilaterally. Tolerated well with clearance noted on exam and able to view TM bilaterally, patient reported improved hearing.    Nose: Nose normal.     Mouth/Throat:      Pharynx: Uvula midline.  Eyes:     General: Lids are normal.        Right eye: No discharge.        Left eye: No discharge.     Extraocular Movements: Extraocular movements intact.     Conjunctiva/sclera: Conjunctivae normal.     Pupils: Pupils are equal, round, and reactive to light.     Visual Fields: Right eye visual fields normal and left eye visual fields normal.  Neck:     Thyroid: No thyromegaly.     Vascular: No carotid bruit.  Cardiovascular:     Rate and Rhythm: Normal rate and regular rhythm.     Heart sounds: Normal heart sounds, S1 normal and S2 normal. No murmur heard.   No gallop.  Pulmonary:  Effort: Pulmonary effort is normal. No accessory muscle usage or respiratory distress.     Breath sounds: Normal breath sounds.  Abdominal:     General: Bowel sounds are normal.     Palpations: Abdomen is soft. There is no hepatomegaly or splenomegaly.     Tenderness: There is no abdominal tenderness.  Musculoskeletal:     Right shoulder: Normal.     Left shoulder: Normal.     Cervical back: Normal range of motion and neck supple.     Right lower leg: No edema.     Left lower leg: No edema.  Lymphadenopathy:     Head:     Right side of head: No submental, submandibular, tonsillar, preauricular or posterior auricular adenopathy.     Left side of head: No submental, submandibular, tonsillar, preauricular or posterior auricular adenopathy.     Cervical: No cervical adenopathy.  Skin:    General: Skin is warm and dry.     Capillary Refill: Capillary refill takes less than 2 seconds.     Findings: No rash.  Neurological:     Mental Status: He is alert and oriented to person, place, and time.     Cranial Nerves: Cranial nerves 2-12 are intact.     Coordination: Coordination is intact.     Gait: Gait is intact.     Deep Tendon Reflexes: Reflexes are normal and symmetric.     Reflex Scores:      Brachioradialis reflexes are 2+ on the right side and 2+ on the left side.       Patellar reflexes are 2+ on the right side and 2+ on the left side.    Comments: Able to state day, month, year, location.  Psychiatric:        Attention and Perception: Attention normal.        Mood and Affect: Mood normal.        Speech: Speech normal.        Behavior: Behavior normal. Behavior is cooperative.        Thought Content: Thought content normal.   Results for orders placed or performed during the hospital encounter of 05/17/21  Resp Panel by RT-PCR (Flu A&B, Covid) Nasopharyngeal Swab   Specimen: Nasopharyngeal Swab; Nasopharyngeal(NP) swabs in vial transport medium  Result Value Ref Range   SARS Coronavirus 2 by RT PCR NEGATIVE NEGATIVE   Influenza A by PCR NEGATIVE NEGATIVE   Influenza B by PCR NEGATIVE NEGATIVE  CBC  Result Value Ref Range   WBC 12.7 (H) 4.0 - 10.5 K/uL   RBC 4.63 4.22 - 5.81 MIL/uL   Hemoglobin 15.6 13.0 - 17.0 g/dL   HCT 47.8 39.0 - 52.0 %   MCV 103.2 (H) 80.0 - 100.0 fL   MCH 33.7 26.0 - 34.0 pg   MCHC 32.6 30.0 - 36.0 g/dL   RDW 12.3 11.5 - 15.5 %   Platelets 205 150 - 400 K/uL   nRBC 0.0 0.0 - 0.2 %  Comprehensive metabolic panel  Result Value Ref Range   Sodium 135 135 - 145 mmol/L   Potassium 3.9 3.5 - 5.1 mmol/L   Chloride 104 98 - 111 mmol/L   CO2 27 22 - 32 mmol/L   Glucose, Bld 120 (H) 70 - 99 mg/dL   BUN 14 6 - 20 mg/dL   Creatinine, Ser 1.53 (H) 0.61 - 1.24 mg/dL   Calcium 11.0 (H) 8.9 - 10.3 mg/dL   Total Protein 7.3 6.5 - 8.1 g/dL  Albumin 4.1 3.5 - 5.0 g/dL   AST 15 15 - 41 U/L   ALT 18 0 - 44 U/L   Alkaline Phosphatase 74 38 - 126 U/L   Total Bilirubin 0.9 0.3 - 1.2 mg/dL   GFR, Estimated 52 (L) >60 mL/min   Anion gap 4 (L) 5 - 15  Troponin I (High Sensitivity)  Result Value Ref Range   Troponin I (High Sensitivity) 19 (H) <18 ng/L  Troponin I (High Sensitivity)  Result Value Ref Range   Troponin I (High Sensitivity) 19 (H) <18 ng/L      Assessment & Plan:   Problem List Items Addressed This Visit        Cardiovascular and Mediastinum   Primary hypertension    Chronic, ongoing with elevation initial check and trending down -- has not taken medication this morning.  Continue current medication regimen and adjust as needed -- could consider BB if ongoing highs.  Recommend he monitor BP at least a few mornings a week at home and document. DASH diet at home.  Labs up to date.       Relevant Medications   losartan (COZAAR) 100 MG tablet   rosuvastatin (CRESTOR) 20 MG tablet   Other Relevant Orders   CBC with Differential/Platelet     Endocrine   Secondary hyperparathyroidism of renal origin ([place])    Ongoing, being followed and having work-up with nephrology at this time.  Continue this collaboration, recent note and labs reviewed.        Musculoskeletal and Integument   Adhesive capsulitis of right shoulder    Acute and improved at this time, he denies any further pain and has full ROM at this time.        Genitourinary   Stage 3a chronic kidney disease (HCC) - Primary    Chronic, ongoing.  Being followed by nephrology at this time, continue this collaboration and current BP medication regimen.  Recent labs and note reviewed.        Other   Dizziness    Acute and improved at this time with no further episodes, overall ER work-up reassuring.  Suspect related to dehydration.  Monitor closely and if ongoing episodes then plan on imaging and possible referral to neurology.  Recheck CBC today.      Elevated PSA    Ongoing, continues to be elevated.  Denies any significant BPH symptoms.  Referral to urology placed last visit, but they report not hearing from them.  Will check on referral and recheck labs today.      Relevant Orders   PSA, total and free   Hypercholesteremia    Ongoing, with elevations and ASCVD 25.1%.  Discussed with patient and fiance recommendations to start a low dose Rosuvastatin and educated on side effects.  Start Rosuvastatin 20 MG daily and monitor for side  effects.      Relevant Medications   losartan (COZAAR) 100 MG tablet   rosuvastatin (CRESTOR) 20 MG tablet   Nicotine dependence due to vaping tobacco product    I have recommended complete cessation of tobacco use. I have discussed various options available for assistance with tobacco cessation including over the counter methods (Nicotine gum, patch and lozenges). We also discussed prescription options (Chantix, Nicotine Inhaler / Nasal Spray). The patient is not interested in pursuing any prescription tobacco cessation options at this time.       Overweight with body mass index (BMI) 25.0-29.9    BMI 29.45.  Recommended eating smaller  high protein, low fat meals more frequently and exercising 30 mins a day 5 times a week with a goal of 10-15lb weight loss in the next 3 months. Patient voiced their understanding and motivation to adhere to these recommendations.       Other Visit Diagnoses     Bilateral hearing loss, unspecified hearing loss type       Referral to ENT placed.   Relevant Orders   Ambulatory referral to ENT        Follow up plan: Return in about 6 weeks (around 07/13/2021) for HTN and DIZZINESS.

## 2021-06-02 LAB — CBC WITH DIFFERENTIAL/PLATELET
Basophils Absolute: 0.1 10*3/uL (ref 0.0–0.2)
Basos: 1 %
EOS (ABSOLUTE): 0.2 10*3/uL (ref 0.0–0.4)
Eos: 3 %
Hematocrit: 45 % (ref 37.5–51.0)
Hemoglobin: 15 g/dL (ref 13.0–17.7)
Immature Grans (Abs): 0.1 10*3/uL (ref 0.0–0.1)
Immature Granulocytes: 1 %
Lymphocytes Absolute: 1.8 10*3/uL (ref 0.7–3.1)
Lymphs: 18 %
MCH: 33.7 pg — ABNORMAL HIGH (ref 26.6–33.0)
MCHC: 33.3 g/dL (ref 31.5–35.7)
MCV: 101 fL — ABNORMAL HIGH (ref 79–97)
Monocytes Absolute: 0.5 10*3/uL (ref 0.1–0.9)
Monocytes: 5 %
Neutrophils Absolute: 7.1 10*3/uL — ABNORMAL HIGH (ref 1.4–7.0)
Neutrophils: 72 %
Platelets: 289 10*3/uL (ref 150–450)
RBC: 4.45 x10E6/uL (ref 4.14–5.80)
RDW: 11.8 % (ref 11.6–15.4)
WBC: 9.7 10*3/uL (ref 3.4–10.8)

## 2021-06-02 LAB — PSA, TOTAL AND FREE
PSA, Free Pct: 5.7 %
PSA, Free: 0.71 ng/mL
Prostate Specific Ag, Serum: 12.4 ng/mL — ABNORMAL HIGH (ref 0.0–4.0)

## 2021-06-02 NOTE — Progress Notes (Signed)
Good afternoon, please let Todd Craig -- his DPR, know that CBC shows normal levels.  Prostate lab has gone from 9.1 to 12.4 -- I do recommend he see urology ASAP.   They can try calling [phone] as referral is into [facility], was placed previous visit.  Please call right away.  Any questions? Keep being amazing!!  Thank you for allowing me to participate in your care.  I appreciate you. Kindest regards, Tucker Minter

## 2021-06-20 ENCOUNTER — Other Ambulatory Visit: Payer: Self-pay | Admitting: Nephrology

## 2021-06-20 DIAGNOSIS — N1831 Chronic kidney disease, stage 3a: Secondary | ICD-10-CM | POA: Diagnosis not present

## 2021-06-20 DIAGNOSIS — I10 Essential (primary) hypertension: Secondary | ICD-10-CM | POA: Diagnosis not present

## 2021-06-20 DIAGNOSIS — R801 Persistent proteinuria, unspecified: Secondary | ICD-10-CM | POA: Diagnosis not present

## 2021-06-20 DIAGNOSIS — E212 Other hyperparathyroidism: Secondary | ICD-10-CM

## 2021-06-20 DIAGNOSIS — N281 Cyst of kidney, acquired: Secondary | ICD-10-CM | POA: Diagnosis not present

## 2021-06-21 ENCOUNTER — Other Ambulatory Visit: Payer: Self-pay

## 2021-06-21 ENCOUNTER — Ambulatory Visit (INDEPENDENT_AMBULATORY_CARE_PROVIDER_SITE_OTHER): Payer: Medicaid Other | Admitting: Urology

## 2021-06-21 ENCOUNTER — Encounter: Payer: Self-pay | Admitting: Urology

## 2021-06-21 VITALS — BP 160/101 | HR 79 | Ht 74.0 in | Wt 239.0 lb

## 2021-06-21 DIAGNOSIS — R972 Elevated prostate specific antigen [PSA]: Secondary | ICD-10-CM | POA: Diagnosis not present

## 2021-06-21 LAB — MICROSCOPIC EXAMINATION: Bacteria, UA: NONE SEEN

## 2021-06-21 LAB — URINALYSIS, COMPLETE
Bilirubin, UA: NEGATIVE
Glucose, UA: NEGATIVE
Ketones, UA: NEGATIVE
Leukocytes,UA: NEGATIVE
Nitrite, UA: NEGATIVE
RBC, UA: NEGATIVE
Specific Gravity, UA: >1.03 — ABNORMAL HIGH (ref 1.005–1.030)
Urobilinogen, Ur: 2 mg/dL — ABNORMAL HIGH (ref 0.2–1.0)
pH, UA: 5.5 (ref 5.0–7.5)

## 2021-06-21 NOTE — Progress Notes (Unsigned)
06/21/2021 10:49 AM   Todd Craig Mar 05, 1961 [id number]  Referring provider: Venita Lick, NP [address] [place],  [place] [address]  Chief Complaint  Patient presents with   Elevated PSA    HPI: Todd Craig is a 61 y.o. male referred for evaluation of an elevated PSA.  Referred 2021 for PSA 6.4 and was a no-show Repeat PSA 03/02/2021 was 9.1 Repeat 06/01/2021 had increased to 12.4 No bothersome LUTS Denies dysuria, gross hematuria No flank, abdominal or pelvic pain No family history prostate cancer    PMH: Past Medical History:  Diagnosis Date   GERD (gastroesophageal reflux disease)    Hypertension    Hypertensive crisis, unspecified 01/11/2020   Stroke [facility])     Surgical History: Past Surgical History:  Procedure Laterality Date   right hand repair  1985   trauma with saw laceration     Home Medications:  Allergies as of 06/21/2021       Reactions   Tramadol    Other reaction(s): Other        Medication List        Accurate as of June 21, 2021 10:49 AM. If you have any questions, ask your nurse or doctor.          amLODipine 10 MG tablet Commonly known as: NORVASC Take 1 tablet (10 mg total) by mouth daily.   colchicine 0.6 MG tablet Take 1 tablet (0.6 mg total) by mouth daily for 5 doses.   losartan 100 MG tablet Commonly known as: COZAAR Take 100 mg by mouth daily.   rosuvastatin 20 MG tablet Commonly known as: Crestor Take 1 tablet (20 mg total) by mouth daily.        Allergies:  Allergies  Allergen Reactions   Tramadol     Other reaction(s): Other    Family History: No family history on file.  Social History:  reports that he has been smoking cigarettes. He has been smoking an average of 1 pack per day. He has never used smokeless tobacco. He reports current alcohol use of about 5.0 standard drinks per week. He reports that he does not use drugs.   Physical Exam: BP (!) 160/101    Pulse 79    Ht '6\' 2"'$  (1.88 m)    Wt  239 lb (108.4 kg)    BMI 30.69 kg/m   Constitutional:  Alert and oriented, No acute distress. HEENT:  AT, moist mucus membranes.  Trachea midline, no masses. Cardiovascular: No clubbing, cyanosis, or edema. Respiratory: Normal respiratory effort, no increased work of breathing. GI: Abdomen is soft, nontender, nondistended, no abdominal masses GU: Prostate 40 g L>R.  No nodules or induration.  Significant tenderness anal canal making exam suboptimal Skin: No rashes, bruises or suspicious lesions. Neurologic: Grossly intact, no focal deficits, moving all 4 extremities. Psychiatric: Normal mood and affect.  Laboratory Data:  Urinalysis Dipstick/microscopy negative   Assessment & Plan:    1.  Elevated PSA Although PSA is a prostate cancer screening test he was informed that cancer is not the most common cause of an elevated PSA. Other potential causes including BPH and inflammation were discussed. He was informed that the only way to adequately diagnose prostate cancer would be a transrectal ultrasound and biopsy of the prostate. The procedure was discussed including potential risks of bleeding and infection/sepsis. He was also informed that a negative biopsy does not conclusively rule out the possibility that prostate cancer may be present and that continued monitoring  is required. The use of newer adjunctive blood tests including 4kScore was discussed. The use of multiparametric prostate MRI to identify abnormality suspicious for high-grade prostate cancer was reviewed.  Continued periodic surveillance was also discussed but not recommended. We discussed that prostate cancer is more likely with a PSA >10 and his PSA has continued to rise since 2021 I recommended scheduling TRUS/biopsy of the prostate Based on today's exam I do not think he would be able to tolerate insertion of the transrectal ultrasound probe in an office-based setting.  He desires to have this performed in same-day surgery  under sedation. ***Booking sheet   Abbie Sons, MD  [facility] [address], [place], [place] [address] [phone]

## 2021-06-22 ENCOUNTER — Encounter: Payer: Self-pay | Admitting: Urology

## 2021-06-28 ENCOUNTER — Ambulatory Visit: Payer: Medicaid Other

## 2021-06-28 ENCOUNTER — Other Ambulatory Visit: Payer: Self-pay | Admitting: Urology

## 2021-06-28 DIAGNOSIS — R972 Elevated prostate specific antigen [PSA]: Secondary | ICD-10-CM

## 2021-06-28 NOTE — Progress Notes (Signed)
Surgical Physician Order Form [facility]  ? ?* Scheduling expectation : Next Available ? ?*Length of Case: 20 ? ?*Clearance needed: no ? ?*Anticoagulation Instructions: N/A ? ?*Aspirin Instructions: N/A ? ?*Post-op visit Date/Instructions:  1-2 week with pathology review ? ?*Diagnosis: Elevated PSA ? ?*Procedure:  Prostate Biopsy (60600) TRUS (45997) [place] FSFSE(39532) Nerve block (02334) no nerve block ? ? ?Additional orders: N/A ? ?-Admit type: OUTpatient ? ?-Anesthesia: MAC ? ?-VTE Prophylaxis Standing Order SCD?s    ?   ?Other:  ? ?-Standing Lab Orders Per Anesthesia   ? ?Lab other: None ? ?-Standing Test orders EKG/Chest x-ray per Anesthesia      ? ?Test other:  ? ?- Medications:  Ceftriaxone(Rocephin) 1gm IV ? ?-Other orders:  Fleets enema AM ? ? ? ?  ? ?

## 2021-07-04 ENCOUNTER — Telehealth: Payer: Self-pay

## 2021-07-04 NOTE — Progress Notes (Signed)
[facility] Posting Form  ? ?Surgery Date/Time: Date: 08/01/2021 ? ?Surgeon: Dr. John Giovanni, MD ? ?Surgery Location: Day Surgery ? ?Inpt ( No  )   Outpt (Yes)   Obs ( No  )  ? ?Diagnosis: Elevated PSA R97.20 ? ?-CPT: [place], [id number], [id number], [id number] ? ?Surgery: Transrectal Ultrasound Guided Prostate Biopsy ? ?Stop Anticoagulations: N/A ? ?Cardiac/Medical/Pulmonary Clearance needed: No ? ?*Orders entered into EPIC  Date: 07/04/21  ? ?*Case booked in [place]  Date: 07/04/21 ? ?*Notified pt of Surgery: Date: 07/03/2021 ? ?PRE-OP UA & CX: no ? ?*Placed into Prior Authorization Work Que Date: 07/04/21 ? ? ?Assistant/laser/rep:No ? ? ? ? ? ? ? ? ? ? ? ? ? ? ? ?

## 2021-07-04 NOTE — Telephone Encounter (Signed)
I spoke with Everado's Significant other [name]. We have discussed possible surgery dates and Tuesday April 18th, 2023 was agreed upon by all parties. Patient given information about surgery date, what to expect pre-operatively and post operatively.  ?We discussed that a Pre-Admission Testing office will be calling to set up the pre-op visit that will take place prior to surgery, and that these appointments are typically done over the phone with a Pre-Admissions RN.  ?Informed patient that our office will communicate any additional care to be provided after surgery. Patients questions or concerns were discussed during our call. Advised to call our office should there be any additional information, questions or concerns that arise. Patient verbalized understanding.  ? ?

## 2021-07-09 DIAGNOSIS — N281 Cyst of kidney, acquired: Secondary | ICD-10-CM | POA: Insufficient documentation

## 2021-07-09 NOTE — Patient Instructions (Incomplete)
Chronic Kidney Disease, Adult Chronic kidney disease is when lasting damage happens to the kidneys slowly over a long time. The kidneys help to: Make pee (urine). Make hormones. Keep the right amount of fluids and chemicals in the body. Most often, this disease does not go away. You must take steps to help keep the kidney damage from getting worse. If steps are not taken, the kidneys might stop working forever. What are the causes? Diabetes. High blood pressure. Diseases that affect the heart and blood vessels. Other kidney diseases. Diseases of the body's disease-fighting system. A problem with the flow of pee. Infections of the organs that make pee, store it, and take it out of the body. Swelling or irritation of your blood vessels. What increases the risk? Getting older. Having someone in your family who has kidney disease or kidney failure. Having a disease caused by genes. Taking medicines often that harm the kidneys. Being near or having contact with harmful substances. Being very overweight. Using tobacco now or in the past. What are the signs or symptoms? Feeling very tired. Having a swollen face, legs, ankles, or feet. Feeling like you may vomit or vomiting. Not feeling hungry. Being confused or not able to focus. Twitches and cramps in the leg muscles or other muscles. Dry, itchy skin. A taste of metal in your mouth. Making less pee, or making more pee. Shortness of breath. Trouble sleeping. You may also become anemic or get weak bones. Anemic means there is not enough red blood cells or hemoglobin in your blood. You may get symptoms slowly. You may not notice them until the kidney damage gets very bad. How is this treated? Often, there is no cure for this disease. Treatment can help with symptoms and help keep the disease from getting worse. You may need to: Avoid alcohol. Avoid foods that are high in salt, potassium, phosphorous, and protein. Take medicines for  symptoms and to help control other conditions. Have dialysis. This treatment gets harmful waste out of your body. Treat other problems that cause your kidney disease or make it worse. Follow these instructions at home: Medicines Take over-the-counter and prescription medicines only as told by your doctor. Do not take any new medicines, vitamins, or supplements unless your doctor says it is okay. Lifestyle  Do not smoke or use any products that contain nicotine or tobacco. If you need help quitting, ask your doctor. If you drink alcohol: Limit how much you use to: 0-1 drink a day for women who are not pregnant. 0-2 drinks a day for men. Know how much alcohol is in your drink. In the [place] one drink equals one 12 oz bottle of beer (355 mL), one 5 oz glass of wine (148 mL), or one 1 oz glass of hard liquor (44 mL). Stay at a healthy weight. If you need help losing weight, ask your doctor. General instructions  Follow instructions from your doctor about what you cannot eat or drink. Track your blood pressure at home. Tell your doctor about any changes. If you have diabetes, track your blood sugar. Exercise at least 30 minutes a day, 5 days a week. Keep your shots (vaccinations) up to date. Keep all follow-up visits. Where to find more information American Association of Kidney Patients: www.aakp.org National Kidney Foundation: www.kidney.org American Kidney Fund: www.akfinc.org Life Options: www.lifeoptions.org Kidney School: www.kidneyschool.org Contact a doctor if: Your symptoms get worse. You get new symptoms. Get help right away if: You get symptoms of end-stage kidney disease. These   include: Headaches. Losing feeling in your hands or feet. Easy bruising. Having hiccups often. Chest pain. Shortness of breath. Lack of menstrual periods, in women. You have a fever. You make less pee than normal. You have pain or you bleed when you pee or poop. These symptoms may be an  emergency. Get help right away. Call your local emergency services (911 in the [place] Do not wait to see if the symptoms will go away. Do not drive yourself to the hospital. Summary Chronic kidney disease is when lasting damage happens to the kidneys slowly over a long time. Causes of this disease include diabetes and high blood pressure. Often, there is no cure for this disease. Treatment can help symptoms and help keep the disease from getting worse. Treatment may involve lifestyle changes, medicines, and dialysis. This information is not intended to replace advice given to you by your health care provider. Make sure you discuss any questions you have with your health care provider. Document Revised: 07/08/2019 Document Reviewed: 07/08/2019 Elsevier Patient Education  2022 Elsevier Inc.  

## 2021-07-12 ENCOUNTER — Other Ambulatory Visit: Payer: Self-pay | Admitting: Urology

## 2021-07-12 DIAGNOSIS — R972 Elevated prostate specific antigen [PSA]: Secondary | ICD-10-CM

## 2021-07-13 ENCOUNTER — Ambulatory Visit: Payer: Medicaid Other | Admitting: Nurse Practitioner

## 2021-07-17 ENCOUNTER — Ambulatory Visit: Admission: RE | Admit: 2021-07-17 | Payer: Medicaid Other | Source: Ambulatory Visit

## 2021-07-24 ENCOUNTER — Other Ambulatory Visit
Admission: RE | Admit: 2021-07-24 | Discharge: 2021-07-24 | Disposition: A | Discharge status: Home / Self Care | Payer: Medicaid Other | Source: Ambulatory Visit | Attending: Urology | Admitting: Urology

## 2021-07-24 NOTE — Patient Instructions (Addendum)
?Your procedure is scheduled on: Tuesday August 01, 2021. ?Report to Day Surgery inside [facility] 2nd floor, stop by admissions desk before getting on elevator. ?To find out your arrival time please call [phone] between 1PM - 3PM on Monday April, 17, 2023. ? ?Remember: Instructions that are not followed completely may result in serious medical risk,  ?up to and including death, or upon the discretion of your surgeon and anesthesiologist your  ?surgery may need to be rescheduled.  ? ?  _X__ 1. Do not eat food or drink fluids after midnight the night before your procedure. ?                No chewing gum or hard candies.  ? ?__X__2.  On the morning of surgery brush your teeth with toothpaste and water, you ?               may rinse your mouth with mouthwash if you wish.  Do not swallow any toothpaste or mouthwash. ?   ? _X__ 3.  No Alcohol for 24 hours before or after surgery. ? ? _X__ 4.  Do Not Smoke or use e-cigarettes For 24 Hours Prior to Your Surgery. ?                Do not use any chewable tobacco products for at least 6 hours prior to ?                Surgery. ? ?_X__  5.  Do not use any recreational drugs (marijuana, cocaine, heroin, ecstasy, MDMA or other) ?               For at least one week prior to your surgery.  Combination of these drugs with anesthesia ?               May have life threatening results. ? ?____  6.  Bring all medications with you on the day of surgery if instructed.  ? ?__X__  7.  Notify your doctor if there is any change in your medical condition  ?    (cold, fever, infections). ?    ?Do not wear jewelry, make-up, hairpins, clips or nail polish. ?Do not wear lotions, powders, or perfumes. You may wear deodorant. ?Do not shave 48 hours prior to surgery. Men may shave face and neck. ?Do not bring valuables to the hospital.   ? ?[facility] is not responsible for any belongings or valuables. ? ?Contacts, dentures or bridgework may not be worn into  surgery. ?Leave your suitcase in the car. After surgery it may be brought to your room. ?For patients admitted to the hospital, discharge time is determined by your ?treatment team. ?  ?Patients discharged the day of surgery will not be allowed to drive home.   ?Make arrangements for someone to be with you for the first 24 hours of your ?Same Day Discharge. ? ? ?__X__ Take these medicines the morning of surgery with A SIP OF WATER:  ? ? 1. amLODipine (NORVASC) 10 MG  ? 2. rosuvastatin (CRESTOR) 20 MG ? 3.  ? 4. ? 5. ? 6. ? ?__X__ Fleet Enema (as directed)  ? ?____ Use CHG Soap (or wipes) as directed ? ?____ Use Benzoyl Peroxide Gel as instructed ? ?____ Use inhalers on the day of surgery ? ?____ Stop metformin 2 days prior to surgery   ? ?____ Take 1/2 of usual insulin dose the night before surgery. No insulin the morning ?  of surgery.  ? ?____ Call your PCP, cardiologist, or Pulmonologist if taking Coumadin/Plavix/aspirin and ask when to stop before your surgery.  ? ?__X__ One Week prior to surgery- Stop Anti-inflammatories such as Ibuprofen, Aleve, Advil, Motrin, meloxicam (MOBIC), diclofenac, etodolac, ketorolac, Toradol, Daypro, piroxicam, Goody's or BC powders. OK TO USE TYLENOL IF NEEDED ?  ?__X__ Stop supplements until after surgery.   ? ?____ Bring C-Pap to the hospital.  ? ? ?If you have any questions regarding your pre-procedure instructions,  ?Please call Pre-admit Testing at [phone] ?

## 2021-08-01 ENCOUNTER — Ambulatory Visit
Admission: RE | Admit: 2021-08-01 | Discharge: 2021-08-01 | Disposition: A | Discharge status: Home / Self Care | Payer: Medicaid Other | Source: Ambulatory Visit | Attending: Urology | Admitting: Urology

## 2021-08-01 ENCOUNTER — Ambulatory Visit: Payer: Medicaid Other | Admitting: Certified Registered"

## 2021-08-01 ENCOUNTER — Encounter: Payer: Self-pay | Admitting: Urology

## 2021-08-01 ENCOUNTER — Other Ambulatory Visit: Payer: Self-pay

## 2021-08-01 ENCOUNTER — Ambulatory Visit
Admission: RE | Admit: 2021-08-01 | Discharge: 2021-08-01 | Disposition: A | Discharge status: Home / Self Care | Payer: Medicaid Other | Attending: Urology | Admitting: Urology

## 2021-08-01 ENCOUNTER — Ambulatory Visit
Admission: RE | Admit: 2021-08-01 | Discharge: 2021-08-01 | Disposition: A | Discharge status: Home / Self Care | Payer: Medicaid Other | Source: Home / Self Care | Attending: Urology | Admitting: Urology

## 2021-08-01 ENCOUNTER — Encounter
Admission: RE | Disposition: A | Discharge status: Home / Self Care | Payer: Self-pay | Source: Home / Self Care | Attending: Urology

## 2021-08-01 DIAGNOSIS — K219 Gastro-esophageal reflux disease without esophagitis: Secondary | ICD-10-CM | POA: Insufficient documentation

## 2021-08-01 DIAGNOSIS — R972 Elevated prostate specific antigen [PSA]: Secondary | ICD-10-CM

## 2021-08-01 DIAGNOSIS — F17210 Nicotine dependence, cigarettes, uncomplicated: Secondary | ICD-10-CM | POA: Insufficient documentation

## 2021-08-01 DIAGNOSIS — I129 Hypertensive chronic kidney disease with stage 1 through stage 4 chronic kidney disease, or unspecified chronic kidney disease: Secondary | ICD-10-CM | POA: Diagnosis not present

## 2021-08-01 DIAGNOSIS — C679 Malignant neoplasm of bladder, unspecified: Secondary | ICD-10-CM | POA: Diagnosis not present

## 2021-08-01 DIAGNOSIS — C61 Malignant neoplasm of prostate: Secondary | ICD-10-CM | POA: Diagnosis not present

## 2021-08-01 DIAGNOSIS — N183 Chronic kidney disease, stage 3 unspecified: Secondary | ICD-10-CM | POA: Insufficient documentation

## 2021-08-01 HISTORY — PX: TRANSRECTAL ULTRASOUND: SHX5146

## 2021-08-01 HISTORY — PX: PROSTATE BIOPSY: SHX241

## 2021-08-01 SURGERY — ULTRASOUND, RECTAL APPROACH
Anesthesia: General

## 2021-08-01 MED ORDER — PROPOFOL 500 MG/50ML IV EMUL
INTRAVENOUS | Status: AC
  Filled 2021-08-01: qty 50

## 2021-08-01 MED ORDER — SODIUM CHLORIDE 0.9 % IV SOLN
1.0000 g | INTRAVENOUS | Status: AC
  Administered 2021-08-01: 1 g via INTRAVENOUS
  Filled 2021-08-01: qty 1

## 2021-08-01 MED ORDER — FLEET ENEMA 7-19 GM/118ML RE ENEM: 1.0000 | ENEMA | Freq: Once | RECTAL | Status: DC

## 2021-08-01 MED ORDER — ORAL CARE MOUTH RINSE: 15.0000 mL | Freq: Once | OROMUCOSAL | Status: AC

## 2021-08-01 MED ORDER — FAMOTIDINE 20 MG PO TABS
ORAL_TABLET | ORAL | Status: AC
  Administered 2021-08-01: 20 mg via ORAL
  Filled 2021-08-01: qty 1

## 2021-08-01 MED ORDER — LIDOCAINE HCL (CARDIAC) PF 100 MG/5ML IV SOSY
PREFILLED_SYRINGE | INTRAVENOUS | Status: DC | PRN
  Administered 2021-08-01: 40 mg via INTRAVENOUS

## 2021-08-01 MED ORDER — OXYCODONE HCL 5 MG/5ML PO SOLN: 5.0000 mg | Freq: Once | ORAL | Status: DC | PRN

## 2021-08-01 MED ORDER — ACETAMINOPHEN 10 MG/ML IV SOLN
INTRAVENOUS | Status: AC
  Filled 2021-08-01: qty 100

## 2021-08-01 MED ORDER — FAMOTIDINE 20 MG PO TABS: 20.0000 mg | ORAL_TABLET | Freq: Once | ORAL | Status: AC

## 2021-08-01 MED ORDER — DEXMEDETOMIDINE HCL IN NACL 200 MCG/50ML IV SOLN
INTRAVENOUS | Status: DC | PRN
Start: 2021-08-01 — End: 2021-08-01
  Administered 2021-08-01: 8 ug via INTRAVENOUS

## 2021-08-01 MED ORDER — MIDAZOLAM HCL 2 MG/2ML IJ SOLN
INTRAMUSCULAR | Status: AC
  Filled 2021-08-01: qty 2

## 2021-08-01 MED ORDER — FENTANYL CITRATE (PF) 100 MCG/2ML IJ SOLN
INTRAMUSCULAR | Status: AC
  Filled 2021-08-01: qty 2

## 2021-08-01 MED ORDER — CHLORHEXIDINE GLUCONATE 0.12 % MT SOLN: 15.0000 mL | Freq: Once | OROMUCOSAL | Status: AC

## 2021-08-01 MED ORDER — PROPOFOL 500 MG/50ML IV EMUL
INTRAVENOUS | Status: DC | PRN
  Administered 2021-08-01: 150 ug/kg/min via INTRAVENOUS

## 2021-08-01 MED ORDER — OXYCODONE HCL 5 MG PO TABS: 5.0000 mg | ORAL_TABLET | Freq: Once | ORAL | Status: DC | PRN

## 2021-08-01 MED ORDER — FENTANYL CITRATE (PF) 100 MCG/2ML IJ SOLN
25.0000 ug | INTRAMUSCULAR | Status: DC | PRN
  Administered 2021-08-01 (×2): 50 ug via INTRAVENOUS

## 2021-08-01 MED ORDER — ACETAMINOPHEN 10 MG/ML IV SOLN
1000.0000 mg | Freq: Once | INTRAVENOUS | Status: DC | PRN
  Administered 2021-08-01: 1000 mg via INTRAVENOUS

## 2021-08-01 MED ORDER — MIDAZOLAM HCL 2 MG/2ML IJ SOLN
INTRAMUSCULAR | Status: DC | PRN
  Administered 2021-08-01: 2 mg via INTRAVENOUS

## 2021-08-01 MED ORDER — CHLORHEXIDINE GLUCONATE 0.12 % MT SOLN
OROMUCOSAL | Status: AC
  Administered 2021-08-01: 15 mL via OROMUCOSAL
  Filled 2021-08-01: qty 15

## 2021-08-01 MED ORDER — LIDOCAINE HCL (PF) 2 % IJ SOLN
INTRAMUSCULAR | Status: AC
  Filled 2021-08-01: qty 5

## 2021-08-01 MED ORDER — SODIUM CHLORIDE 0.9 % IV SOLN: INTRAVENOUS | Status: DC

## 2021-08-01 MED ORDER — LACTATED RINGERS IV SOLN: INTRAVENOUS | Status: DC

## 2021-08-01 MED ORDER — ONDANSETRON HCL 4 MG/2ML IJ SOLN: 4.0000 mg | Freq: Once | INTRAMUSCULAR | Status: DC | PRN

## 2021-08-01 SURGICAL SUPPLY — 8 items
COVER MAYO STAND REUSABLE (DRAPES) ×2 IMPLANT
GAUZE SPONGE 4X4 12PLY STRL (GAUZE/BANDAGES/DRESSINGS) ×1 IMPLANT
GLOVE SURG UNDER POLY LF SZ7.5 (GLOVE) ×2 IMPLANT
INST BIOPSY MAXCORE 18GX25 (NEEDLE) ×2 IMPLANT
KIT TURNOVER CYSTO (KITS) ×2 IMPLANT
MANIFOLD NEPTUNE II (INSTRUMENTS) ×1 IMPLANT
SURGILUBE 2OZ TUBE FLIPTOP (MISCELLANEOUS) ×2 IMPLANT
WATER STERILE IRR 500ML POUR (IV SOLUTION) ×2 IMPLANT

## 2021-08-01 NOTE — Discharge Instructions (Addendum)
Post prostate biopsy instructions ? ?No driving or operating machinery for 24 hours ?You may resume regular activities in 24 hours ?It is normal to see from the rectum and urine ?Blood in the semen will be common for several weeks ?Contact our office at [phone] or proceed to the ED for severe bleeding from the rectum or urine or fever greater than 101 degrees ?You will be scheduled for a follow-up appointment for biopsy results ? ?AMBULATORY SURGERY  ?DISCHARGE INSTRUCTIONS ? ? ?The drugs that you were given will stay in your system until tomorrow so for the next 24 hours you should not: ? ?Drive an automobile ?Make any legal decisions ?Drink any alcoholic beverage ? ? ?You may resume regular meals tomorrow.  Today it is better to start with liquids and gradually work up to solid foods. ? ?You may eat anything you prefer, but it is better to start with liquids, then soup and crackers, and gradually work up to solid foods. ? ? ?Please notify your doctor immediately if you have any unusual bleeding, trouble breathing, redness and pain at the surgery site, drainage, fever, or pain not relieved by medication. ? ? ? ?Additional Instructions: ? ?You received 1,000 mg of tylenol IV at 0943 am ?08/01/2021 ? ? ? ? ? ? ?Please contact your physician with any problems or Same Day Surgery at [phone], Monday through Friday 6 am to 4 pm, or [place] at [facility] number at [phone].  ?

## 2021-08-01 NOTE — H&P (Signed)
? ?  Urology H&P ? ? ?Chief Complaint: Elevated PSA ? ?History of Present Illness: Todd Craig is a 61 y.o. with a rising PSA.  Most recent level was 12.4.  He was unable to tolerate DRE in office and presents for prostate biopsy under sedation ? ?Past Medical History:  ?Diagnosis Date  ? GERD (gastroesophageal reflux disease)   ? Hypertension   ? Hypertensive crisis, unspecified 01/11/2020  ? Stroke [facility]) 2014  ? ? ?Past Surgical History:  ?Procedure Laterality Date  ? right hand repair  1985  ? trauma with saw laceration   ? ? ?Home Medications:  ?No outpatient medications have been marked as taking for the 08/01/21 encounter [facility] Encounter).  ? ? ?Allergies:  ?Allergies  ?Allergen Reactions  ? Tramadol   ?  Other reaction(s): Other  ? ? ?History reviewed. No pertinent family history. ? ?Social History:  reports that he has been smoking cigarettes. He has been smoking an average of .5 packs per day. He has never used smokeless tobacco. He reports current alcohol use of about 5.0 standard drinks per week. He reports that he does not use drugs. ? ?ROS: ?No fever, chills or voiding complaints ? ?Physical Exam:  ?Vital signs in last 24 hours: ?Temp:  [97.6 ?F (36.4 ?C)] 97.6 ?F (36.4 ?C) (04/18 0511) ?Pulse Rate:  [85] 85 (04/18 0624) ?Resp:  [22] 22 (04/18 0211) ?BP: (157)/(80) 157/80 (04/18 1735) ?SpO2:  [99 %] 99 % (04/18 0624) ?Weight:  [104.8 kg] 104.8 kg (04/18 0624) ?Constitutional:  Alert and oriented, No acute distress ?HEENT: [place] AT, moist mucus membranes.  Trachea midline, no masses ?Cardiovascular: Regular rate and rhythm, no clubbing, cyanosis, or edema. ?Respiratory: Normal respiratory effort, lungs clear bilaterally ?Psychiatric: Normal mood and affect ? ? ?Laboratory Data:  ?No results for input(s): WBC, HGB, HCT in the last 72 hours. ?No results for input(s): NA, K, CL, CO2, GLUCOSE, BUN, CREATININE, CALCIUM in the last 72 hours. ?No results for input(s): LABPT, INR in the last 72 hours. ?No results  for input(s): LABURIN in the last 72 hours. ?Results for orders placed or performed in visit on 06/21/21  ?Microscopic Examination     Status: Abnormal  ? Collection Time: 06/21/21 11:08 AM  ? Urine  ?Result Value Ref Range Status  ? WBC, UA 0-5 0 - 5 /hpf Final  ? RBC 0-2 0 - 2 /hpf Final  ? Epithelial Cells (non renal) 0-10 0 - 10 /hpf Final  ? Casts Present (A) None seen /lpf Final  ? Cast Type Hyaline casts N/A Final  ? Bacteria, UA None seen None seen/Few Final  ? ? ? ?Impression/Assessment:  ?Elevated PSA ? ?Plan:  ?Scheduled for TRUS/biopsy of prostate under sedation ?The procedure has been discussed including potential risks of bleeding and infection/sepsis. ? ?08/01/2021, 7:07 AM  ?Todd Giovanni,  MD ? ? ? ?

## 2021-08-01 NOTE — Transfer of Care (Signed)
Immediate Anesthesia Transfer of Care Note ? ?Patient: Todd Craig ? ?Procedure(s) Performed: TRANSRECTAL ULTRASOUND ?PROSTATE BIOPSY ? ?Patient Location: PACU ? ?Anesthesia Type:General ? ?Level of Consciousness: drowsy ? ?Airway & Oxygen Therapy: Patient Spontanous Breathing and Patient connected to face mask oxygen ? ?Post-op Assessment: Report given to RN and Post -op Vital signs reviewed and stable ? ?Post vital signs: Reviewed and stable ? ?Last Vitals:  ?Vitals Value Taken Time  ?BP 114/82 08/01/21 0908  ?Temp 36.4 ?C 08/01/21 0908  ?Pulse 72 08/01/21 0914  ?Resp 15 08/01/21 0914  ?SpO2 100 % 08/01/21 0914  ?Vitals shown include unvalidated device data. ? ?Last Pain:  ?Vitals:  ? 08/01/21 0908  ?TempSrc:   ?PainSc: Asleep  ?   ? ?  ? ?Complications: No notable events documented. ?

## 2021-08-01 NOTE — Anesthesia Postprocedure Evaluation (Signed)
Anesthesia Post Note ? ?Patient: Todd Craig ? ?Procedure(s) Performed: TRANSRECTAL ULTRASOUND ?PROSTATE BIOPSY ? ?Patient location during evaluation: PACU ?Anesthesia Type: General ?Level of consciousness: awake and alert, oriented and patient cooperative ?Pain management: pain level controlled ?Vital Signs Assessment: post-procedure vital signs reviewed and stable ?Respiratory status: spontaneous breathing, nonlabored ventilation and respiratory function stable ?Cardiovascular status: blood pressure returned to baseline and stable ?Postop Assessment: adequate PO intake ?Anesthetic complications: no ? ? ?No notable events documented. ? ? ?Last Vitals:  ?Vitals:  ? 08/01/21 1000 08/01/21 1011  ?BP: 124/88 (!) 152/99  ?Pulse: 68 74  ?Resp: 15   ?Temp: 36.8 ?C (!) 36.3 ?C  ?SpO2: 96% 100%  ?  ?Last Pain:  ?Vitals:  ? 08/01/21 1011  ?TempSrc: Temporal  ?PainSc: 2   ? ? ?  ?  ?  ?  ?  ?  ? ?Darrin Nipper ? ? ? ? ?

## 2021-08-01 NOTE — Anesthesia Preprocedure Evaluation (Addendum)
Anesthesia Evaluation  ?Patient identified by MRN, date of birth, ID band ?Patient awake ? ? ? ?Reviewed: ?Allergy & Precautions, NPO status , Patient's Chart, lab work & pertinent test results ? ?History of Anesthesia Complications ?Negative for: history of anesthetic complications ? ?Airway ?Mallampati: IV ? ? ?Neck ROM: Full ? ? ? Dental ? ?(+) Chipped, Missing ?  ?Pulmonary ?Current Smoker (4-5 cigarettes per day) and Patient abstained from smoking.,  ?  ?Pulmonary exam normal ?breath sounds clear to auscultation ? ? ? ? ? ? Cardiovascular ?Exercise Tolerance: Good ?hypertension, Normal cardiovascular exam ?Rhythm:Regular Rate:Normal ? ?ECG 05/17/21:  ?Sinus rhythm ?Consider left ventricular hypertrophy ?Nonspecific T abnormalities, inferior leads ?ST elevation, consider anterior injury ?  ?Neuro/Psych ?CVA (2014), No Residual Symptoms   ? GI/Hepatic ?GERD  ,  ?Endo/Other  ?negative endocrine ROS ? Renal/GU ?Renal disease (stage III CKD)  ? ?  ?Musculoskeletal ? ? Abdominal ?  ?Peds ? Hematology ?negative hematology ROS ?(+)   ?Anesthesia Other Findings ? ? Reproductive/Obstetrics ? ?  ? ? ? ? ? ? ? ? ? ? ? ? ? ?  ?  ? ? ? ? ? ? ? ?Anesthesia Physical ?Anesthesia Plan ? ?ASA: 2 ? ?Anesthesia Plan: General  ? ?Post-op Pain Management:   ? ?Induction: Intravenous ? ?PONV Risk Score and Plan: 1 and Propofol infusion, TIVA, Treatment may vary due to age or medical condition and Ondansetron ? ?Airway Management Planned: Natural Airway ? ?Additional Equipment:  ? ?Intra-op Plan:  ? ?Post-operative Plan:  ? ?Informed Consent: I have reviewed the patients History and Physical, chart, labs and discussed the procedure including the risks, benefits and alternatives for the proposed anesthesia with the patient or authorized representative who has indicated his/her understanding and acceptance.  ? ? ? ? ? ?Plan Discussed with: CRNA ? ?Anesthesia Plan Comments: (LMA/GETA backup discussed.   Patient consented for risks of anesthesia including but not limited to:  ?- adverse reactions to medications ?- damage to eyes, teeth, lips or other oral mucosa ?- nerve damage due to positioning  ?- sore throat or hoarseness ?- damage to heart, brain, nerves, lungs, other parts of body or loss of life ? ?Informed patient about role of CRNA in peri- and intra-operative care.  Patient voiced understanding.)  ? ? ? ? ? ? ?Anesthesia Quick Evaluation ? ?

## 2021-08-01 NOTE — Interval H&P Note (Signed)
History and Physical Interval Note: ? ?08/01/2021 ?7:10 AM ? ?Todd Craig  has presented today for surgery, with the diagnosis of Elevated PSA.  The various methods of treatment have been discussed with the patient and family. After consideration of risks, benefits and other options for treatment, the patient has consented to  Procedure(s): ?TRANSRECTAL ULTRASOUND (N/A) ?PROSTATE BIOPSY (N/A) as a surgical intervention.  The patient's history has been reviewed, patient examined, no change in status, stable for surgery.  I have reviewed the patient's chart and labs.  Questions were answered to the patient's satisfaction.   ? ? ?Makynlie Rossini C Aneita Kiger ? ? ?

## 2021-08-01 NOTE — Op Note (Signed)
Preoperative diagnosis:  ?Elevated PSA ? ?Postoperative diagnosis:  ?Same ? ?Procedure: ?Transrectal ultrasound prostate ?Transrectal prostate biopsies ? ?Surgeon: Abbie Sons, MD ? ?Anesthesia: MAC ? ?Complications: None ? ?Intraoperative findings:  ?Prostate volume 52 cc ?No echogenic abnormalities of the peripheral zone ? ?EBL: Minimal ? ?Specimens: Standard 12 core template biopsies + digital biopsy x1 ? ?Indication: Todd Craig is a 61 y.o. male with a rising PSA with last level 12.4.  He was unable to tolerate in office digital rectal exam and would not be able to tolerate transrectal office biopsy.  After reviewing the management options for treatment, he elected to proceed with the above surgical procedure(s). We have discussed the potential benefits and risks of the procedure, side effects of the proposed treatment, the likelihood of the patient achieving the goals of the procedure, and any potential problems that might occur during the procedure or recuperation. Informed consent has been obtained. ? ?Description of procedure: ? ?The patient was taken to the operating room.  He was not transferred to the operating table and remained on the stretcher.  He was placed in the left lateral decubitus position and IV sedation was obtained by anesthesia.  Preoperative antibiotics were administered. A preoperative time-out was performed.  ? ?Digital rectal examination was performed prostate volume estimated at 45 g.  There was firmness of the left lateral aspect of the prostate. ? ?I transrectal ultrasound probe was lubricated and gently inserted per rectum.  Imaging was obtained with findings as described above. ? ?Standard 12 core biopsies were then performed under ultrasound guidance.  Minimal bleeding was noted.  A digital biopsy was then obtained from the left lateral prostate and sent with the left lateral apical core. ? ?He tolerated the procedure well without complications and was transported to the PACU  in stable condition. ? ?Plan: ?He will be scheduled for an appointment for prostate biopsy results ? ? ?Abbie Sons, M.D. ? ?

## 2021-08-02 LAB — SURGICAL PATHOLOGY

## 2021-08-04 ENCOUNTER — Telehealth: Payer: Self-pay | Admitting: Urology

## 2021-08-04 DIAGNOSIS — C61 Malignant neoplasm of prostate: Secondary | ICD-10-CM

## 2021-08-04 NOTE — Telephone Encounter (Signed)
I contacted [name] to discuss his prostate biopsy results.  I spoke with his fianc?e, Daphane Shepherd who is on his DPR and Mr. Danzer was also listening.  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  1 core from the right prostate showed a focus of Gleason 3+3. ? ?The pathology report was discussed including risk stratification.  I recommended scheduling a PSMA/PET.  If this is not covered by his insurance we will schedule CT/bone scan.  He has a follow-up scheduled on 5/3.  They were instructed to call if they had any additional questions. ?

## 2021-08-07 ENCOUNTER — Other Ambulatory Visit: Payer: Self-pay | Admitting: Urology

## 2021-08-07 DIAGNOSIS — C61 Malignant neoplasm of prostate: Secondary | ICD-10-CM

## 2021-08-07 NOTE — Progress Notes (Unsigned)
I have placed orders for the CT and bone scan.  ?

## 2021-08-10 ENCOUNTER — Ambulatory Visit
Admission: RE | Admit: 2021-08-10 | Discharge: 2021-08-10 | Disposition: A | Discharge status: Home / Self Care | Payer: Medicaid Other | Source: Ambulatory Visit | Attending: Urology | Admitting: Urology

## 2021-08-10 DIAGNOSIS — K4090 Unilateral inguinal hernia, without obstruction or gangrene, not specified as recurrent: Secondary | ICD-10-CM | POA: Diagnosis not present

## 2021-08-10 DIAGNOSIS — K5730 Diverticulosis of large intestine without perforation or abscess without bleeding: Secondary | ICD-10-CM | POA: Diagnosis not present

## 2021-08-10 DIAGNOSIS — C61 Malignant neoplasm of prostate: Secondary | ICD-10-CM | POA: Insufficient documentation

## 2021-08-10 DIAGNOSIS — N200 Calculus of kidney: Secondary | ICD-10-CM | POA: Diagnosis not present

## 2021-08-10 LAB — POCT I-STAT CREATININE: Creatinine, Ser: 1.6 mg/dL — ABNORMAL HIGH (ref 0.61–1.24)

## 2021-08-10 MED ORDER — IOHEXOL 300 MG/ML  SOLN: 100.0000 mL | Freq: Once | INTRAMUSCULAR | Status: DC | PRN

## 2021-08-10 MED ORDER — IOHEXOL 300 MG/ML  SOLN
80.0000 mL | Freq: Once | INTRAMUSCULAR | Status: AC | PRN
  Administered 2021-08-10: 80 mL via INTRAVENOUS

## 2021-08-14 ENCOUNTER — Ambulatory Visit: Payer: Medicaid Other

## 2021-08-14 ENCOUNTER — Other Ambulatory Visit: Payer: Medicaid Other

## 2021-08-15 ENCOUNTER — Other Ambulatory Visit: Payer: Medicaid Other

## 2021-08-15 ENCOUNTER — Ambulatory Visit
Admission: RE | Admit: 2021-08-15 | Discharge: 2021-08-15 | Disposition: A | Discharge status: Home / Self Care | Payer: Medicaid Other | Source: Ambulatory Visit | Attending: Urology | Admitting: Urology

## 2021-08-15 ENCOUNTER — Encounter
Admission: RE | Admit: 2021-08-15 | Discharge: 2021-08-15 | Disposition: A | Discharge status: Home / Self Care | Payer: Medicaid Other | Source: Ambulatory Visit | Attending: Urology | Admitting: Urology

## 2021-08-15 DIAGNOSIS — C61 Malignant neoplasm of prostate: Secondary | ICD-10-CM | POA: Diagnosis not present

## 2021-08-15 DIAGNOSIS — Z8546 Personal history of malignant neoplasm of prostate: Secondary | ICD-10-CM | POA: Diagnosis not present

## 2021-08-15 MED ORDER — TECHNETIUM TC 99M MEDRONATE IV KIT
20.0000 | PACK | Freq: Once | INTRAVENOUS | Status: AC | PRN
  Administered 2021-08-15: 19.59 via INTRAVENOUS

## 2021-08-16 ENCOUNTER — Ambulatory Visit (INDEPENDENT_AMBULATORY_CARE_PROVIDER_SITE_OTHER): Payer: Medicaid Other | Admitting: Urology

## 2021-08-16 ENCOUNTER — Encounter: Payer: Self-pay | Admitting: Urology

## 2021-08-16 VITALS — BP 152/82 | HR 78 | Ht 74.0 in | Wt 235.0 lb

## 2021-08-16 DIAGNOSIS — C61 Malignant neoplasm of prostate: Secondary | ICD-10-CM | POA: Diagnosis not present

## 2021-08-17 ENCOUNTER — Encounter: Payer: Self-pay | Admitting: Urology

## 2021-08-17 NOTE — Progress Notes (Signed)
? ?08/16/2021 ?7:32 AM  ? ?Syair Fricker ?[birth year] ?[id number] ? ?Referring provider: Venita Lick, NP ?[address] [place],  [place] [address] ? ?Chief Complaint  ?Patient presents with  ? Prostate Cancer  ? ? ?HPI: ?61 y.o. male presents for prostate cancer follow-up.  His fianc?e and son were present. ? ?Prostate biopsy performed under sedation 08/01/2021; PSA 12.4; firm left lateral aspect DRE ?Prostate volume calculated 52 cc ?6/6 cores positive Gleason 3+3/3+4 on left ?PSMA scan ordered but was not approved by insurance ?CT/bone scan showed no evidence of metastatic disease ? ? ? ?PMH: ?Past Medical History:  ?Diagnosis Date  ? GERD (gastroesophageal reflux disease)   ? Hypertension   ? Hypertensive crisis, unspecified 01/11/2020  ? Stroke [facility]) 2014  ? ? ?Surgical History: ?Past Surgical History:  ?Procedure Laterality Date  ? PROSTATE BIOPSY N/A 08/01/2021  ? Procedure: PROSTATE BIOPSY;  Surgeon: Abbie Sons, MD;  Location: [facility] ORS;  Service: Urology;  Laterality: N/A;  ? right hand repair  1985  ? trauma with saw laceration   ? TRANSRECTAL ULTRASOUND N/A 08/01/2021  ? Procedure: TRANSRECTAL ULTRASOUND;  Surgeon: Abbie Sons, MD;  Location: [facility] ORS;  Service: Urology;  Laterality: N/A;  ? ? ?Home Medications:  ?Allergies as of 08/16/2021   ? ?   Reactions  ? Tramadol   ? Other reaction(s): Other  ? ?  ? ?  ?Medication List  ?  ? ?  ? Accurate as of Aug 16, 2021 11:59 PM. If you have any questions, ask your nurse or doctor.  ?  ?  ? ?  ? ?amLODipine 10 MG tablet ?Commonly known as: NORVASC ?Take 1 tablet (10 mg total) by mouth daily. ?  ?colchicine 0.6 MG tablet ?Take 1 tablet (0.6 mg total) by mouth daily for 5 doses. ?  ?losartan 100 MG tablet ?Commonly known as: COZAAR ?Take 100 mg by mouth daily. ?  ?rosuvastatin 20 MG tablet ?Commonly known as: Crestor ?Take 1 tablet (20 mg total) by mouth daily. ?  ? ?  ? ? ?Allergies:  ?Allergies  ?Allergen Reactions  ? Tramadol   ?  Other reaction(s): Other   ? ? ?Family History: ?History reviewed. No pertinent family history. ? ?Social History:  reports that he has been smoking cigarettes. He has been smoking an average of .5 packs per day. He has never used smokeless tobacco. He reports current alcohol use of about 5.0 standard drinks per week. He reports that he does not use drugs. ? ? ?Physical Exam: ?BP (!) 152/82   Pulse 78   Ht '6\' 2"'$  (1.88 m)   Wt 235 lb (106.6 kg)   BMI 30.17 kg/m?   ?Constitutional:  Alert and oriented, No acute distress. ?Psychiatric: Normal mood and affect. ? ? ? ?Assessment & Plan:   ? ?1.  Prostate cancer ?T2 intermediate (unfavorable) risk prostate cancer ?Pathology and staging results were discussed ?The patient was counseled about the natural history of prostate cancer and the standard treatment options that are available for prostate cancer. It was explained to him how his age and life expectancy, clinical stage, Gleason score, and PSA affect his prognosis, the decision to proceed with additional staging studies, as well as how that information influences recommended treatment strategies. We discussed the roles for active surveillance, radiation therapy, surgical therapy, androgen deprivation, as well as ablative therapy options for the treatment of prostate cancer as appropriate to his individual cancer situation. We discussed the risks and benefits  of these options with regard to their impact on cancer control and also in terms of potential adverse events, complications, and impact on quality of life particularly related to urinary, bowel, and sexual function. The patient was encouraged to ask questions throughout the discussion today and all questions were answered to his stated satisfaction. In addition, the patient was provided with and/or directed to appropriate resources and literature for further education about prostate cancer treatment options. ?Radical prostatectomy and radiation modalities were discussed including  complications/side effects ?He was informed there is not considered a "best" treatment for prostate cancer and both of these treatments are considered effective.  We did discuss there is a higher chance of recurrence with radiation after 10-15 years and that radical prostatectomy may be a better option for younger patients ?He wanted to think over these options.  His fianc?e did request a second opinion at [facility] and referral for a multidisciplinary oncology appointment with urology/radiation oncology was placed ? ?I spent 30 total minutes on the day of the encounter including pre-visit review of the medical record, face-to-face time with the patient, and post visit ordering of labs/imaging/tests. ? ? ? ?Abbie Sons, MD ? ?[facility] ?[address] ?[place], [place] [address] ?[phone] ? ?

## 2021-08-28 DIAGNOSIS — C61 Malignant neoplasm of prostate: Secondary | ICD-10-CM | POA: Diagnosis not present

## 2021-08-28 DIAGNOSIS — K4090 Unilateral inguinal hernia, without obstruction or gangrene, not specified as recurrent: Secondary | ICD-10-CM | POA: Diagnosis not present

## 2021-08-28 DIAGNOSIS — Z8546 Personal history of malignant neoplasm of prostate: Secondary | ICD-10-CM | POA: Diagnosis not present

## 2021-09-04 DIAGNOSIS — R3915 Urgency of urination: Secondary | ICD-10-CM | POA: Diagnosis not present

## 2021-09-04 DIAGNOSIS — C61 Malignant neoplasm of prostate: Secondary | ICD-10-CM | POA: Diagnosis not present

## 2021-09-04 DIAGNOSIS — R350 Frequency of micturition: Secondary | ICD-10-CM | POA: Diagnosis not present

## 2021-09-04 DIAGNOSIS — Z6829 Body mass index (BMI) 29.0-29.9, adult: Secondary | ICD-10-CM | POA: Diagnosis not present

## 2021-09-06 DIAGNOSIS — H9121 Sudden idiopathic hearing loss, right ear: Secondary | ICD-10-CM | POA: Diagnosis not present

## 2021-09-06 DIAGNOSIS — H90A21 Sensorineural hearing loss, unilateral, right ear, with restricted hearing on the contralateral side: Secondary | ICD-10-CM | POA: Diagnosis not present

## 2021-09-07 ENCOUNTER — Other Ambulatory Visit: Payer: Self-pay | Admitting: Otolaryngology

## 2021-09-07 DIAGNOSIS — R42 Dizziness and giddiness: Secondary | ICD-10-CM

## 2021-09-07 DIAGNOSIS — H905 Unspecified sensorineural hearing loss: Secondary | ICD-10-CM

## 2021-09-14 ENCOUNTER — Other Ambulatory Visit: Payer: Self-pay

## 2021-09-14 ENCOUNTER — Encounter
Admission: EM | Disposition: A | Discharge status: Home / Self Care | Payer: Self-pay | Source: Home / Self Care | Attending: Emergency Medicine

## 2021-09-14 ENCOUNTER — Emergency Department
Admission: EM | Admit: 2021-09-14 | Discharge: 2021-09-14 | Disposition: A | Discharge status: Home / Self Care | Payer: Medicaid Other | Attending: Emergency Medicine | Admitting: Emergency Medicine

## 2021-09-14 DIAGNOSIS — R42 Dizziness and giddiness: Secondary | ICD-10-CM | POA: Diagnosis not present

## 2021-09-14 DIAGNOSIS — N189 Chronic kidney disease, unspecified: Secondary | ICD-10-CM | POA: Insufficient documentation

## 2021-09-14 DIAGNOSIS — I213 ST elevation (STEMI) myocardial infarction of unspecified site: Secondary | ICD-10-CM | POA: Diagnosis not present

## 2021-09-14 DIAGNOSIS — R61 Generalized hyperhidrosis: Secondary | ICD-10-CM | POA: Diagnosis not present

## 2021-09-14 DIAGNOSIS — I129 Hypertensive chronic kidney disease with stage 1 through stage 4 chronic kidney disease, or unspecified chronic kidney disease: Secondary | ICD-10-CM | POA: Insufficient documentation

## 2021-09-14 DIAGNOSIS — R000 Tachycardia, unspecified: Secondary | ICD-10-CM | POA: Diagnosis not present

## 2021-09-14 DIAGNOSIS — R55 Syncope and collapse: Secondary | ICD-10-CM | POA: Insufficient documentation

## 2021-09-14 LAB — CBC
HCT: 47 % (ref 39.0–52.0)
Hemoglobin: 15.4 g/dL (ref 13.0–17.0)
MCH: 33.3 pg (ref 26.0–34.0)
MCHC: 32.8 g/dL (ref 30.0–36.0)
MCV: 101.7 fL — ABNORMAL HIGH (ref 80.0–100.0)
Platelets: 206 10*3/uL (ref 150–400)
RBC: 4.62 MIL/uL (ref 4.22–5.81)
RDW: 12.5 % (ref 11.5–15.5)
WBC: 13.9 10*3/uL — ABNORMAL HIGH (ref 4.0–10.5)
nRBC: 0 % (ref 0.0–0.2)

## 2021-09-14 LAB — TROPONIN I (HIGH SENSITIVITY): Troponin I (High Sensitivity): 11 ng/L (ref <18)

## 2021-09-14 LAB — COMPREHENSIVE METABOLIC PANEL
ALT: 22 U/L (ref 0–44)
AST: 19 U/L (ref 15–41)
Albumin: 3.8 g/dL (ref 3.5–5.0)
Alkaline Phosphatase: 72 U/L (ref 38–126)
Anion gap: 5 (ref 5–15)
BUN: 21 mg/dL — ABNORMAL HIGH (ref 6–20)
CO2: 28 mmol/L (ref 22–32)
Calcium: 11.2 mg/dL — ABNORMAL HIGH (ref 8.9–10.3)
Chloride: 109 mmol/L (ref 98–111)
Creatinine, Ser: 1.59 mg/dL — ABNORMAL HIGH (ref 0.61–1.24)
GFR, Estimated: 50 mL/min — ABNORMAL LOW (ref >60)
Glucose, Bld: 124 mg/dL — ABNORMAL HIGH (ref 70–99)
Potassium: 3.3 mmol/L — ABNORMAL LOW (ref 3.5–5.1)
Sodium: 142 mmol/L (ref 135–145)
Total Bilirubin: 1.1 mg/dL (ref 0.3–1.2)
Total Protein: 7.1 g/dL (ref 6.5–8.1)

## 2021-09-14 SURGERY — CORONARY/GRAFT ACUTE MI REVASCULARIZATION
Anesthesia: Moderate Sedation

## 2021-09-14 MED ORDER — SODIUM CHLORIDE 0.9 % IV SOLN
1000.0000 mL | Freq: Once | INTRAVENOUS | Status: AC
  Administered 2021-09-14: 1000 mL via INTRAVENOUS

## 2021-09-14 NOTE — ED Notes (Signed)
Called pt's significant other to inform of discharge

## 2021-09-14 NOTE — ED Notes (Signed)
Code STEMI cancelled per ED provider.

## 2021-09-14 NOTE — ED Provider Notes (Signed)
[facility] Provider Note    Event Date/Time   First MD Initiated Contact with Patient 09/14/21 1224     (approximate)   History   Abnormal ECG   HPI  Todd Craig is a 61 y.o. male with a history of hypertension, chronic kidney disease, elevated PSA with recent prostate biopsy who presents after syncopal episode.  Patient reports he felt lightheaded and had to sit down, EMS reports that he was initially sweaty but is improved now.  Patient denies chest pain.  No palpitations.  No nausea vomiting or diarrhea.  No fevers chills or cough     Physical Exam   Triage Vital Signs: ED Triage Vitals  Enc Vitals Group     BP 09/14/21 1227 130/83     Pulse Rate 09/14/21 1227 70     Resp 09/14/21 1227 (!) 21     Temp 09/14/21 1227 97.8 F (36.6 C)     Temp Source 09/14/21 1227 Oral     SpO2 09/14/21 1227 98 %     Weight 09/14/21 1229 106.6 kg (235 lb)     Height 09/14/21 1229 1.88 m ('6\' 2"'$ )     Head Circumference --      Peak Flow --      Pain Score 09/14/21 1229 0     Pain Loc --      Pain Edu? --      Excl. in [place]? --     Most recent vital signs: Vitals:   09/14/21 1300 09/14/21 1400  BP: 122/78 139/84  Pulse: 68 65  Resp: 17 (!) 23  Temp:    SpO2: 96% 100%     General: Awake, no distress.  CV:  Good peripheral perfusion.  Resp:  Normal effort.  Abd:  No distention.  Other:     ED Results / Procedures / Treatments   Labs (all labs ordered are listed, but only abnormal results are displayed) Labs Reviewed  CBC - Abnormal; Notable for the following components:      Result Value   WBC 13.9 (*)    MCV 101.7 (*)    All other components within normal limits  COMPREHENSIVE METABOLIC PANEL - Abnormal; Notable for the following components:   Potassium 3.3 (*)    Glucose, Bld 124 (*)    BUN 21 (*)    Creatinine, Ser 1.59 (*)    Calcium 11.2 (*)    GFR, Estimated 50 (*)    All other components within normal limits  TROPONIN I (HIGH  SENSITIVITY)     EKG  ED ECG REPORT I, Lavonia Drafts, the attending physician, personally viewed and interpreted this ECG.  Date: 09/14/2021  Rhythm: normal sinus rhythm QRS Axis: normal Intervals: normal ST/T Wave abnormalities: Mild elevations in V2 V3, chronic Narrative Interpretation: no evidence of acute ischemia    RADIOLOGY     PROCEDURES:  Critical Care performed:   Procedures   MEDICATIONS ORDERED IN ED: Medications  0.9 %  sodium chloride infusion (1,000 mLs Intravenous New Bag/Given 09/14/21 1332)     IMPRESSION / MDM / ASSESSMENT AND PLAN / ED COURSE  I reviewed the triage vital signs and the nursing notes. Patient's presentation is most consistent with acute presentation with potential threat to life or bodily function.   Patient presents after a syncopal episode as detailed above.  EMS brought the patient as a STEMI however I canceled the STEMI call given EKG changes appear chronic  Differential includes vasovagal,  ACS, electrolyte abnormalities  Lab work is overall reassuring, high sensitive troponin is normal  Chronic elevation of calcium  Mild elevation of BUN/creatinine ratio consistent with dehydration, patient is received IV fluids  ----------------------------------------- 2:34 PM on 09/14/2021 ----------------------------------------- Reevaluated patient, he is feeling well and has no complaints.  Considered admission however the patient would prefer discharge with a think is reasonable, outpatient follow-up recommended, return precautions discussed.       FINAL CLINICAL IMPRESSION(S) / ED DIAGNOSES   Final diagnoses:  Syncope, unspecified syncope type     Rx / DC Orders   ED Discharge Orders     None        Note:  This document was prepared using Dragon voice recognition software and may include unintentional dictation errors.   Lavonia Drafts, MD 09/14/21 1436

## 2021-09-14 NOTE — ED Notes (Signed)
Called [facility] to cancel [facility] per Dr. Corky Downs @ [phone]

## 2021-09-14 NOTE — ED Triage Notes (Signed)
Pt via EMS after syncopal episode w/loss of consciousness after leaving a barber shop, similar hx 3 weeks ago. EKG en route showed multiple leads with elevation, code STEMI activated PTA. Pt arrives in NAD, no SOB or chest pain. Diaphoresis reported en route, resolved on arrival. '324mg'$  aspirin administered by EMS. Pt on room air with vitals WNL.

## 2021-09-15 ENCOUNTER — Telehealth: Payer: Self-pay

## 2021-09-15 NOTE — Telephone Encounter (Signed)
Transition Care Management Unsuccessful Follow-up Telephone Call  Date of discharge and from where:  09/14/2021-[facility]  Attempts:  1st Attempt  Reason for unsuccessful TCM follow-up call:  Left voice message

## 2021-09-18 DIAGNOSIS — Z8546 Personal history of malignant neoplasm of prostate: Secondary | ICD-10-CM | POA: Insufficient documentation

## 2021-09-18 DIAGNOSIS — C61 Malignant neoplasm of prostate: Secondary | ICD-10-CM | POA: Insufficient documentation

## 2021-09-18 NOTE — Telephone Encounter (Signed)
Transition Care Management Unsuccessful Follow-up Telephone Call  Date of discharge and from where:  09/14/2021 from [facility]  Attempts:  2nd Attempt  Reason for unsuccessful TCM follow-up call:  Unable to leave message

## 2021-09-19 NOTE — Telephone Encounter (Signed)
Transition Care Management Unsuccessful Follow-up Telephone Call  Date of discharge and from where:  09/14/2021 from [facility]  Attempts:  3rd Attempt  Reason for unsuccessful TCM follow-up call:  Unable to leave message

## 2021-09-21 ENCOUNTER — Ambulatory Visit
Admission: RE | Admit: 2021-09-21 | Discharge: 2021-09-21 | Disposition: A | Discharge status: Home / Self Care | Payer: Medicaid Other | Source: Ambulatory Visit | Attending: Otolaryngology | Admitting: Otolaryngology

## 2021-09-21 DIAGNOSIS — H9041 Sensorineural hearing loss, unilateral, right ear, with unrestricted hearing on the contralateral side: Secondary | ICD-10-CM | POA: Diagnosis not present

## 2021-09-21 DIAGNOSIS — R42 Dizziness and giddiness: Secondary | ICD-10-CM

## 2021-09-21 DIAGNOSIS — H905 Unspecified sensorineural hearing loss: Secondary | ICD-10-CM

## 2021-09-21 DIAGNOSIS — I69314 Frontal lobe and executive function deficit following cerebral infarction: Secondary | ICD-10-CM | POA: Diagnosis not present

## 2021-09-21 MED ORDER — GADOBENATE DIMEGLUMINE 529 MG/ML IV SOLN
20.0000 mL | Freq: Once | INTRAVENOUS | Status: AC | PRN
  Administered 2021-09-21: 20 mL via INTRAVENOUS

## 2021-10-24 ENCOUNTER — Encounter: Payer: Self-pay | Admitting: Neurology

## 2021-10-24 ENCOUNTER — Ambulatory Visit: Payer: Medicaid Other | Admitting: Neurology

## 2021-10-24 VITALS — BP 125/83 | HR 77 | Ht 74.5 in | Wt 224.5 lb

## 2021-10-24 DIAGNOSIS — E785 Hyperlipidemia, unspecified: Secondary | ICD-10-CM | POA: Diagnosis not present

## 2021-10-24 DIAGNOSIS — I10 Essential (primary) hypertension: Secondary | ICD-10-CM

## 2021-10-24 DIAGNOSIS — R55 Syncope and collapse: Secondary | ICD-10-CM | POA: Diagnosis not present

## 2021-10-24 DIAGNOSIS — I6381 Other cerebral infarction due to occlusion or stenosis of small artery: Secondary | ICD-10-CM

## 2021-10-24 DIAGNOSIS — D329 Benign neoplasm of meninges, unspecified: Secondary | ICD-10-CM

## 2021-10-24 DIAGNOSIS — R4182 Altered mental status, unspecified: Secondary | ICD-10-CM

## 2021-10-24 NOTE — Patient Instructions (Addendum)
Start with Aspirin 81 mg daily  Strict blood pressure control, goal is less than 130 Continue with Crestor, LDL goal less than 70, recheck LDL in 6 months  Return in 6 months

## 2021-10-24 NOTE — Progress Notes (Signed)
[facility]  PATIENT: Todd Craig DOB: Jun 15, 1960  REQUESTING CLINICIAN: Beverly Gust, MD HISTORY FROM: Patient and fiancee  REASON FOR VISIT: Abnormal MRI    HISTORICAL  CHIEF COMPLAINT:  Chief Complaint  Patient presents with   New Patient (Initial Visit)    Rm 12. Accompanied by fiance. NP/[facility] Tami Ribas MD/hearing loss, veritgo, abnormal MRI with acute stroke, multiple chronic strokes.    HISTORY OF PRESENT ILLNESS:  This is a 61 year old gentleman past medical history of hypertension, hyperlipidemia, stroke in 2012 when he presented for right arm weakness who is presenting with abnormal MRI.  Patient reports experiencing hearing loss in his right ear, he did follow-up with audiology and ENT and had an MRI which showed an acute right frontal stroke but multiple lacunar strokes.  Due to those abnormalities, he was referred to neurology.  At the moment he does not report any focal neurological deficit, denies any abnormalities and has no complaints.  Ellene Route has reported 2 episodes of syncope, the last one being on June 1 while he was in the barbershop.  After each episode patient is confused and profusely sweating.  There are no report of abnormal movement    OTHER MEDICAL CONDITIONS: Hearing loss, hypertension    REVIEW OF SYSTEMS: Full 14 system review of systems performed and negative with exception of: as noted in the HPI   ALLERGIES: Allergies  Allergen Reactions   Tramadol     Other reaction(s): Other    HOME MEDICATIONS: Outpatient Medications Prior to Visit  Medication Sig Dispense Refill   amLODipine (NORVASC) 10 MG tablet Take 1 tablet (10 mg total) by mouth daily. 90 tablet 4   losartan (COZAAR) 100 MG tablet Take 100 mg by mouth daily.     rosuvastatin (CRESTOR) 20 MG tablet Take 1 tablet (20 mg total) by mouth daily. 90 tablet 4   colchicine 0.6 MG tablet Take 1 tablet (0.6 mg total) by mouth daily for 5 doses. 5  tablet 0   No facility-administered medications prior to visit.    PAST MEDICAL HISTORY: Past Medical History:  Diagnosis Date   GERD (gastroesophageal reflux disease)    Hypertension    Hypertensive crisis, unspecified 01/11/2020   Stroke ([place]) 2014    PAST SURGICAL HISTORY: Past Surgical History:  Procedure Laterality Date   PROSTATE BIOPSY N/A 08/01/2021   Procedure: PROSTATE BIOPSY;  Surgeon: Abbie Sons, MD;  Location: [facility] ORS;  Service: Urology;  Laterality: N/A;   right hand repair  1985   trauma with saw laceration    TRANSRECTAL ULTRASOUND N/A 08/01/2021   Procedure: TRANSRECTAL ULTRASOUND;  Surgeon: Abbie Sons, MD;  Location: [facility] ORS;  Service: Urology;  Laterality: N/A;    FAMILY HISTORY: History reviewed. No pertinent family history.  SOCIAL HISTORY: Social History   Socioeconomic History   Marital status: Single    Spouse name: Not on file   Number of children: Not on file   Years of education: Not on file   Highest education level: Not on file  Occupational History   Not on file  Tobacco Use   Smoking status: Every Day    Packs/day: 0.50    Types: Cigarettes   Smokeless tobacco: Never  Vaping Use   Vaping Use: Never used  Substance and Sexual Activity   Alcohol use: Yes    Alcohol/week: 5.0 standard drinks of alcohol    Types: 5 Cans of beer per week    Comment: on weekends  Drug use: No   Sexual activity: Yes  Other Topics Concern   Not on file  Social History Narrative   Not on file   Social Determinants of Health   Financial Resource Strain: Low Risk  (03/02/2021)   Overall Financial Resource Strain (CARDIA)    Difficulty of Paying Living Expenses: Not very hard  Food Insecurity: No Food Insecurity (03/02/2021)   Hunger Vital Sign    Worried About Running Out of Food in the Last Year: Never true    Ran Out of Food in the Last Year: Never true  Transportation Needs: No Transportation Needs (03/02/2021)   PRAPARE -  Hydrologist (Medical): No    Lack of Transportation (Non-Medical): No  Physical Activity: Sufficiently Active (03/02/2021)   Exercise Vital Sign    Days of Exercise per Week: 4 days    Minutes of Exercise per Session: 40 min  Stress: No Stress Concern Present (03/02/2021)   [place]    Feeling of Stress : Only a little  Social Connections: Moderately Integrated (03/02/2021)   Social Connection and Isolation Panel [NHANES]    Frequency of Communication with Friends and Family: More than three times a week    Frequency of Social Gatherings with Friends and Family: More than three times a week    Attends Religious Services: More than 4 times per year    Active Member of [employer] or Organizations: No    Attends Archivist Meetings: Never    Marital Status: Living with partner  Intimate Partner Violence: Not At Risk (03/02/2021)   Humiliation, Afraid, Rape, and Kick questionnaire    Fear of Current or Ex-Partner: No    Emotionally Abused: No    Physically Abused: No    Sexually Abused: No     PHYSICAL EXAM  GENERAL EXAM/CONSTITUTIONAL: Vitals:  Vitals:   10/24/21 1121  BP: 125/83  Pulse: 77  Weight: 224 lb 8 oz (101.8 kg)  Height: 6' 2.5" (1.892 m)   Body mass index is 28.44 kg/m. Wt Readings from Last 3 Encounters:  10/24/21 224 lb 8 oz (101.8 kg)  09/14/21 235 lb (106.6 kg)  08/16/21 235 lb (106.6 kg)   Patient is in no distress; well developed, nourished and groomed; neck is supple  EYES: Pupils round and reactive to light, Visual fields full to confrontation, Extraocular movements intacts,   MUSCULOSKELETAL: Gait, strength, tone, movements noted in Neurologic exam below  NEUROLOGIC: MENTAL STATUS:      No data to display         awake, alert, oriented to person, place and time recent and remote memory intact normal attention and concentration language  fluent, comprehension intact, naming intact fund of knowledge appropriate  CRANIAL NERVE:  2nd, 3rd, 4th, 6th - pupils equal and reactive to light, visual fields full to confrontation, extraocular muscles intact, no nystagmus 5th - facial sensation symmetric 7th - facial strength symmetric 8th - hearing intact 9th - palate elevates symmetrically, uvula midline 11th - shoulder shrug symmetric 12th - tongue protrusion midline  MOTOR:  normal bulk and tone, full strength in the BUE, BLE  SENSORY:  normal and symmetric to light touch, pinprick, temperature, vibration  COORDINATION:  finger-nose-finger, fine finger movements normal  REFLEXES:  deep tendon reflexes present and symmetric  GAIT/STATION:  normal    DIAGNOSTIC DATA (LABS, IMAGING, TESTING) - I reviewed patient records, labs, notes, testing and imaging myself where  available.  Lab Results  Component Value Date   WBC 13.9 (H) 09/14/2021   HGB 15.4 09/14/2021   HCT 47.0 09/14/2021   MCV 101.7 (H) 09/14/2021   PLT 206 09/14/2021      Component Value Date/Time   NA 142 09/14/2021 1230   NA 141 03/02/2021 1209   K 3.3 (L) 09/14/2021 1230   K 4.0 05/24/2011 0640   CL 109 09/14/2021 1230   CO2 28 09/14/2021 1230   GLUCOSE 124 (H) 09/14/2021 1230   BUN 21 (H) 09/14/2021 1230   BUN 13 03/02/2021 1209   CREATININE 1.59 (H) 09/14/2021 1230   CALCIUM 11.2 (H) 09/14/2021 1230   PROT 7.1 09/14/2021 1230   PROT 7.1 03/02/2021 1209   ALBUMIN 3.8 09/14/2021 1230   ALBUMIN 4.5 03/02/2021 1209   AST 19 09/14/2021 1230   ALT 22 09/14/2021 1230   ALKPHOS 72 09/14/2021 1230   BILITOT 1.1 09/14/2021 1230   BILITOT 0.9 03/02/2021 1209   GFRNONAA 50 (L) 09/14/2021 1230   GFRAA >60 01/11/2020 1300   Lab Results  Component Value Date   CHOL 199 03/02/2021   HDL 43 03/02/2021   LDLCALC 126 (H) 03/02/2021   TRIG 170 (H) 03/02/2021   CHOLHDL 4 02/16/2020   Lab Results  Component Value Date   HGBA1C 4.3 (L)  03/02/2021   No results found for: "VITAMINB12" Lab Results  Component Value Date   TSH 0.911 03/02/2021     MRI Brain 09/22/21 1. 4 mm acute subcortical infarct in the right frontal lobe. 2. Negative internal auditory canal imaging. No retrocochlear lesion. 3. Severe chronic small vessel ischemic disease, progressed from 2012. 4. 5 mm extra-axial mass along the falx consistent with a meningioma. No mass effect or brain edema. 5. Innumerable chronic microhemorrhages compatible with chronic hypertension.    ASSESSMENT AND PLAN  62 y.o. year old male with vascular risk factors including hypertension and hyperlipidemia who is presenting after multiple strokes found on MRI.  Patient strokes are secondary to likely small vessel disease related to uncontrolled hypertension.  At this time he is already on amlodipine and losartan with good blood pressure control.  I informed patient that his goal for systolic blood pressure is to be less than 130.  He is also on Crestor but her LDL is 126. Advised patient to continue current medications and his new LDL goal is less than 70.  He voices understanding.   With the episodes of recurrent syncope, confusion and MRI with multiple strokes, I will obtain an ambulatory EEG to rule out seizures or epileptiform discharges.  They are comfortable with plan.  I will see them in 6 months for follow-up but will contact them to discuss the EEG results. For his meningioma, will consider repeat images in 2 to 3 years.    1. Cerebrovascular accident (CVA) due to occlusion of small artery ([place])   2. Primary hypertension   3. Hyperlipidemia, unspecified hyperlipidemia type   4. Syncope, unspecified syncope type   5. Altered mental status, unspecified altered mental status type      Patient Instructions  Start with Aspirin 81 mg daily  Strict blood pressure control, goal is less than 130 Continue with Crestor, LDL goal less than 70, recheck LDL in 6 months  Return  in 6 months   Orders Placed This Encounter  Procedures   AMBULATORY EEG    No orders of the defined types were placed in this encounter.   Return in about  6 months (around 04/26/2022).  I have spent a total of 65 minutes dedicated to this patient today, preparing to see patient, performing a medically appropriate examination and evaluation, ordering tests and/or medications and procedures, and counseling and educating the patient/family/caregiver; independently interpreting result and communicating results to the family/patient/caregiver; and documenting clinical information in the electronic medical record.   Alric Ran, MD 10/24/2021, 5:02 PM  [facility] [address], [place], [place] [address] [phone]

## 2021-10-31 ENCOUNTER — Ambulatory Visit: Payer: Medicaid Other | Admitting: Nurse Practitioner

## 2021-11-28 DIAGNOSIS — Z79899 Other long term (current) drug therapy: Secondary | ICD-10-CM | POA: Diagnosis not present

## 2021-11-28 DIAGNOSIS — I10 Essential (primary) hypertension: Secondary | ICD-10-CM | POA: Diagnosis not present

## 2021-11-28 DIAGNOSIS — K4090 Unilateral inguinal hernia, without obstruction or gangrene, not specified as recurrent: Secondary | ICD-10-CM | POA: Diagnosis not present

## 2021-11-28 DIAGNOSIS — Z8673 Personal history of transient ischemic attack (TIA), and cerebral infarction without residual deficits: Secondary | ICD-10-CM | POA: Diagnosis not present

## 2021-11-28 DIAGNOSIS — C61 Malignant neoplasm of prostate: Secondary | ICD-10-CM | POA: Diagnosis not present

## 2021-11-29 DIAGNOSIS — C61 Malignant neoplasm of prostate: Secondary | ICD-10-CM | POA: Diagnosis not present

## 2021-11-29 DIAGNOSIS — I10 Essential (primary) hypertension: Secondary | ICD-10-CM | POA: Diagnosis not present

## 2021-11-29 DIAGNOSIS — Z8673 Personal history of transient ischemic attack (TIA), and cerebral infarction without residual deficits: Secondary | ICD-10-CM | POA: Diagnosis not present

## 2021-11-29 DIAGNOSIS — Z79899 Other long term (current) drug therapy: Secondary | ICD-10-CM | POA: Diagnosis not present

## 2021-12-05 DIAGNOSIS — C61 Malignant neoplasm of prostate: Secondary | ICD-10-CM | POA: Diagnosis not present

## 2021-12-06 DIAGNOSIS — F17210 Nicotine dependence, cigarettes, uncomplicated: Secondary | ICD-10-CM | POA: Diagnosis not present

## 2021-12-06 DIAGNOSIS — Z6827 Body mass index (BMI) 27.0-27.9, adult: Secondary | ICD-10-CM | POA: Diagnosis not present

## 2021-12-06 DIAGNOSIS — K567 Ileus, unspecified: Secondary | ICD-10-CM | POA: Diagnosis not present

## 2021-12-06 DIAGNOSIS — R109 Unspecified abdominal pain: Secondary | ICD-10-CM | POA: Diagnosis not present

## 2021-12-06 DIAGNOSIS — K219 Gastro-esophageal reflux disease without esophagitis: Secondary | ICD-10-CM | POA: Diagnosis not present

## 2021-12-06 DIAGNOSIS — Z885 Allergy status to narcotic agent status: Secondary | ICD-10-CM | POA: Diagnosis not present

## 2021-12-06 DIAGNOSIS — A419 Sepsis, unspecified organism: Secondary | ICD-10-CM | POA: Diagnosis not present

## 2021-12-06 DIAGNOSIS — N189 Chronic kidney disease, unspecified: Secondary | ICD-10-CM | POA: Diagnosis not present

## 2021-12-06 DIAGNOSIS — N179 Acute kidney failure, unspecified: Secondary | ICD-10-CM | POA: Diagnosis not present

## 2021-12-06 DIAGNOSIS — R7881 Bacteremia: Secondary | ICD-10-CM | POA: Diagnosis not present

## 2021-12-06 DIAGNOSIS — E46 Unspecified protein-calorie malnutrition: Secondary | ICD-10-CM | POA: Diagnosis not present

## 2021-12-06 DIAGNOSIS — E870 Hyperosmolality and hypernatremia: Secondary | ICD-10-CM | POA: Diagnosis not present

## 2021-12-06 DIAGNOSIS — I10 Essential (primary) hypertension: Secondary | ICD-10-CM | POA: Diagnosis not present

## 2021-12-06 DIAGNOSIS — R0689 Other abnormalities of breathing: Secondary | ICD-10-CM | POA: Diagnosis not present

## 2021-12-06 DIAGNOSIS — I129 Hypertensive chronic kidney disease with stage 1 through stage 4 chronic kidney disease, or unspecified chronic kidney disease: Secondary | ICD-10-CM | POA: Diagnosis not present

## 2021-12-06 DIAGNOSIS — B961 Klebsiella pneumoniae [K. pneumoniae] as the cause of diseases classified elsewhere: Secondary | ICD-10-CM | POA: Diagnosis not present

## 2021-12-06 DIAGNOSIS — B9562 Methicillin resistant Staphylococcus aureus infection as the cause of diseases classified elsewhere: Secondary | ICD-10-CM | POA: Diagnosis not present

## 2021-12-06 DIAGNOSIS — C61 Malignant neoplasm of prostate: Secondary | ICD-10-CM | POA: Diagnosis not present

## 2021-12-06 DIAGNOSIS — Z79899 Other long term (current) drug therapy: Secondary | ICD-10-CM | POA: Diagnosis not present

## 2021-12-06 DIAGNOSIS — Z9079 Acquired absence of other genital organ(s): Secondary | ICD-10-CM | POA: Diagnosis not present

## 2021-12-06 DIAGNOSIS — R1084 Generalized abdominal pain: Secondary | ICD-10-CM | POA: Diagnosis not present

## 2021-12-06 DIAGNOSIS — T8140XA Infection following a procedure, unspecified, initial encounter: Secondary | ICD-10-CM | POA: Diagnosis not present

## 2021-12-06 DIAGNOSIS — R8281 Pyuria: Secondary | ICD-10-CM | POA: Diagnosis not present

## 2021-12-06 DIAGNOSIS — R509 Fever, unspecified: Secondary | ICD-10-CM | POA: Diagnosis not present

## 2021-12-06 DIAGNOSIS — Z6829 Body mass index (BMI) 29.0-29.9, adult: Secondary | ICD-10-CM | POA: Diagnosis not present

## 2021-12-06 DIAGNOSIS — R000 Tachycardia, unspecified: Secondary | ICD-10-CM | POA: Diagnosis not present

## 2021-12-06 DIAGNOSIS — M109 Gout, unspecified: Secondary | ICD-10-CM | POA: Diagnosis not present

## 2021-12-06 DIAGNOSIS — E785 Hyperlipidemia, unspecified: Secondary | ICD-10-CM | POA: Diagnosis not present

## 2021-12-06 DIAGNOSIS — Z20822 Contact with and (suspected) exposure to covid-19: Secondary | ICD-10-CM | POA: Diagnosis not present

## 2021-12-07 DIAGNOSIS — Z4682 Encounter for fitting and adjustment of non-vascular catheter: Secondary | ICD-10-CM | POA: Diagnosis not present

## 2021-12-07 DIAGNOSIS — N179 Acute kidney failure, unspecified: Secondary | ICD-10-CM | POA: Diagnosis not present

## 2021-12-07 DIAGNOSIS — R109 Unspecified abdominal pain: Secondary | ICD-10-CM | POA: Diagnosis not present

## 2021-12-07 DIAGNOSIS — A419 Sepsis, unspecified organism: Secondary | ICD-10-CM | POA: Diagnosis not present

## 2021-12-07 DIAGNOSIS — Z452 Encounter for adjustment and management of vascular access device: Secondary | ICD-10-CM | POA: Diagnosis not present

## 2021-12-07 DIAGNOSIS — R1084 Generalized abdominal pain: Secondary | ICD-10-CM | POA: Diagnosis not present

## 2021-12-07 DIAGNOSIS — Z6827 Body mass index (BMI) 27.0-27.9, adult: Secondary | ICD-10-CM | POA: Diagnosis not present

## 2021-12-07 DIAGNOSIS — Z20822 Contact with and (suspected) exposure to covid-19: Secondary | ICD-10-CM | POA: Diagnosis not present

## 2021-12-07 DIAGNOSIS — R140 Abdominal distension (gaseous): Secondary | ICD-10-CM | POA: Diagnosis not present

## 2021-12-07 DIAGNOSIS — R112 Nausea with vomiting, unspecified: Secondary | ICD-10-CM | POA: Diagnosis not present

## 2021-12-08 DIAGNOSIS — B961 Klebsiella pneumoniae [K. pneumoniae] as the cause of diseases classified elsewhere: Secondary | ICD-10-CM | POA: Diagnosis not present

## 2021-12-08 DIAGNOSIS — B9561 Methicillin susceptible Staphylococcus aureus infection as the cause of diseases classified elsewhere: Secondary | ICD-10-CM | POA: Diagnosis not present

## 2021-12-08 DIAGNOSIS — R6520 Severe sepsis without septic shock: Secondary | ICD-10-CM | POA: Diagnosis not present

## 2021-12-08 DIAGNOSIS — R7881 Bacteremia: Secondary | ICD-10-CM | POA: Diagnosis not present

## 2021-12-09 DIAGNOSIS — R4182 Altered mental status, unspecified: Secondary | ICD-10-CM | POA: Diagnosis not present

## 2021-12-09 DIAGNOSIS — R109 Unspecified abdominal pain: Secondary | ICD-10-CM | POA: Diagnosis not present

## 2021-12-09 DIAGNOSIS — B961 Klebsiella pneumoniae [K. pneumoniae] as the cause of diseases classified elsewhere: Secondary | ICD-10-CM | POA: Diagnosis not present

## 2021-12-09 DIAGNOSIS — B9561 Methicillin susceptible Staphylococcus aureus infection as the cause of diseases classified elsewhere: Secondary | ICD-10-CM | POA: Diagnosis not present

## 2021-12-09 DIAGNOSIS — Z4682 Encounter for fitting and adjustment of non-vascular catheter: Secondary | ICD-10-CM | POA: Diagnosis not present

## 2021-12-09 DIAGNOSIS — R7881 Bacteremia: Secondary | ICD-10-CM | POA: Diagnosis not present

## 2021-12-09 DIAGNOSIS — J9811 Atelectasis: Secondary | ICD-10-CM | POA: Diagnosis not present

## 2021-12-10 DIAGNOSIS — I5189 Other ill-defined heart diseases: Secondary | ICD-10-CM | POA: Diagnosis not present

## 2021-12-10 DIAGNOSIS — R7881 Bacteremia: Secondary | ICD-10-CM | POA: Diagnosis not present

## 2021-12-11 DIAGNOSIS — A419 Sepsis, unspecified organism: Secondary | ICD-10-CM | POA: Diagnosis not present

## 2021-12-11 DIAGNOSIS — B958 Unspecified staphylococcus as the cause of diseases classified elsewhere: Secondary | ICD-10-CM | POA: Diagnosis not present

## 2021-12-11 DIAGNOSIS — J9811 Atelectasis: Secondary | ICD-10-CM | POA: Diagnosis not present

## 2021-12-11 DIAGNOSIS — Z452 Encounter for adjustment and management of vascular access device: Secondary | ICD-10-CM | POA: Diagnosis not present

## 2021-12-11 DIAGNOSIS — K567 Ileus, unspecified: Secondary | ICD-10-CM | POA: Diagnosis not present

## 2021-12-11 DIAGNOSIS — R7881 Bacteremia: Secondary | ICD-10-CM | POA: Diagnosis not present

## 2021-12-11 DIAGNOSIS — Z4682 Encounter for fitting and adjustment of non-vascular catheter: Secondary | ICD-10-CM | POA: Diagnosis not present

## 2021-12-12 DIAGNOSIS — K567 Ileus, unspecified: Secondary | ICD-10-CM | POA: Diagnosis not present

## 2021-12-13 DIAGNOSIS — B9561 Methicillin susceptible Staphylococcus aureus infection as the cause of diseases classified elsewhere: Secondary | ICD-10-CM | POA: Diagnosis not present

## 2021-12-13 DIAGNOSIS — K567 Ileus, unspecified: Secondary | ICD-10-CM | POA: Diagnosis not present

## 2021-12-13 DIAGNOSIS — B961 Klebsiella pneumoniae [K. pneumoniae] as the cause of diseases classified elsewhere: Secondary | ICD-10-CM | POA: Diagnosis not present

## 2021-12-13 DIAGNOSIS — Z452 Encounter for adjustment and management of vascular access device: Secondary | ICD-10-CM | POA: Diagnosis not present

## 2021-12-13 DIAGNOSIS — R7881 Bacteremia: Secondary | ICD-10-CM | POA: Diagnosis not present

## 2021-12-13 DIAGNOSIS — Z4682 Encounter for fitting and adjustment of non-vascular catheter: Secondary | ICD-10-CM | POA: Diagnosis not present

## 2021-12-18 DIAGNOSIS — R7881 Bacteremia: Secondary | ICD-10-CM | POA: Diagnosis not present

## 2021-12-18 DIAGNOSIS — B961 Klebsiella pneumoniae [K. pneumoniae] as the cause of diseases classified elsewhere: Secondary | ICD-10-CM | POA: Diagnosis not present

## 2021-12-18 DIAGNOSIS — M79671 Pain in right foot: Secondary | ICD-10-CM | POA: Diagnosis not present

## 2021-12-18 DIAGNOSIS — M25571 Pain in right ankle and joints of right foot: Secondary | ICD-10-CM | POA: Diagnosis not present

## 2021-12-18 DIAGNOSIS — B9561 Methicillin susceptible Staphylococcus aureus infection as the cause of diseases classified elsewhere: Secondary | ICD-10-CM | POA: Diagnosis not present

## 2021-12-19 DIAGNOSIS — B961 Klebsiella pneumoniae [K. pneumoniae] as the cause of diseases classified elsewhere: Secondary | ICD-10-CM | POA: Diagnosis not present

## 2021-12-19 DIAGNOSIS — M79671 Pain in right foot: Secondary | ICD-10-CM | POA: Diagnosis not present

## 2021-12-19 DIAGNOSIS — B9561 Methicillin susceptible Staphylococcus aureus infection as the cause of diseases classified elsewhere: Secondary | ICD-10-CM | POA: Diagnosis not present

## 2021-12-19 DIAGNOSIS — R7881 Bacteremia: Secondary | ICD-10-CM | POA: Diagnosis not present

## 2021-12-19 DIAGNOSIS — B965 Pseudomonas (aeruginosa) (mallei) (pseudomallei) as the cause of diseases classified elsewhere: Secondary | ICD-10-CM | POA: Diagnosis not present

## 2021-12-19 DIAGNOSIS — Z6827 Body mass index (BMI) 27.0-27.9, adult: Secondary | ICD-10-CM | POA: Diagnosis not present

## 2021-12-19 DIAGNOSIS — R600 Localized edema: Secondary | ICD-10-CM | POA: Diagnosis not present

## 2021-12-20 DIAGNOSIS — M79671 Pain in right foot: Secondary | ICD-10-CM | POA: Diagnosis not present

## 2021-12-20 DIAGNOSIS — Z6827 Body mass index (BMI) 27.0-27.9, adult: Secondary | ICD-10-CM | POA: Diagnosis not present

## 2021-12-20 DIAGNOSIS — B9561 Methicillin susceptible Staphylococcus aureus infection as the cause of diseases classified elsewhere: Secondary | ICD-10-CM | POA: Diagnosis not present

## 2021-12-20 DIAGNOSIS — R7881 Bacteremia: Secondary | ICD-10-CM | POA: Diagnosis not present

## 2021-12-20 DIAGNOSIS — M109 Gout, unspecified: Secondary | ICD-10-CM | POA: Diagnosis not present

## 2021-12-20 DIAGNOSIS — B964 Proteus (mirabilis) (morganii) as the cause of diseases classified elsewhere: Secondary | ICD-10-CM | POA: Diagnosis not present

## 2021-12-21 DIAGNOSIS — M109 Gout, unspecified: Secondary | ICD-10-CM | POA: Diagnosis not present

## 2021-12-21 DIAGNOSIS — B9561 Methicillin susceptible Staphylococcus aureus infection as the cause of diseases classified elsewhere: Secondary | ICD-10-CM | POA: Diagnosis not present

## 2021-12-21 DIAGNOSIS — M79671 Pain in right foot: Secondary | ICD-10-CM | POA: Diagnosis not present

## 2021-12-21 DIAGNOSIS — R7881 Bacteremia: Secondary | ICD-10-CM | POA: Diagnosis not present

## 2021-12-21 DIAGNOSIS — Z6827 Body mass index (BMI) 27.0-27.9, adult: Secondary | ICD-10-CM | POA: Diagnosis not present

## 2021-12-21 DIAGNOSIS — B965 Pseudomonas (aeruginosa) (mallei) (pseudomallei) as the cause of diseases classified elsewhere: Secondary | ICD-10-CM | POA: Diagnosis not present

## 2021-12-22 DIAGNOSIS — R7881 Bacteremia: Secondary | ICD-10-CM | POA: Diagnosis not present

## 2021-12-22 DIAGNOSIS — B9561 Methicillin susceptible Staphylococcus aureus infection as the cause of diseases classified elsewhere: Secondary | ICD-10-CM | POA: Diagnosis not present

## 2021-12-22 DIAGNOSIS — B961 Klebsiella pneumoniae [K. pneumoniae] as the cause of diseases classified elsewhere: Secondary | ICD-10-CM | POA: Diagnosis not present

## 2021-12-28 DIAGNOSIS — C61 Malignant neoplasm of prostate: Secondary | ICD-10-CM | POA: Diagnosis not present

## 2021-12-29 ENCOUNTER — Emergency Department (HOSPITAL_COMMUNITY)
Admission: EM | Admit: 2021-12-29 | Discharge: 2021-12-29 | Disposition: A | Discharge status: Home / Self Care | Payer: Medicaid Other | Attending: Emergency Medicine | Admitting: Emergency Medicine

## 2021-12-29 ENCOUNTER — Emergency Department (HOSPITAL_COMMUNITY): Payer: Medicaid Other

## 2021-12-29 ENCOUNTER — Encounter (HOSPITAL_COMMUNITY): Payer: Self-pay | Admitting: Emergency Medicine

## 2021-12-29 DIAGNOSIS — M109 Gout, unspecified: Secondary | ICD-10-CM | POA: Diagnosis not present

## 2021-12-29 DIAGNOSIS — R000 Tachycardia, unspecified: Secondary | ICD-10-CM | POA: Diagnosis not present

## 2021-12-29 DIAGNOSIS — N281 Cyst of kidney, acquired: Secondary | ICD-10-CM | POA: Diagnosis not present

## 2021-12-29 DIAGNOSIS — R1030 Lower abdominal pain, unspecified: Secondary | ICD-10-CM | POA: Diagnosis not present

## 2021-12-29 DIAGNOSIS — T8189XA Other complications of procedures, not elsewhere classified, initial encounter: Secondary | ICD-10-CM | POA: Diagnosis not present

## 2021-12-29 DIAGNOSIS — R1031 Right lower quadrant pain: Secondary | ICD-10-CM | POA: Diagnosis not present

## 2021-12-29 DIAGNOSIS — R509 Fever, unspecified: Secondary | ICD-10-CM | POA: Diagnosis not present

## 2021-12-29 DIAGNOSIS — K449 Diaphragmatic hernia without obstruction or gangrene: Secondary | ICD-10-CM | POA: Diagnosis not present

## 2021-12-29 DIAGNOSIS — R55 Syncope and collapse: Secondary | ICD-10-CM | POA: Diagnosis not present

## 2021-12-29 DIAGNOSIS — R6510 Systemic inflammatory response syndrome (SIRS) of non-infectious origin without acute organ dysfunction: Secondary | ICD-10-CM | POA: Diagnosis not present

## 2021-12-29 DIAGNOSIS — C61 Malignant neoplasm of prostate: Secondary | ICD-10-CM | POA: Diagnosis not present

## 2021-12-29 DIAGNOSIS — T888XXD Other specified complications of surgical and medical care, not elsewhere classified, subsequent encounter: Secondary | ICD-10-CM | POA: Diagnosis not present

## 2021-12-29 DIAGNOSIS — R002 Palpitations: Secondary | ICD-10-CM | POA: Diagnosis not present

## 2021-12-29 DIAGNOSIS — R569 Unspecified convulsions: Secondary | ICD-10-CM | POA: Diagnosis not present

## 2021-12-29 DIAGNOSIS — R1032 Left lower quadrant pain: Secondary | ICD-10-CM | POA: Diagnosis not present

## 2021-12-29 DIAGNOSIS — R300 Dysuria: Secondary | ICD-10-CM | POA: Diagnosis not present

## 2021-12-29 DIAGNOSIS — D72829 Elevated white blood cell count, unspecified: Secondary | ICD-10-CM | POA: Diagnosis not present

## 2021-12-29 DIAGNOSIS — R9341 Abnormal radiologic findings on diagnostic imaging of renal pelvis, ureter, or bladder: Secondary | ICD-10-CM | POA: Diagnosis not present

## 2021-12-29 DIAGNOSIS — Z5321 Procedure and treatment not carried out due to patient leaving prior to being seen by health care provider: Secondary | ICD-10-CM | POA: Insufficient documentation

## 2021-12-29 DIAGNOSIS — I10 Essential (primary) hypertension: Secondary | ICD-10-CM | POA: Diagnosis not present

## 2021-12-29 DIAGNOSIS — R1084 Generalized abdominal pain: Secondary | ICD-10-CM | POA: Diagnosis not present

## 2021-12-29 DIAGNOSIS — Z79891 Long term (current) use of opiate analgesic: Secondary | ICD-10-CM | POA: Diagnosis not present

## 2021-12-29 DIAGNOSIS — T83028A Displacement of other indwelling urethral catheter, initial encounter: Secondary | ICD-10-CM | POA: Diagnosis not present

## 2021-12-29 DIAGNOSIS — R42 Dizziness and giddiness: Secondary | ICD-10-CM | POA: Diagnosis not present

## 2021-12-29 DIAGNOSIS — I499 Cardiac arrhythmia, unspecified: Secondary | ICD-10-CM | POA: Diagnosis not present

## 2021-12-29 DIAGNOSIS — E785 Hyperlipidemia, unspecified: Secondary | ICD-10-CM | POA: Diagnosis not present

## 2021-12-29 LAB — COMPREHENSIVE METABOLIC PANEL
ALT: 7 U/L (ref 0–44)
AST: 17 U/L (ref 15–41)
Albumin: 3.2 g/dL — ABNORMAL LOW (ref 3.5–5.0)
Alkaline Phosphatase: 100 U/L (ref 38–126)
Anion gap: 5 (ref 5–15)
BUN: 10 mg/dL (ref 6–20)
CO2: 25 mmol/L (ref 22–32)
Calcium: 11.4 mg/dL — ABNORMAL HIGH (ref 8.9–10.3)
Chloride: 108 mmol/L (ref 98–111)
Creatinine, Ser: 1.44 mg/dL — ABNORMAL HIGH (ref 0.61–1.24)
GFR, Estimated: 56 mL/min — ABNORMAL LOW (ref >60)
Glucose, Bld: 111 mg/dL — ABNORMAL HIGH (ref 70–99)
Potassium: 4.1 mmol/L (ref 3.5–5.1)
Sodium: 138 mmol/L (ref 135–145)
Total Bilirubin: 0.9 mg/dL (ref 0.3–1.2)
Total Protein: 7.3 g/dL (ref 6.5–8.1)

## 2021-12-29 LAB — CBC
HCT: 36.2 % — ABNORMAL LOW (ref 39.0–52.0)
Hemoglobin: 11.7 g/dL — ABNORMAL LOW (ref 13.0–17.0)
MCH: 32.4 pg (ref 26.0–34.0)
MCHC: 32.3 g/dL (ref 30.0–36.0)
MCV: 100.3 fL — ABNORMAL HIGH (ref 80.0–100.0)
Platelets: 350 10*3/uL (ref 150–400)
RBC: 3.61 MIL/uL — ABNORMAL LOW (ref 4.22–5.81)
RDW: 12.8 % (ref 11.5–15.5)
WBC: 14.7 10*3/uL — ABNORMAL HIGH (ref 4.0–10.5)
nRBC: 0 % (ref 0.0–0.2)

## 2021-12-29 LAB — LIPASE, BLOOD: Lipase: 26 U/L (ref 11–51)

## 2021-12-29 LAB — MAGNESIUM: Magnesium: 1.6 mg/dL — ABNORMAL LOW (ref 1.7–2.4)

## 2021-12-29 LAB — TROPONIN I (HIGH SENSITIVITY): Troponin I (High Sensitivity): 15 ng/L (ref <18)

## 2021-12-29 NOTE — ED Notes (Signed)
This patient's spouse informed this EMT that she would be taking him to another facility to be seen

## 2021-12-29 NOTE — ED Triage Notes (Signed)
Patient BIB [facility] from a barber shop (patient is a resident of [facility] [employer]) after patient had a loss of consciousness. Reported laparoscopic surgery three weeks ago to look for an abscess but EMS states family told them the surgeon was unable to locate it. Patient is alert, oriented, and in no apparent distress at this time.

## 2021-12-29 NOTE — ED Provider Triage Note (Signed)
Emergency Medicine Provider Triage Evaluation Note  Todd Craig , a 61 y.o. male  was evaluated in triage.  Pt complains of syncopal episode today.  Patient reports he was in his haircut when he felt lightheaded and that he was "going to fall out".  He reports he slumped over in the barber chair but "came to" quickly.  He denies any chest pain or shortness of breath beforehand.  Reports he feels fine now.  It appears the patient was recently admitted to [facility] 12-06-2021 for abdominal pain in the setting of sepsis.  He denies falling out of the chair.  Denies hitting his head.  Denies any pain.  Review of Systems  Positive:  Negative:   Physical Exam  BP 114/85 (BP Location: Left Arm)   Pulse 88   Temp 99.1 F (37.3 C) (Oral)   Resp 18   SpO2 100%  Gen:   Awake, no distress   Resp:  Normal effort  MSK:   Moves extremities without difficulty  Other:  Alert and oriented.  Answering questions appropriately.  Medical Decision Making  Medically screening exam initiated at 3:28 PM.  Appropriate orders placed.  Todd Craig was informed that the remainder of the evaluation will be completed by another provider, this initial triage assessment does not replace that evaluation, and the importance of remaining in the ED until their evaluation is complete.  Will order labs   Todd Craig, [place] 12/29/21 1529

## 2021-12-30 DIAGNOSIS — E785 Hyperlipidemia, unspecified: Secondary | ICD-10-CM | POA: Diagnosis not present

## 2021-12-30 DIAGNOSIS — T8189XA Other complications of procedures, not elsewhere classified, initial encounter: Secondary | ICD-10-CM | POA: Diagnosis not present

## 2021-12-30 DIAGNOSIS — T83028A Displacement of other indwelling urethral catheter, initial encounter: Secondary | ICD-10-CM | POA: Diagnosis not present

## 2021-12-30 DIAGNOSIS — I10 Essential (primary) hypertension: Secondary | ICD-10-CM | POA: Diagnosis not present

## 2021-12-30 DIAGNOSIS — C61 Malignant neoplasm of prostate: Secondary | ICD-10-CM | POA: Diagnosis not present

## 2021-12-30 DIAGNOSIS — Z79891 Long term (current) use of opiate analgesic: Secondary | ICD-10-CM | POA: Diagnosis not present

## 2021-12-30 DIAGNOSIS — M109 Gout, unspecified: Secondary | ICD-10-CM | POA: Diagnosis not present

## 2021-12-30 DIAGNOSIS — N281 Cyst of kidney, acquired: Secondary | ICD-10-CM | POA: Diagnosis not present

## 2021-12-30 DIAGNOSIS — R000 Tachycardia, unspecified: Secondary | ICD-10-CM | POA: Diagnosis not present

## 2021-12-30 DIAGNOSIS — R9341 Abnormal radiologic findings on diagnostic imaging of renal pelvis, ureter, or bladder: Secondary | ICD-10-CM | POA: Diagnosis not present

## 2021-12-30 DIAGNOSIS — R6510 Systemic inflammatory response syndrome (SIRS) of non-infectious origin without acute organ dysfunction: Secondary | ICD-10-CM | POA: Diagnosis not present

## 2021-12-30 DIAGNOSIS — R1031 Right lower quadrant pain: Secondary | ICD-10-CM | POA: Diagnosis not present

## 2021-12-30 DIAGNOSIS — T888XXD Other specified complications of surgical and medical care, not elsewhere classified, subsequent encounter: Secondary | ICD-10-CM | POA: Diagnosis not present

## 2021-12-30 DIAGNOSIS — R1032 Left lower quadrant pain: Secondary | ICD-10-CM | POA: Diagnosis not present

## 2021-12-30 DIAGNOSIS — R509 Fever, unspecified: Secondary | ICD-10-CM | POA: Diagnosis not present

## 2021-12-30 DIAGNOSIS — D72829 Elevated white blood cell count, unspecified: Secondary | ICD-10-CM | POA: Diagnosis not present

## 2021-12-30 DIAGNOSIS — K449 Diaphragmatic hernia without obstruction or gangrene: Secondary | ICD-10-CM | POA: Diagnosis not present

## 2022-01-01 DIAGNOSIS — R531 Weakness: Secondary | ICD-10-CM | POA: Diagnosis not present

## 2022-01-01 DIAGNOSIS — Z978 Presence of other specified devices: Secondary | ICD-10-CM | POA: Diagnosis not present

## 2022-01-01 DIAGNOSIS — C61 Malignant neoplasm of prostate: Secondary | ICD-10-CM | POA: Diagnosis not present

## 2022-01-01 DIAGNOSIS — E785 Hyperlipidemia, unspecified: Secondary | ICD-10-CM | POA: Diagnosis not present

## 2022-01-01 DIAGNOSIS — R7881 Bacteremia: Secondary | ICD-10-CM | POA: Diagnosis not present

## 2022-01-01 DIAGNOSIS — R52 Pain, unspecified: Secondary | ICD-10-CM | POA: Diagnosis not present

## 2022-01-01 DIAGNOSIS — I10 Essential (primary) hypertension: Secondary | ICD-10-CM | POA: Diagnosis not present

## 2022-01-02 DIAGNOSIS — E785 Hyperlipidemia, unspecified: Secondary | ICD-10-CM | POA: Diagnosis not present

## 2022-01-02 DIAGNOSIS — I10 Essential (primary) hypertension: Secondary | ICD-10-CM | POA: Diagnosis not present

## 2022-01-04 DIAGNOSIS — E7800 Pure hypercholesterolemia, unspecified: Secondary | ICD-10-CM | POA: Diagnosis not present

## 2022-01-08 DIAGNOSIS — R52 Pain, unspecified: Secondary | ICD-10-CM | POA: Diagnosis not present

## 2022-01-08 DIAGNOSIS — E785 Hyperlipidemia, unspecified: Secondary | ICD-10-CM | POA: Diagnosis not present

## 2022-01-08 DIAGNOSIS — R531 Weakness: Secondary | ICD-10-CM | POA: Diagnosis not present

## 2022-01-08 DIAGNOSIS — R7881 Bacteremia: Secondary | ICD-10-CM | POA: Diagnosis not present

## 2022-01-08 DIAGNOSIS — Z978 Presence of other specified devices: Secondary | ICD-10-CM | POA: Diagnosis not present

## 2022-01-08 DIAGNOSIS — C61 Malignant neoplasm of prostate: Secondary | ICD-10-CM | POA: Diagnosis not present

## 2022-01-08 DIAGNOSIS — I10 Essential (primary) hypertension: Secondary | ICD-10-CM | POA: Diagnosis not present

## 2022-01-16 ENCOUNTER — Telehealth: Payer: Self-pay | Admitting: Nurse Practitioner

## 2022-01-16 DIAGNOSIS — Z7409 Other reduced mobility: Secondary | ICD-10-CM

## 2022-01-16 NOTE — Telephone Encounter (Signed)
Home Health Verbal Orders - Caller/Agency: miriam from [facility] Callback Number: [phone] Requesting PT Frequency: evaluation,  also requesting, shower chair and walker

## 2022-01-17 ENCOUNTER — Encounter: Payer: Self-pay | Admitting: Nurse Practitioner

## 2022-01-17 DIAGNOSIS — Z7409 Other reduced mobility: Secondary | ICD-10-CM | POA: Insufficient documentation

## 2022-01-17 NOTE — Telephone Encounter (Signed)
Spoke with Prentiss Bells from [employer] and she is says the patient was discharged from the hospital without anything. Prentiss Bells says they are requesting an order for PT preferably with [facility] PT department. Also, requesting the DME orders listed below. There was also a prior authorization request placed in provider's folder for signature. Please advise.

## 2022-01-17 NOTE — Telephone Encounter (Signed)
Spoke with Todd Craig and she stated that she would like patient to be seen sooner than later to lay eyes on patient due to issues with catheter and pain. She also would like orders for a home health nurse to go and check on patient. Will try to get patient scheduled with Erin.

## 2022-01-18 ENCOUNTER — Telehealth: Payer: Self-pay | Admitting: Nurse Practitioner

## 2022-01-18 ENCOUNTER — Ambulatory Visit: Payer: Medicaid Other | Admitting: Physician Assistant

## 2022-01-18 ENCOUNTER — Encounter: Payer: Self-pay | Admitting: Physician Assistant

## 2022-01-18 VITALS — BP 123/82 | HR 112 | Temp 97.9°F | Ht 74.49 in | Wt 202.0 lb

## 2022-01-18 DIAGNOSIS — C61 Malignant neoplasm of prostate: Secondary | ICD-10-CM | POA: Diagnosis not present

## 2022-01-18 DIAGNOSIS — R1030 Lower abdominal pain, unspecified: Secondary | ICD-10-CM

## 2022-01-18 DIAGNOSIS — Z9079 Acquired absence of other genital organ(s): Secondary | ICD-10-CM | POA: Diagnosis not present

## 2022-01-18 MED ORDER — OXYCODONE-ACETAMINOPHEN 2.5-325 MG PO TABS: 1.0000 | ORAL_TABLET | Freq: Four times a day (QID) | ORAL | 0 refills | Status: DC | PRN

## 2022-01-18 MED ORDER — OXYCODONE-ACETAMINOPHEN 5-325 MG PO TABS: 1.0000 | ORAL_TABLET | Freq: Four times a day (QID) | ORAL | 0 refills | Status: AC | PRN

## 2022-01-18 NOTE — Telephone Encounter (Signed)
Heather at [facility] called saying they do not have the Percocet 2.5 mg but they do have the 5 mg.  Can he take a 1/2 tablet and use this.  Please call back [phone]

## 2022-01-18 NOTE — Progress Notes (Signed)
Established Patient Office Visit  Name: Todd Craig   MRN: [id number]    DOB: 04-01-1961   Date:01/22/2022  Today's Provider: Talitha Givens, MHS, PA-C Introduced myself to the patient as a PA-C and provided education on APPs in clinical practice.         Subjective  Chief Complaint  Chief Complaint  Patient presents with   S/P surgery    Was released from Rehab a week ago with no medications or home heath care. Patient is having trouble walking, legs are bothering him, under surgery site is painful, Cant sleep at night.     HPI  Patient has had prostatectomy 11/28/21  Patient reports he is feeling "terrible" today Reports he is having pain along lower abdomen and in back He states his inner thighs are also hurting - he denies bruising or skin changes in this area but states this has been ongoing since operation  He is having trouble sleeping at night and a lot of pain- he and his wife report he is having difficulty sleeping   He has apt with Urology in Nov He has apt with Infectious disease next week  His wife states his catheter and tubing has not been changed since 12/30/21 He has not seen Urology lately He reports he has not had home health care at home       Patient Active Problem List   Diagnosis Date Noted   H/O abdominal prostatectomy 01/19/2022   Poor mobility 01/17/2022   Prostate cancer ([place]) 09/18/2021   Cyst of left kidney 07/09/2021   Other hyperparathyroidism ([place]) 05/08/2021   Adhesive capsulitis of right shoulder 03/02/2021   H/O: stroke 02/18/2021   GERD without esophagitis 02/18/2021   Gout 02/18/2021   Alcohol use 02/18/2021   Elevated PSA 03/10/2020   Vitamin D deficiency 03/10/2020   Persistent proteinuria 03/10/2020   Stage 3a chronic kidney disease ([place]) 03/10/2020   Hypercholesteremia 03/10/2020   Hypercalcemia 02/17/2020   Primary hypertension 01/19/2020   Nicotine dependence due to vaping tobacco product 01/19/2020   Overweight  with body mass index (BMI) 25.0-29.9 01/19/2020    Past Surgical History:  Procedure Laterality Date   PROSTATE BIOPSY N/A 08/01/2021   Procedure: PROSTATE BIOPSY;  Surgeon: Abbie Sons, MD;  Location: [facility] ORS;  Service: Urology;  Laterality: N/A;   right hand repair  1985   trauma with saw laceration    TRANSRECTAL ULTRASOUND N/A 08/01/2021   Procedure: TRANSRECTAL ULTRASOUND;  Surgeon: Abbie Sons, MD;  Location: [facility] ORS;  Service: Urology;  Laterality: N/A;    History reviewed. No pertinent family history.  Social History   Tobacco Use   Smoking status: Every Day    Packs/day: 0.50    Types: Cigarettes   Smokeless tobacco: Never  Substance Use Topics   Alcohol use: Yes    Alcohol/week: 5.0 standard drinks of alcohol    Types: 5 Cans of beer per week    Comment: on weekends     Current Outpatient Medications:    amLODipine (NORVASC) 10 MG tablet, Take 1 tablet (10 mg total) by mouth daily., Disp: 90 tablet, Rfl: 4   losartan (COZAAR) 100 MG tablet, Take 100 mg by mouth daily., Disp: , Rfl:    oxyCODONE-acetaminophen (PERCOCET/ROXICET) 5-325 MG tablet, Take 1 tablet by mouth every 6 (six) hours as needed for up to 5 days for severe pain., Disp: 20 tablet, Rfl: 0   rosuvastatin (CRESTOR) 20 MG  tablet, Take 1 tablet (20 mg total) by mouth daily., Disp: 90 tablet, Rfl: 4  Allergies  Allergen Reactions   Tramadol     Other reaction(s): Other    I personally reviewed active problem list, medication list, allergies, notes from last encounter, notes from last 6 encounters, lab results with the patient/caregiver today.   Review of Systems  Constitutional:  Positive for malaise/fatigue. Negative for chills and fever.  Gastrointestinal:  Positive for abdominal pain. Negative for constipation, diarrhea and nausea.  Genitourinary:  Positive for flank pain.       Reports pain along glans penis where catheter is inserted.   Musculoskeletal:  Positive for back pain.  Negative for falls.      Objective  Vitals:   01/18/22 1315  BP: 123/82  Pulse: (!) 112  Temp: 97.9 F (36.6 C)  TempSrc: Oral  SpO2: 98%  Weight: 202 lb (91.6 kg)  Height: 6' 2.49" (1.892 m)    Body mass index is 25.6 kg/m.  Physical Exam Vitals reviewed.  Constitutional:      General: He is awake.     Appearance: Normal appearance. He is well-developed and well-groomed. He is ill-appearing.     Comments: Patient is constantly repositioning and stretching in chair to  achieve more comfortable position Breathing seems mildly labored and patient is a bit distant in regards to questions He is using crutches to ambulate but needed to sit down abruptly as he was trying to leave- no falls, used rolling chair to stabilize and he was able to leave on own with assistance from partner.    HENT:     Head: Normocephalic and atraumatic.  Pulmonary:     Effort: Prolonged expiration present.  Abdominal:     General: Abdomen is flat. A surgical scar is present. Bowel sounds are normal.     Palpations: Abdomen is soft.     Tenderness: There is abdominal tenderness in the suprapubic area.    Neurological:     General: No focal deficit present.     Mental Status: He is alert and oriented to person, place, and time.     GCS: GCS eye subscore is 4. GCS verbal subscore is 5. GCS motor subscore is 6.     Cranial Nerves: No dysarthria or facial asymmetry.     Motor: No tremor.  Psychiatric:        Attention and Perception: He is inattentive.        Mood and Affect: Mood is anxious.        Behavior: Behavior is agitated. Behavior is cooperative.        Thought Content: Thought content normal.        Cognition and Memory: Cognition normal.        Judgment: Judgment normal.      Recent Results (from the past 2160 hour(s))  CBC     Status: Abnormal   Collection Time: 12/29/21  3:05 PM  Result Value Ref Range   WBC 14.7 (H) 4.0 - 10.5 K/uL   RBC 3.61 (L) 4.22 - 5.81 MIL/uL    Hemoglobin 11.7 (L) 13.0 - 17.0 g/dL   HCT 36.2 (L) 39.0 - 52.0 %   MCV 100.3 (H) 80.0 - 100.0 fL   MCH 32.4 26.0 - 34.0 pg   MCHC 32.3 30.0 - 36.0 g/dL   RDW 12.8 11.5 - 15.5 %   Platelets 350 150 - 400 K/uL   nRBC 0.0 0.0 - 0.2 %  Comment: Performed at [facility], [place] [address] [place], [place] [address]  Comprehensive metabolic panel     Status: Abnormal   Collection Time: 12/29/21  3:05 PM  Result Value Ref Range   Sodium 138 135 - 145 mmol/L   Potassium 4.1 3.5 - 5.1 mmol/L   Chloride 108 98 - 111 mmol/L   CO2 25 22 - 32 mmol/L   Glucose, Bld 111 (H) 70 - 99 mg/dL    Comment: Glucose reference range applies only to samples taken after fasting for at least 8 hours.   BUN 10 6 - 20 mg/dL   Creatinine, Ser 1.44 (H) 0.61 - 1.24 mg/dL   Calcium 11.4 (H) 8.9 - 10.3 mg/dL   Total Protein 7.3 6.5 - 8.1 g/dL   Albumin 3.2 (L) 3.5 - 5.0 g/dL   AST 17 15 - 41 U/L   ALT 7 0 - 44 U/L   Alkaline Phosphatase 100 38 - 126 U/L   Total Bilirubin 0.9 0.3 - 1.2 mg/dL   GFR, Estimated 56 (L) >60 mL/min    Comment: (NOTE) Calculated using the CKD-EPI Creatinine Equation (2021)    Anion gap 5 5 - 15    Comment: Performed at [facility], [place] [address], [place], [place] [address]  Lipase, blood     Status: None   Collection Time: 12/29/21  3:05 PM  Result Value Ref Range   Lipase 26 11 - 51 U/L    Comment: Performed at [facility], [place] [address], [place], [place] [address]  Troponin I (High Sensitivity)     Status: None   Collection Time: 12/29/21  3:20 PM  Result Value Ref Range   Troponin I (High Sensitivity) 15 <18 ng/L    Comment: (NOTE) Elevated high sensitivity troponin I (hsTnI) values and significant  changes across serial measurements may suggest ACS but many other  chronic and acute conditions are known to elevate hsTnI results.  Refer to the "Links" section for chest pain algorithms and additional  guidance. Performed at [facility], [place] [address], [place], [place] [address]   Magnesium     Status: Abnormal   Collection Time: 12/29/21  4:00 PM  Result Value Ref Range   Magnesium 1.6 (L) 1.7 - 2.4 mg/dL    Comment: Performed at [place] [address], [place], [place] [address]  CBC w/Diff     Status: Abnormal   Collection Time: 01/18/22  2:25 PM  Result Value Ref Range   WBC 6.0 3.4 - 10.8 x10E3/uL   RBC 4.13 (L) 4.14 - 5.80 x10E6/uL   Hemoglobin 13.2 13.0 - 17.7 g/dL   Hematocrit 37.8 37.5 - 51.0 %   MCV 92 79 - 97 fL   MCH 32.0 26.6 - 33.0 pg   MCHC 34.9 31.5 - 35.7 g/dL   RDW 12.7 11.6 - 15.4 %   Platelets 259 150 - 450 x10E3/uL   Neutrophils 49 Not Estab. %   Lymphs 39 Not Estab. %   Monocytes 8 Not Estab. %   Eos 3 Not Estab. %   Basos 1 Not Estab. %   Neutrophils Absolute 3.0 1.4 - 7.0 x10E3/uL   Lymphocytes Absolute 2.3 0.7 - 3.1 x10E3/uL   Monocytes Absolute 0.5 0.1 - 0.9 x10E3/uL   EOS (ABSOLUTE) 0.2 0.0 - 0.4 x10E3/uL   Basophils Absolute 0.0 0.0 - 0.2 x10E3/uL   Immature Granulocytes 0 Not Estab. %  Immature Grans (Abs) 0.0 0.0 - 0.1 x10E3/uL  Comp Met (CMET)     Status: Abnormal   Collection Time: 01/18/22  2:25 PM  Result Value Ref Range   Glucose 100 (H) 70 - 99 mg/dL   BUN 9 8 - 27 mg/dL   Creatinine, Ser 0.76 0.76 - 1.27 mg/dL   eGFR 103 >59 mL/min/1.73   BUN/Creatinine Ratio 12 10 - 24   Sodium 136 134 - 144 mmol/L   Potassium 4.3 3.5 - 5.2 mmol/L   Chloride 98 96 - 106 mmol/L   CO2 21 20 - 29 mmol/L   Calcium 9.8 8.6 - 10.2 mg/dL   Total Protein 7.1 6.0 - 8.5 g/dL   Albumin 4.5 3.8 - 4.9 g/dL   Globulin, Total 2.6 1.5 - 4.5 g/dL   Albumin/Globulin Ratio 1.7 1.2 - 2.2   Bilirubin Total 0.3 0.0 - 1.2 mg/dL   Alkaline Phosphatase 54 44 - 121 IU/L   AST 17 0 - 40 IU/L   ALT 13 0 - 44 IU/L     PHQ2/9:    01/18/2022    1:55 PM 06/01/2021   10:49 AM 03/02/2021   11:24 AM 02/16/2020   11:12 AM 01/11/2020   10:57 AM  Depression screen PHQ 2/9  Decreased Interest 2 1 1  0 2   Down, Depressed, Hopeless 2 0 0 0 0  PHQ - 2 Score 4 1 1  0 2  Altered sleeping 3 0 2 0 3  Tired, decreased energy 3 1 2  0 3  Change in appetite 0 0 0 0 0  Feeling bad or failure about yourself  2 0 0 0 1  Trouble concentrating 2 0 1 0 3  Moving slowly or fidgety/restless 1 1 0 0 2  Suicidal thoughts 0 0 0 0 0  PHQ-9 Score 15 3 6  0 14  Difficult doing work/chores Very difficult  Somewhat difficult Not difficult at all Not difficult at all      Fall Risk:    01/18/2022    1:55 PM 06/01/2021   10:49 AM 03/08/2020   10:27 AM 01/11/2020   10:37 AM  Fall Risk   Falls in the past year? 1 0 0 1  Number falls in past yr: 1 0  1  Injury with Fall? 1 0  0  Risk for fall due to : Impaired mobility;Impaired balance/gait No Fall Risks  History of fall(s)  Follow up Falls evaluation completed Falls evaluation completed Falls evaluation completed Falls evaluation completed      Functional Status Survey:      Assessment & Plan  Problem List Items Addressed This Visit       Genitourinary   Prostate cancer [facility]) - Primary    Patient had abdominal prostatectomy on 11/28/21 with [facility] urology        Relevant Orders   Ambulatory referral to [place]     Other   H/O abdominal prostatectomy    Acute on chronic, ongoing discomfort and concerns He has a catheter in place and is followed by [facility] urology Prostatectomy was performed on 11/28/21 and he was hospitalized for sepsis on 12/06/21 -12/28/21 before being discharged to SNF  He and his wife report ongoing difficulty with pain and concern for catheter as they have not had home health or instruction for home management Home health orders placed and will contact [facility] Urology to get patient evaluated in their office sooner than Nov for catheter discomfort - called on 01/19/22 and  spoke with Baxter Flattery who stated that she had requests in at two offices for him to be seen and was awaiting response to schedule him Will get CBC and CMP for monitoring  given sepsis hx  Will provide Percocet for 5 days to assist with discomfort so he can rest.  Follow up as needed for continued collaboration with urology and monitoring of progress.       Relevant Medications   oxyCODONE-acetaminophen (PERCOCET/ROXICET) 5-325 MG tablet   Other Relevant Orders   Ambulatory referral to [place] w/Diff (Completed)   Comp Met (CMET) (Completed)   Other Visit Diagnoses     Lower abdominal pain     Acute, ongoing Suspect this is secondary to catheter and recent prostatectomy See above for A&P management         No follow-ups on file.   I, Tayvon Culley E Makaelah Cranfield, PA-C, have reviewed all documentation for this visit. The documentation on 01/22/22 for the exam, diagnosis, procedures, and orders are all accurate and complete.   Talitha Givens, MHS, PA-C [facility]

## 2022-01-18 NOTE — Patient Instructions (Addendum)
I would like you to call the Urology office and ask for an apt for his catheter to be evaluated- this is likely the source of his pain and discomfort and is very important that he is evaluated by them for this.   I am sending in your orders for Home Health so they should be calling soon to come out and help at home  I am sending in some pain medication for you so you can hopefully sleep and recover better. Please only use them according to the instructions provided and do not drive or operate machinery while taking. Do not take more than prescribed or more frequently

## 2022-01-19 DIAGNOSIS — Z9079 Acquired absence of other genital organ(s): Secondary | ICD-10-CM | POA: Insufficient documentation

## 2022-01-19 DIAGNOSIS — M1990 Unspecified osteoarthritis, unspecified site: Secondary | ICD-10-CM | POA: Diagnosis not present

## 2022-01-19 LAB — COMPREHENSIVE METABOLIC PANEL
ALT: 13 IU/L (ref 0–44)
AST: 17 IU/L (ref 0–40)
Albumin/Globulin Ratio: 1.7 (ref 1.2–2.2)
Albumin: 4.5 g/dL (ref 3.8–4.9)
Alkaline Phosphatase: 54 IU/L (ref 44–121)
BUN/Creatinine Ratio: 12 (ref 10–24)
BUN: 9 mg/dL (ref 8–27)
Bilirubin Total: 0.3 mg/dL (ref 0.0–1.2)
CO2: 21 mmol/L (ref 20–29)
Calcium: 9.8 mg/dL (ref 8.6–10.2)
Chloride: 98 mmol/L (ref 96–106)
Creatinine, Ser: 0.76 mg/dL (ref 0.76–1.27)
Globulin, Total: 2.6 g/dL (ref 1.5–4.5)
Glucose: 100 mg/dL — ABNORMAL HIGH (ref 70–99)
Potassium: 4.3 mmol/L (ref 3.5–5.2)
Sodium: 136 mmol/L (ref 134–144)
Total Protein: 7.1 g/dL (ref 6.0–8.5)
eGFR: 103 mL/min/{1.73_m2} (ref >59)

## 2022-01-19 LAB — CBC WITH DIFFERENTIAL/PLATELET
Basophils Absolute: 0 10*3/uL (ref 0.0–0.2)
Basos: 1 %
EOS (ABSOLUTE): 0.2 10*3/uL (ref 0.0–0.4)
Eos: 3 %
Hematocrit: 37.8 % (ref 37.5–51.0)
Hemoglobin: 13.2 g/dL (ref 13.0–17.7)
Immature Grans (Abs): 0 10*3/uL (ref 0.0–0.1)
Immature Granulocytes: 0 %
Lymphocytes Absolute: 2.3 10*3/uL (ref 0.7–3.1)
Lymphs: 39 %
MCH: 32 pg (ref 26.6–33.0)
MCHC: 34.9 g/dL (ref 31.5–35.7)
MCV: 92 fL (ref 79–97)
Monocytes Absolute: 0.5 10*3/uL (ref 0.1–0.9)
Monocytes: 8 %
Neutrophils Absolute: 3 10*3/uL (ref 1.4–7.0)
Neutrophils: 49 %
Platelets: 259 10*3/uL (ref 150–450)
RBC: 4.13 x10E6/uL — ABNORMAL LOW (ref 4.14–5.80)
RDW: 12.7 % (ref 11.6–15.4)
WBC: 6 10*3/uL (ref 3.4–10.8)

## 2022-01-19 NOTE — Telephone Encounter (Signed)
Patient was seen by Talitha Givens, PA on 01/18/22.

## 2022-01-19 NOTE — Assessment & Plan Note (Signed)
Patient had abdominal prostatectomy on 11/28/21 with [facility] urology

## 2022-01-20 DIAGNOSIS — R000 Tachycardia, unspecified: Secondary | ICD-10-CM | POA: Diagnosis not present

## 2022-01-20 DIAGNOSIS — T83021A Displacement of indwelling urethral catheter, initial encounter: Secondary | ICD-10-CM | POA: Diagnosis not present

## 2022-01-20 DIAGNOSIS — Z8546 Personal history of malignant neoplasm of prostate: Secondary | ICD-10-CM | POA: Diagnosis not present

## 2022-01-20 DIAGNOSIS — Z885 Allergy status to narcotic agent status: Secondary | ICD-10-CM | POA: Diagnosis not present

## 2022-01-20 DIAGNOSIS — Z9079 Acquired absence of other genital organ(s): Secondary | ICD-10-CM | POA: Diagnosis not present

## 2022-01-20 DIAGNOSIS — Z466 Encounter for fitting and adjustment of urinary device: Secondary | ICD-10-CM | POA: Diagnosis not present

## 2022-01-22 DIAGNOSIS — B9562 Methicillin resistant Staphylococcus aureus infection as the cause of diseases classified elsewhere: Secondary | ICD-10-CM | POA: Diagnosis not present

## 2022-01-22 DIAGNOSIS — Z9079 Acquired absence of other genital organ(s): Secondary | ICD-10-CM | POA: Diagnosis not present

## 2022-01-22 DIAGNOSIS — R188 Other ascites: Secondary | ICD-10-CM | POA: Diagnosis not present

## 2022-01-22 DIAGNOSIS — C61 Malignant neoplasm of prostate: Secondary | ICD-10-CM | POA: Diagnosis not present

## 2022-01-22 DIAGNOSIS — Z6826 Body mass index (BMI) 26.0-26.9, adult: Secondary | ICD-10-CM | POA: Diagnosis not present

## 2022-01-22 DIAGNOSIS — A498 Other bacterial infections of unspecified site: Secondary | ICD-10-CM | POA: Diagnosis not present

## 2022-01-22 DIAGNOSIS — R7881 Bacteremia: Secondary | ICD-10-CM | POA: Diagnosis not present

## 2022-01-22 NOTE — Assessment & Plan Note (Signed)
Acute on chronic, ongoing discomfort and concerns He has a catheter in place and is followed by [facility] urology Prostatectomy was performed on 11/28/21 and he was hospitalized for sepsis on 12/06/21 -12/28/21 before being discharged to SNF  He and his wife report ongoing difficulty with pain and concern for catheter as they have not had home health or instruction for home management Home health orders placed and will contact [facility] Urology to get patient evaluated in their office sooner than Nov for catheter discomfort - called on 01/19/22 and spoke with Baxter Flattery who stated that she had requests in at two offices for him to be seen and was awaiting response to schedule him Will get CBC and CMP for monitoring given sepsis hx  Will provide Percocet for 5 days to assist with discomfort so he can rest.  Follow up as needed for continued collaboration with urology and monitoring of progress.

## 2022-01-23 ENCOUNTER — Telehealth: Payer: Self-pay | Admitting: Nurse Practitioner

## 2022-01-23 ENCOUNTER — Ambulatory Visit: Payer: Medicaid Other | Admitting: Nurse Practitioner

## 2022-01-23 DIAGNOSIS — C61 Malignant neoplasm of prostate: Secondary | ICD-10-CM

## 2022-01-23 NOTE — Telephone Encounter (Signed)
Pt's spouse [name] called in to request to order a hospital bed for pt. She said that pt's bed is currently on the floor so it is hard for him to get out of bed. Spouse says that pt is not very mobile.    Please assist further.   [place]

## 2022-01-24 NOTE — Telephone Encounter (Signed)
Requested DME order along with patient's demographics and insurance for hospital bed has been faxed over to [employer] store.

## 2022-01-24 NOTE — Telephone Encounter (Signed)
DME order placed on provider's desk for signature

## 2022-01-25 ENCOUNTER — Telehealth: Payer: Self-pay | Admitting: Nurse Practitioner

## 2022-01-25 NOTE — Telephone Encounter (Signed)
[facility] called to see if an order for a walker and a wheel chair can also be ordered from [facility] / please advise

## 2022-01-26 NOTE — Telephone Encounter (Signed)
Left a message for Diane with [facility] in regards to call about patient orders. Patient DME orders were placed and signed by provider and faxed to [facility]. Advised to give the [place] a call or reach out to our office.

## 2022-01-29 ENCOUNTER — Telehealth: Payer: Self-pay | Admitting: Nurse Practitioner

## 2022-01-29 DIAGNOSIS — C61 Malignant neoplasm of prostate: Secondary | ICD-10-CM | POA: Diagnosis not present

## 2022-01-29 NOTE — Telephone Encounter (Signed)
LVM asking patient to call back to schedule an appointment 

## 2022-01-29 NOTE — Telephone Encounter (Signed)
Todd Craig returned call and scheduled appt for first available with PCP 02/05/2022 11AM.

## 2022-01-29 NOTE — Telephone Encounter (Signed)
Caller needing following information for hospital bed referral faxed:  Office notes from most recent visit w/ provider  Any document stating there is an immediate change in body position  Document stating pt can independently operate bed controls  Fax [phone]  [phone]

## 2022-01-29 NOTE — Telephone Encounter (Signed)
Noted  

## 2022-01-30 ENCOUNTER — Ambulatory Visit: Payer: Self-pay

## 2022-01-30 ENCOUNTER — Telehealth: Payer: Self-pay | Admitting: Nurse Practitioner

## 2022-01-30 DIAGNOSIS — C61 Malignant neoplasm of prostate: Secondary | ICD-10-CM | POA: Diagnosis not present

## 2022-01-30 NOTE — Telephone Encounter (Signed)
Left a message for Cara with [facility] providing OK for verbal orders for the patient. Advised to give our office a call back if she has any questions or concerns regarding the call.

## 2022-01-30 NOTE — Telephone Encounter (Signed)
Noted  

## 2022-01-30 NOTE — Telephone Encounter (Signed)
Copied from [place] [phone]. Topic: General - Other >> Jan 30, 2022 10:20 AM Chapman Fitch wrote: Reason for CRM: [facility] is waiting on 3 papers related to the orders for pts hospital bed, wheelchair and incontinents supplies / please advise asap so they can start pt order

## 2022-01-30 NOTE — Telephone Encounter (Signed)
Home Health Verbal Orders - Caller/Agency: Moshe Salisbury with [facility] Number: [phone] Requesting OT/Skilled Nursing Frequency: OT evaluation and Skilled Nursing 1x wwk for 4 wks  Please assist a patient further

## 2022-01-30 NOTE — Telephone Encounter (Signed)
  Chief Complaint: Pain 10/10 post sx Symptoms:  Frequency: ongoing Pertinent Negatives: Patient denies  Disposition: '[]'$ ED /'[]'$ Urgent Care (no appt availability in office) / '[]'$ Appointment(In office/virtual)/ '[]'$  [place] [facility]/ '[]'$ Home Care/ '[]'$ Refused Recommended Disposition /'[]'$ [place] [facility]/ '[x]'$  Follow-up with PCP Additional Notes: Received call from RN at [place], Moshe Salisbury. Moshe Salisbury was with pt and wife. PT is experiencing pain of 10/10 in surgical area.  HR is 120 from pain. PT has gone back to ED for same without resolution. Wife spoke with Urologist when F[facility] came out. Wife states she let them know about pain, but nothing was done.   Home health RN will call Surgeon and Urologist. Nurse or pt's wife will c/b if no response.Pt 's wife also wanted to have sooner follow up appt with [name]. PT scheduled for tomorrow.   Home health Rn is requesting an occupational evaluation and  1X4 home health nurse visit orders placed.     Answer Assessment - Initial Assessment Questions 1. LOCATION and RADIATION: "Where is the pain located?"      Incision from Sx 2. QUALITY: "What does the pain feel like?"  (e.g., sharp, dull, aching, burning)      3. SEVERITY: "How bad is the pain?"  (Scale 1-10; or mild, moderate, severe)   - MILD (1-3): doesn't interfere with normal activities    - MODERATE (4-7): interferes with normal activities (e.g., work or school) or awakens from sleep   - SEVERE (8-10): excruciating pain, unable to do any normal activities, difficulty walking     10/10 4. ONSET: "When did the pain start?"     Sx 5. PATTERN: "Does it come and go, or has it been constant since it started?"     constant 6. SCROTAL APPEARANCE: "What does the scrotum look like?" "Is there any swelling or redness?"       7. HERNIA: "Has a doctor ever told you that you have a hernia?"      8. OTHER SYMPTOMS: "Do you have any other symptoms?" (e.g., fever, abdominal pain, vomiting, difficulty  passing urine)  Protocols used: Scrotal Pain-A-AH

## 2022-01-31 ENCOUNTER — Encounter: Payer: Self-pay | Admitting: Nurse Practitioner

## 2022-01-31 ENCOUNTER — Ambulatory Visit: Payer: Medicaid Other | Admitting: Nurse Practitioner

## 2022-01-31 VITALS — BP 88/59 | HR 118 | Temp 98.8°F | Wt 202.0 lb

## 2022-01-31 DIAGNOSIS — E43 Unspecified severe protein-calorie malnutrition: Secondary | ICD-10-CM | POA: Diagnosis not present

## 2022-01-31 DIAGNOSIS — Z9079 Acquired absence of other genital organ(s): Secondary | ICD-10-CM | POA: Diagnosis not present

## 2022-01-31 DIAGNOSIS — R110 Nausea: Secondary | ICD-10-CM | POA: Diagnosis not present

## 2022-01-31 DIAGNOSIS — Z7409 Other reduced mobility: Secondary | ICD-10-CM | POA: Diagnosis not present

## 2022-01-31 DIAGNOSIS — E210 Primary hyperparathyroidism: Secondary | ICD-10-CM | POA: Diagnosis not present

## 2022-01-31 DIAGNOSIS — N179 Acute kidney failure, unspecified: Secondary | ICD-10-CM | POA: Diagnosis not present

## 2022-01-31 DIAGNOSIS — N390 Urinary tract infection, site not specified: Secondary | ICD-10-CM | POA: Diagnosis not present

## 2022-01-31 DIAGNOSIS — R531 Weakness: Secondary | ICD-10-CM | POA: Diagnosis not present

## 2022-01-31 DIAGNOSIS — N365 Urethral false passage: Secondary | ICD-10-CM | POA: Diagnosis not present

## 2022-01-31 DIAGNOSIS — Z8546 Personal history of malignant neoplasm of prostate: Secondary | ICD-10-CM | POA: Diagnosis not present

## 2022-01-31 DIAGNOSIS — Z8673 Personal history of transient ischemic attack (TIA), and cerebral infarction without residual deficits: Secondary | ICD-10-CM | POA: Diagnosis not present

## 2022-01-31 DIAGNOSIS — C61 Malignant neoplasm of prostate: Secondary | ICD-10-CM

## 2022-01-31 DIAGNOSIS — Z6825 Body mass index (BMI) 25.0-25.9, adult: Secondary | ICD-10-CM | POA: Diagnosis not present

## 2022-01-31 DIAGNOSIS — B9689 Other specified bacterial agents as the cause of diseases classified elsewhere: Secondary | ICD-10-CM | POA: Diagnosis not present

## 2022-01-31 DIAGNOSIS — M79604 Pain in right leg: Secondary | ICD-10-CM | POA: Diagnosis not present

## 2022-01-31 DIAGNOSIS — R9341 Abnormal radiologic findings on diagnostic imaging of renal pelvis, ureter, or bladder: Secondary | ICD-10-CM | POA: Diagnosis not present

## 2022-01-31 DIAGNOSIS — Z978 Presence of other specified devices: Secondary | ICD-10-CM | POA: Insufficient documentation

## 2022-01-31 DIAGNOSIS — F17210 Nicotine dependence, cigarettes, uncomplicated: Secondary | ICD-10-CM | POA: Diagnosis not present

## 2022-01-31 DIAGNOSIS — R4182 Altered mental status, unspecified: Secondary | ICD-10-CM | POA: Diagnosis not present

## 2022-01-31 DIAGNOSIS — M79605 Pain in left leg: Secondary | ICD-10-CM | POA: Diagnosis not present

## 2022-01-31 DIAGNOSIS — E785 Hyperlipidemia, unspecified: Secondary | ICD-10-CM | POA: Diagnosis not present

## 2022-01-31 DIAGNOSIS — I129 Hypertensive chronic kidney disease with stage 1 through stage 4 chronic kidney disease, or unspecified chronic kidney disease: Secondary | ICD-10-CM | POA: Diagnosis not present

## 2022-01-31 DIAGNOSIS — T83511A Infection and inflammatory reaction due to indwelling urethral catheter, initial encounter: Secondary | ICD-10-CM | POA: Diagnosis not present

## 2022-01-31 DIAGNOSIS — B965 Pseudomonas (aeruginosa) (mallei) (pseudomallei) as the cause of diseases classified elsewhere: Secondary | ICD-10-CM | POA: Diagnosis not present

## 2022-01-31 DIAGNOSIS — F05 Delirium due to known physiological condition: Secondary | ICD-10-CM | POA: Diagnosis not present

## 2022-01-31 DIAGNOSIS — N1831 Chronic kidney disease, stage 3a: Secondary | ICD-10-CM | POA: Diagnosis not present

## 2022-01-31 DIAGNOSIS — G928 Other toxic encephalopathy: Secondary | ICD-10-CM | POA: Diagnosis not present

## 2022-01-31 DIAGNOSIS — R509 Fever, unspecified: Secondary | ICD-10-CM | POA: Diagnosis not present

## 2022-01-31 DIAGNOSIS — M109 Gout, unspecified: Secondary | ICD-10-CM | POA: Diagnosis not present

## 2022-01-31 DIAGNOSIS — E860 Dehydration: Secondary | ICD-10-CM | POA: Diagnosis not present

## 2022-01-31 DIAGNOSIS — R5383 Other fatigue: Secondary | ICD-10-CM | POA: Diagnosis not present

## 2022-01-31 NOTE — Progress Notes (Signed)
BP (!) 88/59   Pulse (!) 118   Temp 98.8 F (37.1 C) (Oral)   Wt 202 lb (91.6 kg)   BMI 25.60 kg/m    Subjective:    Patient ID: Todd Craig, male    DOB: 03-21-1961, 61 y.o.   MRN: [id number]  HPI: Todd Craig is a 61 y.o. male  Chief Complaint  Patient presents with   Pain    Patient is here for Pain. Patient says he is still having pain where his catheters were located. Patient says it starting to interfere with his walking. Patient significant other says she would like to provider to take a look at the area as it is filthy and possibly has a UTI. Patient significant other says patient is dehydrate and says patient needs fluids. She says patient is talking off the wall and speaking on things from the past.    Wife present at bedside with patient.    URINARY SYMPTOMS Had prostatectomy on 11/28/21, subsequently required foley catheter for weeks and has ongoing pain.  Followed by [facility] urology and infectious disease.   Currently has home health coming in -- nurse came in yesterday and was good, CNA that came in Monday was not helpful per wife.  Wife reports catheter is "yucky" was given an extra bag, but has not been able to attach.  Continues to be in a lot of pain from catheter site and in between legs + lower abdomen.  Was to have catheter 2-3 weeks, has had in since surgery and when in rehab, wife reports she is unsure why this is still in.    Is in need of hospital bed at this time + wheelchair and incontinent pads short term due to ongoing pain post-op and difficulty with mobility, unable to get upstairs to bedroom and needs W/C to get to visits until is improved in current status.  He is alert and has adequate upper body strength, is able to adjust hospital bed independently and move wheelchair manually.  Recent visit, 01/22/22, with infectious disease at [facility] for MRSA and Klebsiella after prostatectomy and foley catheter placement -- at visit on review he was tachycardic and in pain,  CBC was obtained -- plan was for CT A/P, CBC + they planned to speak to urology and expedite appointment there.  They did not start abx as recently came off them.  Urology has not called to follow-up to get him seen sooner.  CBC did show elevation in WBC recently.  Wife reports he is starting to talk delirious and confused.  She is concerned he is dehydrated and infection presenting again.  Wife endorses frustration with [facility] as not contacting her or responding to her and her concerns.   Dysuria: burning Urinary frequency: yes Urgency: yes Small volume voids: no Symptom severity: no Urinary incontinence: no Foul odor: yes Hematuria: no Abdominal pain: no Back pain: no Suprapubic pain/pressure: yes Flank pain: no Fever:  no Vomiting: no Status: worse Previous urinary tract infection: yes Recurrent urinary tract infection: yes Sexual activity: No sexually active/monogomous/practicing safe sex Treatments attempted: antibiotics and increasing fluids    Relevant past medical, surgical, family and social history reviewed and updated as indicated. Interim medical history since our last visit reviewed. Allergies and medications reviewed and updated.  Review of Systems  Constitutional:  Positive for appetite change, chills and unexpected weight change. Negative for activity change, diaphoresis, fatigue and fever.  Respiratory:  Negative for cough, chest tightness, shortness of breath and wheezing.  Cardiovascular:  Negative for chest pain, palpitations and leg swelling.  Gastrointestinal:  Positive for abdominal pain and nausea. Negative for abdominal distention, constipation, diarrhea and vomiting.  Endocrine: Negative for cold intolerance, heat intolerance, polydipsia, polyphagia and polyuria.  Genitourinary:  Positive for dysuria, frequency, penile pain and urgency. Negative for decreased urine volume, difficulty urinating, flank pain and hematuria.  Musculoskeletal:  Positive for  arthralgias.  Skin: Negative.   Neurological: Negative.   Psychiatric/Behavioral:  Positive for confusion.     Per HPI unless specifically indicated above     Objective:    BP (!) 88/59   Pulse (!) 118   Temp 98.8 F (37.1 C) (Oral)   Wt 202 lb (91.6 kg)   BMI 25.60 kg/m   Wt Readings from Last 3 Encounters:  01/31/22 202 lb (91.6 kg)  01/18/22 202 lb (91.6 kg)  10/24/21 224 lb 8 oz (101.8 kg)    Physical Exam Vitals and nursing note reviewed.  Constitutional:      General: He is awake. He is not in acute distress.    Appearance: He is well-developed and well-groomed. He is ill-appearing. He is not toxic-appearing.     Comments: In significant discomfort, frequently moving in wheelchair and grimacing/groaning.    HENT:     Head: Normocephalic and atraumatic.     Right Ear: Hearing normal. No drainage.     Left Ear: Hearing normal. No drainage.     Mouth/Throat:     Pharynx: Uvula midline.  Eyes:     General: Lids are normal.        Right eye: No discharge.        Left eye: No discharge.     Conjunctiva/sclera: Conjunctivae normal.     Pupils: Pupils are equal, round, and reactive to light.  Neck:     Thyroid: No thyromegaly.     Vascular: No carotid bruit.  Cardiovascular:     Rate and Rhythm: Regular rhythm. Tachycardia present.     Heart sounds: Normal heart sounds, S1 normal and S2 normal. No murmur heard.    No gallop.  Pulmonary:     Effort: Pulmonary effort is normal. No accessory muscle usage or respiratory distress.     Breath sounds: Normal breath sounds.  Abdominal:     General: Bowel sounds are normal. There is no distension.     Palpations: Abdomen is soft.     Tenderness: There is abdominal tenderness in the suprapubic area. There is no right CVA tenderness or left CVA tenderness.  Musculoskeletal:        General: Normal range of motion.     Cervical back: Normal range of motion and neck supple.     Right lower leg: No edema.     Left lower leg:  No edema.     Comments: Strength BUE 5/5 and BLE 3/5  Skin:    General: Skin is warm and dry.     Capillary Refill: Capillary refill takes less than 2 seconds.     Findings: No rash.  Neurological:     Mental Status: He is alert and oriented to person, place, and time.     Deep Tendon Reflexes: Reflexes are normal and symmetric.     Reflex Scores:      Brachioradialis reflexes are 2+ on the right side and 2+ on the left side.      Patellar reflexes are 2+ on the right side and 2+ on the left side. Psychiatric:  Attention and Perception: Attention normal.        Mood and Affect: Mood normal.        Speech: Speech normal.        Behavior: Behavior normal. Behavior is cooperative.        Thought Content: Thought content normal.     Comments: Frequently falling asleep in wheelchair, then wakes back up due to pain.  Slightly lethargic.    Results for orders placed or performed in visit on 01/18/22  CBC w/Diff  Result Value Ref Range   WBC 6.0 3.4 - 10.8 x10E3/uL   RBC 4.13 (L) 4.14 - 5.80 x10E6/uL   Hemoglobin 13.2 13.0 - 17.7 g/dL   Hematocrit 37.8 37.5 - 51.0 %   MCV 92 79 - 97 fL   MCH 32.0 26.6 - 33.0 pg   MCHC 34.9 31.5 - 35.7 g/dL   RDW 12.7 11.6 - 15.4 %   Platelets 259 150 - 450 x10E3/uL   Neutrophils 49 Not Estab. %   Lymphs 39 Not Estab. %   Monocytes 8 Not Estab. %   Eos 3 Not Estab. %   Basos 1 Not Estab. %   Neutrophils Absolute 3.0 1.4 - 7.0 x10E3/uL   Lymphocytes Absolute 2.3 0.7 - 3.1 x10E3/uL   Monocytes Absolute 0.5 0.1 - 0.9 x10E3/uL   EOS (ABSOLUTE) 0.2 0.0 - 0.4 x10E3/uL   Basophils Absolute 0.0 0.0 - 0.2 x10E3/uL   Immature Granulocytes 0 Not Estab. %   Immature Grans (Abs) 0.0 0.0 - 0.1 x10E3/uL  Comp Met (CMET)  Result Value Ref Range   Glucose 100 (H) 70 - 99 mg/dL   BUN 9 8 - 27 mg/dL   Creatinine, Ser 0.76 0.76 - 1.27 mg/dL   eGFR 103 >59 mL/min/1.73   BUN/Creatinine Ratio 12 10 - 24   Sodium 136 134 - 144 mmol/L   Potassium 4.3 3.5 -  5.2 mmol/L   Chloride 98 96 - 106 mmol/L   CO2 21 20 - 29 mmol/L   Calcium 9.8 8.6 - 10.2 mg/dL   Total Protein 7.1 6.0 - 8.5 g/dL   Albumin 4.5 3.8 - 4.9 g/dL   Globulin, Total 2.6 1.5 - 4.5 g/dL   Albumin/Globulin Ratio 1.7 1.2 - 2.2   Bilirubin Total 0.3 0.0 - 1.2 mg/dL   Alkaline Phosphatase 54 44 - 121 IU/L   AST 17 0 - 40 IU/L   ALT 13 0 - 44 IU/L      Assessment & Plan:   Problem List Items Addressed This Visit       Genitourinary   Prostate cancer ([place]) - Primary    Performed 11/28/21 with subsequent foley catheter placed, per wife was supposed to be in for 2-3 weeks but have not been able to schedule with urology sooner and they have not reached out to her -- last visit with infectious disease they were going to try to expedite visit with urology.  At this time he is in significant pain and wife reports some delirium at home and confusion + BP on lower side and HR elevated -- concern for sepsis -- have advised they immediately go to [facility] ER for assessment and possible admission + may be able to speak to urology while there.  Wife agreeable to this plan and if leaving now to take patient there.      Relevant Orders   For home use only DME Hospital bed   DME Wheelchair manual   For home  use only DME Other see comment     Other   Foley catheter in place    Foley catheter in place.  At this time he is in significant pain and wife reports some delirium at home and confusion + BP on lower side and HR elevated -- concern for sepsis -- have advised they immediately go to [facility] ER for assessment and possible admission + may be able to speak to urology while there.  Wife agreeable to this plan and if leaving now to take patient there.      H/O abdominal prostatectomy    Foley catheter in place.  At this time he is in significant pain and wife reports some delirium at home and confusion + BP on lower side and HR elevated -- concern for sepsis -- have advised  they immediately go to [facility] ER for assessment and possible admission + may be able to speak to urology while there.  Wife agreeable to this plan and if leaving now to take patient there.      Relevant Orders   For home use only DME Hospital bed   DME Wheelchair manual   For home use only DME Other see comment   Poor mobility    Due to recent prostatectomy and foley catheter -- 11/28/21.  At this time will order hospital bed, manual wheelchair, and incontinent pads for short term home use until patient is improved in status as currently unable to walk long distances or get upstairs to bedroom.      Relevant Orders   For home use only DME Hospital bed   DME Wheelchair manual   For home use only DME Other see comment     Follow up plan: Return for after ER visit.

## 2022-01-31 NOTE — Patient Instructions (Signed)
Urinary Tract Infection, Adult A urinary tract infection (UTI) is an infection of any part of the urinary tract. The urinary tract includes: The kidneys. The ureters. The bladder. The urethra. These organs make, store, and get rid of pee (urine) in the body. What are the causes? This infection is caused by germs (bacteria) in your genital area. These germs grow and cause swelling (inflammation) of your urinary tract. What increases the risk? The following factors may make you more likely to develop this condition: Using a small, thin tube (catheter) to drain pee. Not being able to control when you pee or poop (incontinence). Being male. If you are male, these things can increase the risk: Using these methods to prevent pregnancy: A medicine that kills sperm (spermicide). A device that blocks sperm (diaphragm). Having low levels of a male hormone (estrogen). Being pregnant. You are more likely to develop this condition if: You have genes that add to your risk. You are sexually active. You take antibiotic medicines. You have trouble peeing because of: A prostate that is bigger than normal, if you are male. A blockage in the part of your body that drains pee from the bladder. A kidney stone. A nerve condition that affects your bladder. Not getting enough to drink. Not peeing often enough. You have other conditions, such as: Diabetes. A weak disease-fighting system (immune system). Sickle cell disease. Gout. Injury of the spine. What are the signs or symptoms? Symptoms of this condition include: Needing to pee right away. Peeing small amounts often. Pain or burning when peeing. Blood in the pee. Pee that smells bad or not like normal. Trouble peeing. Pee that is cloudy. Fluid coming from the vagina, if you are male. Pain in the belly or lower back. Other symptoms include: Vomiting. Not feeling hungry. Feeling mixed up (confused). This may be the first symptom in  older adults. Being tired and grouchy (irritable). A fever. Watery poop (diarrhea). How is this treated? Taking antibiotic medicine. Taking other medicines. Drinking enough water. In some cases, you may need to see a specialist. Follow these instructions at home:  Medicines Take over-the-counter and prescription medicines only as told by your doctor. If you were prescribed an antibiotic medicine, take it as told by your doctor. Do not stop taking it even if you start to feel better. General instructions Make sure you: Pee until your bladder is empty. Do not hold pee for a long time. Empty your bladder after sex. Wipe from front to back after peeing or pooping if you are a male. Use each tissue one time when you wipe. Drink enough fluid to keep your pee pale yellow. Keep all follow-up visits. Contact a doctor if: You do not get better after 1-2 days. Your symptoms go away and then come back. Get help right away if: You have very bad back pain. You have very bad pain in your lower belly. You have a fever. You have chills. You feeling like you will vomit or you vomit. Summary A urinary tract infection (UTI) is an infection of any part of the urinary tract. This condition is caused by germs in your genital area. There are many risk factors for a UTI. Treatment includes antibiotic medicines. Drink enough fluid to keep your pee pale yellow. This information is not intended to replace advice given to you by your health care provider. Make sure you discuss any questions you have with your health care provider. Document Revised: 11/13/2019 Document Reviewed: 11/13/2019 Elsevier Patient Education    2023 Elsevier Inc.  

## 2022-01-31 NOTE — Assessment & Plan Note (Signed)
Performed 11/28/21 with subsequent foley catheter placed, per wife was supposed to be in for 2-3 weeks but have not been able to schedule with urology sooner and they have not reached out to her -- last visit with infectious disease they were going to try to expedite visit with urology.  At this time he is in significant pain and wife reports some delirium at home and confusion + BP on lower side and HR elevated -- concern for sepsis -- have advised they immediately go to [facility] ER for assessment and possible admission + may be able to speak to urology while there.  Wife agreeable to this plan and if leaving now to take patient there.

## 2022-01-31 NOTE — Assessment & Plan Note (Signed)
Foley catheter in place.  At this time he is in significant pain and wife reports some delirium at home and confusion + BP on lower side and HR elevated -- concern for sepsis -- have advised they immediately go to [facility] ER for assessment and possible admission + may be able to speak to urology while there.  Wife agreeable to this plan and if leaving now to take patient there.

## 2022-01-31 NOTE — Assessment & Plan Note (Signed)
Due to recent prostatectomy and foley catheter -- 11/28/21.  At this time will order hospital bed, manual wheelchair, and incontinent pads for short term home use until patient is improved in status as currently unable to walk long distances or get upstairs to bedroom.

## 2022-01-31 NOTE — Assessment & Plan Note (Signed)
Foley catheter in place.  At this time he is in significant pain and wife reports some delirium at home and confusion + BP on lower side and HR elevated -- concern for sepsis -- have advised they immediately go to [facility] ER for assessment and possible admission + may be able to speak to urology while there.  Wife agreeable to this plan and if leaving now to take patient there.

## 2022-02-01 DIAGNOSIS — R9431 Abnormal electrocardiogram [ECG] [EKG]: Secondary | ICD-10-CM | POA: Diagnosis not present

## 2022-02-01 DIAGNOSIS — N390 Urinary tract infection, site not specified: Secondary | ICD-10-CM | POA: Diagnosis not present

## 2022-02-01 DIAGNOSIS — R9389 Abnormal findings on diagnostic imaging of other specified body structures: Secondary | ICD-10-CM | POA: Diagnosis not present

## 2022-02-01 DIAGNOSIS — N365 Urethral false passage: Secondary | ICD-10-CM | POA: Diagnosis not present

## 2022-02-01 DIAGNOSIS — N189 Chronic kidney disease, unspecified: Secondary | ICD-10-CM | POA: Diagnosis not present

## 2022-02-01 DIAGNOSIS — N179 Acute kidney failure, unspecified: Secondary | ICD-10-CM | POA: Diagnosis not present

## 2022-02-01 DIAGNOSIS — T83028A Displacement of other indwelling urethral catheter, initial encounter: Secondary | ICD-10-CM | POA: Diagnosis not present

## 2022-02-01 DIAGNOSIS — E210 Primary hyperparathyroidism: Secondary | ICD-10-CM | POA: Diagnosis not present

## 2022-02-01 DIAGNOSIS — Z9079 Acquired absence of other genital organ(s): Secondary | ICD-10-CM | POA: Diagnosis not present

## 2022-02-01 DIAGNOSIS — G9340 Encephalopathy, unspecified: Secondary | ICD-10-CM | POA: Diagnosis not present

## 2022-02-01 DIAGNOSIS — C61 Malignant neoplasm of prostate: Secondary | ICD-10-CM | POA: Diagnosis not present

## 2022-02-02 DIAGNOSIS — N179 Acute kidney failure, unspecified: Secondary | ICD-10-CM | POA: Diagnosis not present

## 2022-02-02 DIAGNOSIS — E210 Primary hyperparathyroidism: Secondary | ICD-10-CM | POA: Diagnosis not present

## 2022-02-02 DIAGNOSIS — N390 Urinary tract infection, site not specified: Secondary | ICD-10-CM | POA: Diagnosis not present

## 2022-02-02 DIAGNOSIS — N189 Chronic kidney disease, unspecified: Secondary | ICD-10-CM | POA: Diagnosis not present

## 2022-02-02 DIAGNOSIS — C61 Malignant neoplasm of prostate: Secondary | ICD-10-CM | POA: Diagnosis not present

## 2022-02-02 DIAGNOSIS — G9340 Encephalopathy, unspecified: Secondary | ICD-10-CM | POA: Diagnosis not present

## 2022-02-03 DIAGNOSIS — R946 Abnormal results of thyroid function studies: Secondary | ICD-10-CM | POA: Diagnosis not present

## 2022-02-03 DIAGNOSIS — N179 Acute kidney failure, unspecified: Secondary | ICD-10-CM | POA: Diagnosis not present

## 2022-02-03 DIAGNOSIS — N189 Chronic kidney disease, unspecified: Secondary | ICD-10-CM | POA: Diagnosis not present

## 2022-02-03 DIAGNOSIS — N390 Urinary tract infection, site not specified: Secondary | ICD-10-CM | POA: Diagnosis not present

## 2022-02-03 DIAGNOSIS — G9340 Encephalopathy, unspecified: Secondary | ICD-10-CM | POA: Diagnosis not present

## 2022-02-03 DIAGNOSIS — E210 Primary hyperparathyroidism: Secondary | ICD-10-CM | POA: Diagnosis not present

## 2022-02-03 DIAGNOSIS — N183 Chronic kidney disease, stage 3 unspecified: Secondary | ICD-10-CM | POA: Diagnosis not present

## 2022-02-03 DIAGNOSIS — Z8546 Personal history of malignant neoplasm of prostate: Secondary | ICD-10-CM | POA: Diagnosis not present

## 2022-02-04 DIAGNOSIS — N1831 Chronic kidney disease, stage 3a: Secondary | ICD-10-CM | POA: Diagnosis not present

## 2022-02-04 DIAGNOSIS — E210 Primary hyperparathyroidism: Secondary | ICD-10-CM | POA: Diagnosis not present

## 2022-02-04 DIAGNOSIS — E042 Nontoxic multinodular goiter: Secondary | ICD-10-CM | POA: Diagnosis not present

## 2022-02-04 DIAGNOSIS — N189 Chronic kidney disease, unspecified: Secondary | ICD-10-CM | POA: Diagnosis not present

## 2022-02-04 DIAGNOSIS — R946 Abnormal results of thyroid function studies: Secondary | ICD-10-CM | POA: Diagnosis not present

## 2022-02-04 DIAGNOSIS — N179 Acute kidney failure, unspecified: Secondary | ICD-10-CM | POA: Diagnosis not present

## 2022-02-04 DIAGNOSIS — N390 Urinary tract infection, site not specified: Secondary | ICD-10-CM | POA: Diagnosis not present

## 2022-02-04 DIAGNOSIS — G9340 Encephalopathy, unspecified: Secondary | ICD-10-CM | POA: Diagnosis not present

## 2022-02-04 DIAGNOSIS — Z8546 Personal history of malignant neoplasm of prostate: Secondary | ICD-10-CM | POA: Diagnosis not present

## 2022-02-05 ENCOUNTER — Ambulatory Visit: Payer: Medicaid Other | Admitting: Nurse Practitioner

## 2022-02-05 DIAGNOSIS — Z8546 Personal history of malignant neoplasm of prostate: Secondary | ICD-10-CM | POA: Diagnosis not present

## 2022-02-05 DIAGNOSIS — B961 Klebsiella pneumoniae [K. pneumoniae] as the cause of diseases classified elsewhere: Secondary | ICD-10-CM | POA: Diagnosis not present

## 2022-02-05 DIAGNOSIS — N390 Urinary tract infection, site not specified: Secondary | ICD-10-CM | POA: Diagnosis not present

## 2022-02-05 DIAGNOSIS — N189 Chronic kidney disease, unspecified: Secondary | ICD-10-CM | POA: Diagnosis not present

## 2022-02-05 DIAGNOSIS — C61 Malignant neoplasm of prostate: Secondary | ICD-10-CM | POA: Diagnosis not present

## 2022-02-05 DIAGNOSIS — R946 Abnormal results of thyroid function studies: Secondary | ICD-10-CM | POA: Diagnosis not present

## 2022-02-05 DIAGNOSIS — N179 Acute kidney failure, unspecified: Secondary | ICD-10-CM | POA: Diagnosis not present

## 2022-02-05 DIAGNOSIS — R7881 Bacteremia: Secondary | ICD-10-CM | POA: Diagnosis not present

## 2022-02-05 DIAGNOSIS — N1831 Chronic kidney disease, stage 3a: Secondary | ICD-10-CM | POA: Diagnosis not present

## 2022-02-06 DIAGNOSIS — B965 Pseudomonas (aeruginosa) (mallei) (pseudomallei) as the cause of diseases classified elsewhere: Secondary | ICD-10-CM | POA: Diagnosis not present

## 2022-02-06 DIAGNOSIS — N390 Urinary tract infection, site not specified: Secondary | ICD-10-CM | POA: Diagnosis not present

## 2022-02-06 DIAGNOSIS — I10 Essential (primary) hypertension: Secondary | ICD-10-CM | POA: Diagnosis not present

## 2022-02-06 DIAGNOSIS — N1831 Chronic kidney disease, stage 3a: Secondary | ICD-10-CM | POA: Diagnosis not present

## 2022-02-06 DIAGNOSIS — Z8546 Personal history of malignant neoplasm of prostate: Secondary | ICD-10-CM | POA: Diagnosis not present

## 2022-02-06 DIAGNOSIS — R946 Abnormal results of thyroid function studies: Secondary | ICD-10-CM | POA: Diagnosis not present

## 2022-02-06 DIAGNOSIS — C61 Malignant neoplasm of prostate: Secondary | ICD-10-CM | POA: Diagnosis not present

## 2022-02-06 NOTE — Telephone Encounter (Signed)
Paperwork completed and faxed back to [employer].

## 2022-02-06 NOTE — Telephone Encounter (Signed)
Copied from [place] [id number]. Topic: General - Other >> Feb 06, 2022 12:27 PM Ja-Kwan M wrote: Reason for CRM: Daphane Shepherd stated they need the papers signed by [facility] and returned asap because the pt will be discharging today and doesl not have any supplies. [name] stated pt needs a wheelchair because she will not have any help getting pt in to the home. Cb# [phone]

## 2022-02-07 ENCOUNTER — Telehealth: Payer: Self-pay | Admitting: Nurse Practitioner

## 2022-02-07 DIAGNOSIS — Z7409 Other reduced mobility: Secondary | ICD-10-CM

## 2022-02-07 DIAGNOSIS — N183 Chronic kidney disease, stage 3 unspecified: Secondary | ICD-10-CM | POA: Diagnosis not present

## 2022-02-07 DIAGNOSIS — B9561 Methicillin susceptible Staphylococcus aureus infection as the cause of diseases classified elsewhere: Secondary | ICD-10-CM | POA: Diagnosis not present

## 2022-02-07 DIAGNOSIS — G9340 Encephalopathy, unspecified: Secondary | ICD-10-CM | POA: Diagnosis not present

## 2022-02-07 DIAGNOSIS — C61 Malignant neoplasm of prostate: Secondary | ICD-10-CM | POA: Diagnosis not present

## 2022-02-07 DIAGNOSIS — N390 Urinary tract infection, site not specified: Secondary | ICD-10-CM | POA: Diagnosis not present

## 2022-02-07 DIAGNOSIS — N179 Acute kidney failure, unspecified: Secondary | ICD-10-CM | POA: Diagnosis not present

## 2022-02-07 NOTE — Telephone Encounter (Signed)
Todd Craig is calling in for assistance. She states that pt is being discharged from hospital and still needs his Rollator chair/walker. Todd Craig states that they received shower chair but not walker/chair. Pt has 2 stairs in home. Todd Craig states the walker/chair would be helpful because she is unable to hold him up.   [phone]- Todd Craig

## 2022-02-07 NOTE — Telephone Encounter (Signed)
Copied from [place] [phone]. Topic: General - Other >> Feb 07, 2022 11:16 AM Cyndi Bender wrote: Reason for CRM: Edmonia James with [employer] requests that Destiny return her call at [phone]

## 2022-02-07 NOTE — Telephone Encounter (Signed)
Left message for Diane to give our office a call back to gather more information in regards to our call.    OK for [facility] to gather more information from caller if she calls back.

## 2022-02-07 NOTE — Telephone Encounter (Signed)
Diane is looking to get a walker for the patient, they did receive the wheelchair from the initial request. She wants to know if clinic can fax over order for DME Walker.

## 2022-02-08 DIAGNOSIS — C61 Malignant neoplasm of prostate: Secondary | ICD-10-CM | POA: Diagnosis not present

## 2022-02-08 NOTE — Telephone Encounter (Signed)
Todd Craig states the wrong walker was ordered.  Pt needs a Rolator walker. 2.   Pt must have extensions for bed, as he is 6 ft 4 in.  [facility] needs an order faxed.  If you can please send asap, as the rep is scheduled to come to pt today anyway.

## 2022-02-08 NOTE — Telephone Encounter (Signed)
Spoke with Todd Craig and made her aware that the requested orders have been faxed over to [facility] at [phone]. Kallie Edward to give their facility a call to follow up and if she needs anything regarding the patient to let our office know. Todd Craig verbalized understanding and has no further questions at this time.

## 2022-02-08 NOTE — Telephone Encounter (Signed)
Patient's friend Todd Craig, called in states pt needs rolator walker and extension for his bed, because its too short. Please call back

## 2022-02-09 DIAGNOSIS — C61 Malignant neoplasm of prostate: Secondary | ICD-10-CM | POA: Diagnosis not present

## 2022-02-09 NOTE — Addendum Note (Signed)
Addended by: Irena Reichmann on: 02/09/2022 10:51 AM   Modules accepted: Orders

## 2022-02-09 NOTE — Addendum Note (Signed)
Addended by: Marnee Guarneri T on: 02/09/2022 12:22 PM   Modules accepted: Orders

## 2022-02-09 NOTE — Telephone Encounter (Signed)
Patients, caregiver [name], called back again about status of rolatorwalker and bed extension, really stressed about bed extension. Please call back

## 2022-02-09 NOTE — Telephone Encounter (Signed)
Spoke with Todd Craig in regards to patient orders and notified her of [name]'s recommendations. Patient verbalized they were able to get the patient's wheelchair and have yet to hear about the rotator walker and bed extension. Todd Craig advised to give our office call back if she has any other questions or concerns.

## 2022-02-12 DIAGNOSIS — F17210 Nicotine dependence, cigarettes, uncomplicated: Secondary | ICD-10-CM | POA: Diagnosis not present

## 2022-02-12 DIAGNOSIS — N1831 Chronic kidney disease, stage 3a: Secondary | ICD-10-CM | POA: Diagnosis not present

## 2022-02-12 DIAGNOSIS — Z6829 Body mass index (BMI) 29.0-29.9, adult: Secondary | ICD-10-CM | POA: Diagnosis not present

## 2022-02-12 DIAGNOSIS — E213 Hyperparathyroidism, unspecified: Secondary | ICD-10-CM | POA: Diagnosis not present

## 2022-02-12 DIAGNOSIS — Z8673 Personal history of transient ischemic attack (TIA), and cerebral infarction without residual deficits: Secondary | ICD-10-CM | POA: Diagnosis not present

## 2022-02-12 DIAGNOSIS — C61 Malignant neoplasm of prostate: Secondary | ICD-10-CM | POA: Diagnosis not present

## 2022-02-12 DIAGNOSIS — I129 Hypertensive chronic kidney disease with stage 1 through stage 4 chronic kidney disease, or unspecified chronic kidney disease: Secondary | ICD-10-CM | POA: Diagnosis not present

## 2022-02-12 DIAGNOSIS — B965 Pseudomonas (aeruginosa) (mallei) (pseudomallei) as the cause of diseases classified elsewhere: Secondary | ICD-10-CM | POA: Diagnosis not present

## 2022-02-12 DIAGNOSIS — E7800 Pure hypercholesterolemia, unspecified: Secondary | ICD-10-CM | POA: Diagnosis not present

## 2022-02-12 DIAGNOSIS — K219 Gastro-esophageal reflux disease without esophagitis: Secondary | ICD-10-CM | POA: Diagnosis not present

## 2022-02-12 DIAGNOSIS — M109 Gout, unspecified: Secondary | ICD-10-CM | POA: Diagnosis not present

## 2022-02-12 DIAGNOSIS — D631 Anemia in chronic kidney disease: Secondary | ICD-10-CM | POA: Diagnosis not present

## 2022-02-12 DIAGNOSIS — T83511D Infection and inflammatory reaction due to indwelling urethral catheter, subsequent encounter: Secondary | ICD-10-CM | POA: Diagnosis not present

## 2022-02-12 DIAGNOSIS — E663 Overweight: Secondary | ICD-10-CM | POA: Diagnosis not present

## 2022-02-12 DIAGNOSIS — E559 Vitamin D deficiency, unspecified: Secondary | ICD-10-CM | POA: Diagnosis not present

## 2022-02-12 DIAGNOSIS — N390 Urinary tract infection, site not specified: Secondary | ICD-10-CM | POA: Diagnosis not present

## 2022-02-12 DIAGNOSIS — Z9181 History of falling: Secondary | ICD-10-CM | POA: Diagnosis not present

## 2022-02-13 ENCOUNTER — Telehealth: Payer: Self-pay | Admitting: Nurse Practitioner

## 2022-02-13 DIAGNOSIS — C61 Malignant neoplasm of prostate: Secondary | ICD-10-CM | POA: Diagnosis not present

## 2022-02-13 NOTE — Telephone Encounter (Signed)
Home Health Verbal Orders - Caller/Agency: [facility] Number: [phone] Requesting OT/PT/Skilled Nursing/Social Work/Speech Therapy: Nurse  1w3 1 every other 6 weeks  PT and OT may change:  1w3 1 every other 6 weeks

## 2022-02-14 DIAGNOSIS — Z9181 History of falling: Secondary | ICD-10-CM | POA: Diagnosis not present

## 2022-02-14 DIAGNOSIS — D631 Anemia in chronic kidney disease: Secondary | ICD-10-CM | POA: Diagnosis not present

## 2022-02-14 DIAGNOSIS — E7800 Pure hypercholesterolemia, unspecified: Secondary | ICD-10-CM | POA: Diagnosis not present

## 2022-02-14 DIAGNOSIS — Z8673 Personal history of transient ischemic attack (TIA), and cerebral infarction without residual deficits: Secondary | ICD-10-CM | POA: Diagnosis not present

## 2022-02-14 DIAGNOSIS — T83511D Infection and inflammatory reaction due to indwelling urethral catheter, subsequent encounter: Secondary | ICD-10-CM | POA: Diagnosis not present

## 2022-02-14 DIAGNOSIS — E663 Overweight: Secondary | ICD-10-CM | POA: Diagnosis not present

## 2022-02-14 DIAGNOSIS — F17210 Nicotine dependence, cigarettes, uncomplicated: Secondary | ICD-10-CM | POA: Diagnosis not present

## 2022-02-14 DIAGNOSIS — N390 Urinary tract infection, site not specified: Secondary | ICD-10-CM | POA: Diagnosis not present

## 2022-02-14 DIAGNOSIS — M109 Gout, unspecified: Secondary | ICD-10-CM | POA: Diagnosis not present

## 2022-02-14 DIAGNOSIS — K219 Gastro-esophageal reflux disease without esophagitis: Secondary | ICD-10-CM | POA: Diagnosis not present

## 2022-02-14 DIAGNOSIS — C61 Malignant neoplasm of prostate: Secondary | ICD-10-CM | POA: Diagnosis not present

## 2022-02-14 DIAGNOSIS — E213 Hyperparathyroidism, unspecified: Secondary | ICD-10-CM | POA: Diagnosis not present

## 2022-02-14 DIAGNOSIS — Z6829 Body mass index (BMI) 29.0-29.9, adult: Secondary | ICD-10-CM | POA: Diagnosis not present

## 2022-02-14 DIAGNOSIS — B965 Pseudomonas (aeruginosa) (mallei) (pseudomallei) as the cause of diseases classified elsewhere: Secondary | ICD-10-CM | POA: Diagnosis not present

## 2022-02-14 DIAGNOSIS — E559 Vitamin D deficiency, unspecified: Secondary | ICD-10-CM | POA: Diagnosis not present

## 2022-02-14 DIAGNOSIS — N1831 Chronic kidney disease, stage 3a: Secondary | ICD-10-CM | POA: Diagnosis not present

## 2022-02-14 DIAGNOSIS — I129 Hypertensive chronic kidney disease with stage 1 through stage 4 chronic kidney disease, or unspecified chronic kidney disease: Secondary | ICD-10-CM | POA: Diagnosis not present

## 2022-02-14 NOTE — Telephone Encounter (Signed)
Spoke with Ighos from [facility] to provide with verbal OK for patient. Ighos verbalized understanding and has no further questions at this time.

## 2022-02-15 DIAGNOSIS — C61 Malignant neoplasm of prostate: Secondary | ICD-10-CM | POA: Diagnosis not present

## 2022-02-15 NOTE — Telephone Encounter (Signed)
Left message for Joey from [facility] providing verbal OK for patient per Marnee Guarneri, NP. Advised to give our office a call back if any questions.

## 2022-02-15 NOTE — Telephone Encounter (Signed)
Copied from [place] [phone]. Topic: General - Other >> Feb 15, 2022 11:13 AM Tiffany B wrote: Requesting verbal orders for PT 1x 3, 1 week every 2x 4, working on endurance and gait training. Okay to leave a VM on the secure line,

## 2022-02-16 ENCOUNTER — Telehealth: Payer: Self-pay | Admitting: Nurse Practitioner

## 2022-02-16 DIAGNOSIS — Z4889 Encounter for other specified surgical aftercare: Secondary | ICD-10-CM | POA: Diagnosis not present

## 2022-02-16 DIAGNOSIS — C61 Malignant neoplasm of prostate: Secondary | ICD-10-CM | POA: Diagnosis not present

## 2022-02-16 NOTE — Telephone Encounter (Signed)
Shireen with [facility] just wanted to let the provider know therapy was being pushed back to next week.

## 2022-02-19 ENCOUNTER — Other Ambulatory Visit: Payer: Self-pay

## 2022-02-19 ENCOUNTER — Emergency Department
Admission: EM | Admit: 2022-02-19 | Discharge: 2022-02-19 | Disposition: A | Discharge status: Home / Self Care | Payer: Medicaid Other | Attending: Emergency Medicine | Admitting: Emergency Medicine

## 2022-02-19 ENCOUNTER — Ambulatory Visit: Payer: Medicaid Other | Admitting: Physician Assistant

## 2022-02-19 VITALS — BP 83/54 | HR 65

## 2022-02-19 DIAGNOSIS — M109 Gout, unspecified: Secondary | ICD-10-CM | POA: Insufficient documentation

## 2022-02-19 DIAGNOSIS — R55 Syncope and collapse: Secondary | ICD-10-CM | POA: Diagnosis not present

## 2022-02-19 DIAGNOSIS — C61 Malignant neoplasm of prostate: Secondary | ICD-10-CM | POA: Diagnosis not present

## 2022-02-19 DIAGNOSIS — R0689 Other abnormalities of breathing: Secondary | ICD-10-CM | POA: Diagnosis not present

## 2022-02-19 DIAGNOSIS — R61 Generalized hyperhidrosis: Secondary | ICD-10-CM | POA: Diagnosis not present

## 2022-02-19 LAB — BASIC METABOLIC PANEL
Anion gap: 4 — ABNORMAL LOW (ref 5–15)
BUN: 16 mg/dL (ref 6–20)
CO2: 22 mmol/L (ref 22–32)
Calcium: 10 mg/dL (ref 8.9–10.3)
Chloride: 114 mmol/L — ABNORMAL HIGH (ref 98–111)
Creatinine, Ser: 1.39 mg/dL — ABNORMAL HIGH (ref 0.61–1.24)
GFR, Estimated: 58 mL/min — ABNORMAL LOW (ref >60)
Glucose, Bld: 123 mg/dL — ABNORMAL HIGH (ref 70–99)
Potassium: 4.6 mmol/L (ref 3.5–5.1)
Sodium: 140 mmol/L (ref 135–145)

## 2022-02-19 LAB — URINALYSIS, ROUTINE W REFLEX MICROSCOPIC
Bilirubin Urine: NEGATIVE
Glucose, UA: NEGATIVE mg/dL
Ketones, ur: NEGATIVE mg/dL
Nitrite: NEGATIVE
Protein, ur: 30 mg/dL — AB
Specific Gravity, Urine: 1.014 (ref 1.005–1.030)
Squamous Epithelial / HPF: NONE SEEN (ref 0–5)
pH: 5 (ref 5.0–8.0)

## 2022-02-19 LAB — CBC
HCT: 34.9 % — ABNORMAL LOW (ref 39.0–52.0)
Hemoglobin: 10.9 g/dL — ABNORMAL LOW (ref 13.0–17.0)
MCH: 29.7 pg (ref 26.0–34.0)
MCHC: 31.2 g/dL (ref 30.0–36.0)
MCV: 95.1 fL (ref 80.0–100.0)
Platelets: 352 10*3/uL (ref 150–400)
RBC: 3.67 MIL/uL — ABNORMAL LOW (ref 4.22–5.81)
RDW: 14.9 % (ref 11.5–15.5)
WBC: 15.3 10*3/uL — ABNORMAL HIGH (ref 4.0–10.5)
nRBC: 0 % (ref 0.0–0.2)

## 2022-02-19 LAB — GLUCOSE HEMOCUE WAIVED: Glu Hemocue Waived: 129 mg/dL — ABNORMAL HIGH (ref 70–99)

## 2022-02-19 LAB — TROPONIN I (HIGH SENSITIVITY)
Troponin I (High Sensitivity): 13 ng/L (ref <18)
Troponin I (High Sensitivity): 13 ng/L (ref <18)

## 2022-02-19 MED ORDER — COLCHICINE 0.6 MG PO TABS
1.2000 mg | ORAL_TABLET | Freq: Once | ORAL | Status: AC
  Administered 2022-02-19: 1.2 mg via ORAL
  Filled 2022-02-19: qty 2

## 2022-02-19 MED ORDER — SODIUM CHLORIDE 0.9 % IV BOLUS
1000.0000 mL | Freq: Once | INTRAVENOUS | Status: AC
  Administered 2022-02-19: 1000 mL via INTRAVENOUS

## 2022-02-19 MED ORDER — COLCHICINE 0.6 MG PO TABS: 0.6000 mg | ORAL_TABLET | ORAL | 0 refills | Status: DC | PRN

## 2022-02-19 MED ORDER — KETOROLAC TROMETHAMINE 15 MG/ML IJ SOLN
15.0000 mg | Freq: Once | INTRAMUSCULAR | Status: AC
  Administered 2022-02-19: 15 mg via INTRAVENOUS
  Filled 2022-02-19: qty 1

## 2022-02-19 NOTE — Assessment & Plan Note (Signed)
Acute, new concern Clinic staff reported that patient appeared to become weak and diaphoretic as he was entering the building for apt  Patient was extremely diaphoretic on observation with GCS of 13 and hypotensive Blood glucose measured in office was 129  EMS was called to take patient to ED for evaluation as there are concerns for potential sepsis given previous hx but differential is not limited to this at this time.  Recommend follow up after release from ED for monitoring and further coordination of care.

## 2022-02-19 NOTE — Progress Notes (Signed)
Acute Office Visit   Patient: Todd Craig   DOB: 31-Jul-1960   61 y.o. Male  MRN: [id number] Visit Date: 02/19/2022  Today's healthcare provider: Dani Gobble Kamyia Thomason, PA-C  Introduced myself to the patient as a Journalist, newspaper and provided education on APPs in clinical practice.    Chief Complaint  Patient presents with   Gout    Patient was coming in for gout pain in right foot   Loss of Consciousness    Patient became diaphoretic and weak as he was ambulating into clinic     Subjective    Loss of Consciousness Associated symptoms include diaphoresis, light-headedness and weakness.   HPI     Gout    Additional comments: Patient was coming in for gout pain in right foot        Loss of Consciousness    Additional comments: Patient became diaphoretic and weak as he was ambulating into clinic        Last edited by Braedyn Kauk, Dani Gobble, PA-C on 02/19/2022 12:38 PM.        Medications: No facility-administered medications prior to visit.   Outpatient Medications Prior to Visit  Medication Sig   amLODipine (NORVASC) 10 MG tablet Take 1 tablet (10 mg total) by mouth daily.   losartan (COZAAR) 100 MG tablet Take 100 mg by mouth daily.   rosuvastatin (CRESTOR) 20 MG tablet Take 1 tablet (20 mg total) by mouth daily.    Review of Systems  Constitutional:  Positive for diaphoresis and fatigue.  Cardiovascular:  Positive for syncope.  Neurological:  Positive for weakness and light-headedness.       Objective    BP (!) 83/54 (BP Location: Left Arm, Patient Position: Sitting, Cuff Size: Large)   Pulse 65   SpO2 100%    Physical Exam Vitals reviewed.  Constitutional:      Appearance: Normal appearance. He is well-developed and well-groomed. He is ill-appearing and diaphoretic.  HENT:     Head: Normocephalic and atraumatic.  Eyes:     General: Lids are normal.  Cardiovascular:     Rate and Rhythm: Normal rate and regular rhythm.     Pulses:          Radial pulses are 1+ on  the right side and 1+ on the left side.     Heart sounds: Normal heart sounds.  Pulmonary:     Effort: Pulmonary effort is normal.     Breath sounds: Normal breath sounds. No decreased air movement. No decreased breath sounds.  Neurological:     Mental Status: He is easily aroused. He is lethargic.     GCS: GCS eye subscore is 3. GCS verbal subscore is 4. GCS motor subscore is 6.  Psychiatric:        Attention and Perception: He is inattentive.        Speech: Speech is delayed and slurred.        Behavior: Behavior is cooperative.       Results for orders placed or performed during the hospital encounter of 62/83/66  Basic metabolic panel  Result Value Ref Range   Sodium 140 135 - 145 mmol/L   Potassium 4.6 3.5 - 5.1 mmol/L   Chloride 114 (H) 98 - 111 mmol/L   CO2 22 22 - 32 mmol/L   Glucose, Bld 123 (H) 70 - 99 mg/dL   BUN 16 6 - 20 mg/dL   Creatinine, Ser 1.39 (H) 0.61 -  1.24 mg/dL   Calcium 10.0 8.9 - 10.3 mg/dL   GFR, Estimated 58 (L) >60 mL/min   Anion gap 4 (L) 5 - 15  CBC  Result Value Ref Range   WBC 15.3 (H) 4.0 - 10.5 K/uL   RBC 3.67 (L) 4.22 - 5.81 MIL/uL   Hemoglobin 10.9 (L) 13.0 - 17.0 g/dL   HCT 34.9 (L) 39.0 - 52.0 %   MCV 95.1 80.0 - 100.0 fL   MCH 29.7 26.0 - 34.0 pg   MCHC 31.2 30.0 - 36.0 g/dL   RDW 14.9 11.5 - 15.5 %   Platelets 352 150 - 400 K/uL   nRBC 0.0 0.0 - 0.2 %  Results for orders placed or performed in visit on 02/19/22  Glucose Hemocue Waived (STAT)  Result Value Ref Range   Glu Hemocue Waived 129 (H) 70 - 99 mg/dL    Assessment & Plan      No follow-ups on file.     Problem List Items Addressed This Visit       Cardiovascular and Mediastinum   Syncope, near - Primary    Acute, new concern Clinic staff reported that patient appeared to become weak and diaphoretic as he was entering the building for apt  Patient was extremely diaphoretic on observation with GCS of 13 and hypotensive Blood glucose measured in office was 129   EMS was called to take patient to ED for evaluation as there are concerns for potential sepsis given previous hx but differential is not limited to this at this time.  Recommend follow up after release from ED for monitoring and further coordination of care.        Relevant Orders   Glucose Hemocue Waived (STAT) (Completed)   Other Visit Diagnoses     Diaphoresis       See near syncope A&P for management   Relevant Orders   Glucose Hemocue Waived (STAT) (Completed)        No follow-ups on file.   I, Bryton Waight E Britanie Harshman, PA-C, have reviewed all documentation for this visit. The documentation on 02/19/22 for the exam, diagnosis, procedures, and orders are all accurate and complete.   Talitha Givens, MHS, PA-C [facility]

## 2022-02-19 NOTE — ED Triage Notes (Signed)
Pt to ED via [facility] from [facility]. Pt went to PCP for right foot pain due to gout. Pt reports when he got there he started feeling really dizzy, became weak, pale and diaphoretic. BP at PCP 45W systolic.

## 2022-02-19 NOTE — ED Triage Notes (Signed)
Patient arrived by EMS from [facility]. Went to be seen for gout in right foot. Patient became weak, pale and diaphoretic at appointment. Staff at doctors office reported systolic pressure in 02V.  EMS vitals: 122CBG 115/72 b/p 61YO

## 2022-02-19 NOTE — ED Notes (Signed)
Pt leaves with stable VS, medicated prior to d/c.   Verbalizes understanding of all discharge instruction and follow up instructions. Leaves with girlfriend.

## 2022-02-19 NOTE — ED Provider Notes (Signed)
[facility] Provider Note    Event Date/Time   First MD Initiated Contact with Patient 02/19/22 1221     (approximate)   History   Near Syncope   HPI  Todd Craig is a 61 y.o. male who presents to the emergency department with dizziness and lightheadedness.  Patient states that he woke up he felt very dizzy and lightheaded.  States that he felt like he was going to pass out.  He went to go to his primary care physician office but was unable to be seen because he had low blood pressure statements and brought to the emergency department.  Tonight.  Chest pain or shortness of breath.  States that he is feeling much better now without any intervention.  Denies abdominal pain.  Complaining of significant pain to his right chest consistent with prior Episodes.     Physical Exam   Triage Vital Signs: ED Triage Vitals  Enc Vitals Group     BP 02/19/22 1150 115/61     Pulse Rate 02/19/22 1150 94     Resp 02/19/22 1150 18     Temp 02/19/22 1150 98.7 F (37.1 C)     Temp Source 02/19/22 1150 Oral     SpO2 02/19/22 1150 98 %     Weight 02/19/22 1235 202 lb (91.6 kg)     Height 02/19/22 1235 '6\' 2"'$  (1.88 m)     Head Circumference --      Peak Flow --      Pain Score 02/19/22 1151 10     Pain Loc --      Pain Edu? --      Excl. in [place]? --     Most recent vital signs: Vitals:   02/19/22 1515 02/19/22 1530  BP: 123/84 129/79  Pulse: 86 87  Resp: 20 20  Temp:    SpO2: 97% 100%    Physical Exam Constitutional:      Appearance: He is well-developed.  HENT:     Head: Atraumatic.  Eyes:     Conjunctiva/sclera: Conjunctivae normal.  Cardiovascular:     Rate and Rhythm: Normal rate and regular rhythm.     Heart sounds: No murmur heard. Pulmonary:     Effort: No respiratory distress.     Breath sounds: No wheezing.  Genitourinary:    Comments: Foley catheter in place Musculoskeletal:     Cervical back: Normal range of motion.  Skin:    General:  Skin is warm.  Neurological:     Mental Status: He is alert. Mental status is at baseline.     Comments: Cranial nerves intact.  No nystagmus.  Extraocular movements intact.  Normal finger-nose.  5/5 strength bilateral upper and lower extremities.  Sensation intact bilateral upper and lower extremities.          IMPRESSION / MDM / ASSESSMENT AND PLAN / ED COURSE  I reviewed the triage vital signs and the nursing notes.  Differential diagnosis including dehydration, vasovagal episode from his acute gout flare, ACS, dysrhythmia, infectious process  Patient given 1 L of IV fluids.  EKG  My interpretation of the EKG -normal sinus rhythm.  Normal intervals.  No chamber enlargement.  No significant ST elevation or depression.  No signs of acute ischemia or dysrhythmia.  No tachycardic or bradycardic dysrhythmias while on cardiac telemetry.  RADIOLOGY I independently reviewed imaging, my interpretation of imaging: Chest x-ray without focal findings consistent with pneumonia.  Chest x-ray was read as  no acute findings.      ED Results / Procedures / Treatments   Labs (all labs ordered are listed, but only abnormal results are displayed) Labs interpreted as -   1 L of IV fluid  Labs Reviewed  BASIC METABOLIC PANEL - Abnormal; Notable for the following components:      Result Value   Chloride 114 (*)    Glucose, Bld 123 (*)    Creatinine, Ser 1.39 (*)    GFR, Estimated 58 (*)    Anion gap 4 (*)    All other components within normal limits  CBC - Abnormal; Notable for the following components:   WBC 15.3 (*)    RBC 3.67 (*)    Hemoglobin 10.9 (*)    HCT 34.9 (*)    All other components within normal limits  URINALYSIS, ROUTINE W REFLEX MICROSCOPIC - Abnormal; Notable for the following components:   Color, Urine YELLOW (*)    APPearance HAZY (*)    Hgb urine dipstick SMALL (*)    Protein, ur 30 (*)    Leukocytes,Ua SMALL (*)    Bacteria, UA RARE (*)    All other  components within normal limits  URINE CULTURE  CBG MONITORING, ED  TROPONIN I (HIGH SENSITIVITY)  TROPONIN I (HIGH SENSITIVITY)     Orthostatic blood pressures were normal.  Patient was asymptomatic back to his normal state.  chart review of outside records patient had a prior admission for complicated urinary tract infection following his prostatectomy does appear that it was sensitive to ciprofloxacin but did.  Serratia and Pseudomonas.  Patient was altered with low blood pressure at that time.  UA without signs of urinary tract infection.  Urine culture was obtained.  Patient states he is feeling much better.  Most likely with vasovagal.  Episode in the setting of pain from his gout.  We will start the patient on colchicine.  Discussed close follow-up with primary care physician and cardiologist.     PROCEDURES:  Critical Care performed: No  Procedures  Patient's presentation is most consistent with acute presentation with potential threat to life or bodily function.   MEDICATIONS ORDERED IN ED: Medications  colchicine tablet 1.2 mg (has no administration in time range)  sodium chloride 0.9 % bolus 1,000 mL (1,000 mLs Intravenous New Bag/Given 02/19/22 1346)  ketorolac (TORADOL) 15 MG/ML injection 15 mg (15 mg Intravenous Given 02/19/22 1346)    FINAL CLINICAL IMPRESSION(S) / ED DIAGNOSES   Final diagnoses:  Syncope, near  Near syncope  Gout of right foot, unspecified cause, unspecified chronicity     Rx / DC Orders   ED Discharge Orders          Ordered    Ambulatory referral to Cardiology       Comments: If you have not heard from the Cardiology office within the next 72 hours please call [phone].   02/19/22 1523    colchicine 0.6 MG tablet  Every 2 hours PRN        02/19/22 1610             Note:  This document was prepared using Dragon voice recognition software and may include unintentional dictation errors.   Nathaniel Man, MD 02/19/22  [phone]

## 2022-02-19 NOTE — Progress Notes (Deleted)
Established Patient Office Visit  Name: Todd Craig   MRN: [id number]    DOB: 1960-05-06   Date:02/19/2022  Today's Provider: Talitha Givens, MHS, PA-C Introduced myself to the patient as a PA-C and provided education on APPs in clinical practice.         Subjective  Chief Complaint  No chief complaint on file.   HPI   Patient Active Problem List   Diagnosis Date Noted   Foley catheter in place 01/31/2022   H/O abdominal prostatectomy 01/19/2022   Poor mobility 01/17/2022   Prostate cancer ([place]) 09/18/2021   Cyst of left kidney 07/09/2021   Other hyperparathyroidism ([place]) 05/08/2021   Adhesive capsulitis of right shoulder 03/02/2021   H/O: stroke 02/18/2021   GERD without esophagitis 02/18/2021   Gout 02/18/2021   Alcohol use 02/18/2021   Vitamin D deficiency 03/10/2020   Persistent proteinuria 03/10/2020   Stage 3a chronic kidney disease ([place]) 03/10/2020   Hypercholesteremia 03/10/2020   Hypercalcemia 02/17/2020   Primary hypertension 01/19/2020   Nicotine dependence due to vaping tobacco product 01/19/2020   Overweight with body mass index (BMI) 25.0-29.9 01/19/2020    Past Surgical History:  Procedure Laterality Date   PROSTATE BIOPSY N/A 08/01/2021   Procedure: PROSTATE BIOPSY;  Surgeon: Abbie Sons, MD;  Location: [facility] ORS;  Service: Urology;  Laterality: N/A;   right hand repair  1985   trauma with saw laceration    TRANSRECTAL ULTRASOUND N/A 08/01/2021   Procedure: TRANSRECTAL ULTRASOUND;  Surgeon: Abbie Sons, MD;  Location: [facility] ORS;  Service: Urology;  Laterality: N/A;    No family history on file.  Social History   Tobacco Use   Smoking status: Every Day    Packs/day: 0.50    Types: Cigarettes   Smokeless tobacco: Never  Substance Use Topics   Alcohol use: Yes    Alcohol/week: 5.0 standard drinks of alcohol    Types: 5 Cans of beer per week    Comment: on weekends     Current Outpatient Medications:    amLODipine (NORVASC)  10 MG tablet, Take 1 tablet (10 mg total) by mouth daily., Disp: 90 tablet, Rfl: 4   losartan (COZAAR) 100 MG tablet, Take 100 mg by mouth daily., Disp: , Rfl:    rosuvastatin (CRESTOR) 20 MG tablet, Take 1 tablet (20 mg total) by mouth daily., Disp: 90 tablet, Rfl: 4  Allergies  Allergen Reactions   Tramadol     Other reaction(s): Other    I personally reviewed {Reviewed:14835} with the patient/caregiver today.   ROS    Objective  There were no vitals filed for this visit.  There is no height or weight on file to calculate BMI.  Physical Exam   Recent Results (from the past 2160 hour(s))  CBC     Status: Abnormal   Collection Time: 12/29/21  3:05 PM  Result Value Ref Range   WBC 14.7 (H) 4.0 - 10.5 K/uL   RBC 3.61 (L) 4.22 - 5.81 MIL/uL   Hemoglobin 11.7 (L) 13.0 - 17.0 g/dL   HCT 36.2 (L) 39.0 - 52.0 %   MCV 100.3 (H) 80.0 - 100.0 fL   MCH 32.4 26.0 - 34.0 pg   MCHC 32.3 30.0 - 36.0 g/dL   RDW 12.8 11.5 - 15.5 %   Platelets 350 150 - 400 K/uL   nRBC 0.0 0.0 - 0.2 %    Comment: Performed at [facility], [address] [place], [place] [address]  Comprehensive metabolic panel     Status: Abnormal   Collection Time: 12/29/21  3:05 PM  Result Value Ref Range   Sodium 138 135 - 145 mmol/L   Potassium 4.1 3.5 - 5.1 mmol/L   Chloride 108 98 - 111 mmol/L   CO2 25 22 - 32 mmol/L   Glucose, Bld 111 (H) 70 - 99 mg/dL    Comment: Glucose reference range applies only to samples taken after fasting for at least 8 hours.   BUN 10 6 - 20 mg/dL   Creatinine, Ser 1.44 (H) 0.61 - 1.24 mg/dL   Calcium 11.4 (H) 8.9 - 10.3 mg/dL   Total Protein 7.3 6.5 - 8.1 g/dL   Albumin 3.2 (L) 3.5 - 5.0 g/dL   AST 17 15 - 41 U/L   ALT 7 0 - 44 U/L   Alkaline Phosphatase 100 38 - 126 U/L   Total Bilirubin 0.9 0.3 - 1.2 mg/dL   GFR, Estimated 56 (L) >60 mL/min    Comment: (NOTE) Calculated using the CKD-EPI Creatinine Equation (2021)    Anion gap 5 5 - 15    Comment: Performed at  [facility], [place] [address], [place], [place] [address]  Lipase, blood     Status: None   Collection Time: 12/29/21  3:05 PM  Result Value Ref Range   Lipase 26 11 - 51 U/L    Comment: Performed at [facility], [place] [address], [place], [place] [address]  Troponin I (High Sensitivity)     Status: None   Collection Time: 12/29/21  3:20 PM  Result Value Ref Range   Troponin I (High Sensitivity) 15 <18 ng/L    Comment: (NOTE) Elevated high sensitivity troponin I (hsTnI) values and significant  changes across serial measurements may suggest ACS but many other  chronic and acute conditions are known to elevate hsTnI results.  Refer to the "Links" section for chest pain algorithms and additional  guidance. Performed at [facility], [place] [address], [place], [place] [address]   Magnesium     Status: Abnormal   Collection Time: 12/29/21  4:00 PM  Result Value Ref Range   Magnesium 1.6 (L) 1.7 - 2.4 mg/dL    Comment: Performed at [facility] [address] [place], [place] [address]  CBC w/Diff     Status: Abnormal   Collection Time: 01/18/22  2:25 PM  Result Value Ref Range   WBC 6.0 3.4 - 10.8 x10E3/uL   RBC 4.13 (L) 4.14 - 5.80 x10E6/uL   Hemoglobin 13.2 13.0 - 17.7 g/dL   Hematocrit 37.8 37.5 - 51.0 %   MCV 92 79 - 97 fL   MCH 32.0 26.6 - 33.0 pg   MCHC 34.9 31.5 - 35.7 g/dL   RDW 12.7 11.6 - 15.4 %   Platelets 259 150 - 450 x10E3/uL   Neutrophils 49 Not Estab. %   Lymphs 39 Not Estab. %   Monocytes 8 Not Estab. %   Eos 3 Not Estab. %   Basos 1 Not Estab. %   Neutrophils Absolute 3.0 1.4 - 7.0 x10E3/uL   Lymphocytes Absolute 2.3 0.7 - 3.1 x10E3/uL   Monocytes Absolute 0.5 0.1 - 0.9 x10E3/uL   EOS (ABSOLUTE) 0.2 0.0 - 0.4 x10E3/uL   Basophils Absolute 0.0 0.0 - 0.2 x10E3/uL   Immature Granulocytes 0 Not Estab. %   Immature Grans (Abs) 0.0 0.0 - 0.1 x10E3/uL  Comp Met (CMET)     Status: Abnormal   Collection Time: 01/18/22  2:25 PM  Result Value  Ref Range   Glucose 100 (H) 70 - 99 mg/dL   BUN 9 8 - 27 mg/dL   Creatinine, Ser 0.76 0.76 - 1.27 mg/dL   eGFR 103 >59 mL/min/1.73   BUN/Creatinine Ratio 12 10 - 24   Sodium 136 134 - 144 mmol/L   Potassium 4.3 3.5 - 5.2 mmol/L   Chloride 98 96 - 106 mmol/L   CO2 21 20 - 29 mmol/L   Calcium 9.8 8.6 - 10.2 mg/dL   Total Protein 7.1 6.0 - 8.5 g/dL   Albumin 4.5 3.8 - 4.9 g/dL   Globulin, Total 2.6 1.5 - 4.5 g/dL   Albumin/Globulin Ratio 1.7 1.2 - 2.2   Bilirubin Total 0.3 0.0 - 1.2 mg/dL   Alkaline Phosphatase 54 44 - 121 IU/L   AST 17 0 - 40 IU/L   ALT 13 0 - 44 IU/L     PHQ2/9:    01/31/2022   10:49 AM 01/18/2022    1:55 PM 06/01/2021   10:49 AM 03/02/2021   11:24 AM 02/16/2020   11:12 AM  Depression screen PHQ 2/9  Decreased Interest _0 0  Down, Depressed, Hopeless 1 2 0 0 0  PHQ - 2 Score _1 0  Altered sleeping 1 3 0 2 0  Tired, decreased energy _2 0  Change in appetite 1 0 0 0 0  Feeling bad or failure about yourself  1 2 0 0 0  Trouble concentrating 1 2 0 1 0  Moving slowly or fidgety/restless _3 0 0  Suicidal thoughts 0 0 0 0 0  PHQ-9 Score _4 0  Difficult doing work/chores Somewhat difficult Very difficult  Somewhat difficult Not difficult at all      Fall Risk:    01/18/2022    1:55 PM 06/01/2021   10:49 AM 03/08/2020   10:27 AM 01/11/2020   10:37 AM  Fall Risk   Falls in the past year? 1 0 0 1  Number falls in past yr: 1 0  1  Injury with Fall? 1 0  0  Risk for fall due to : Impaired mobility;Impaired balance/gait No Fall Risks  History of fall(s)  Follow up Falls evaluation completed Falls evaluation completed Falls evaluation completed Falls evaluation completed      Functional Status Survey:      Assessment & Plan

## 2022-02-20 ENCOUNTER — Telehealth: Payer: Self-pay | Admitting: *Deleted

## 2022-02-20 DIAGNOSIS — N390 Urinary tract infection, site not specified: Secondary | ICD-10-CM | POA: Diagnosis not present

## 2022-02-20 DIAGNOSIS — F17210 Nicotine dependence, cigarettes, uncomplicated: Secondary | ICD-10-CM | POA: Diagnosis not present

## 2022-02-20 DIAGNOSIS — C61 Malignant neoplasm of prostate: Secondary | ICD-10-CM | POA: Diagnosis not present

## 2022-02-20 DIAGNOSIS — I129 Hypertensive chronic kidney disease with stage 1 through stage 4 chronic kidney disease, or unspecified chronic kidney disease: Secondary | ICD-10-CM | POA: Diagnosis not present

## 2022-02-20 DIAGNOSIS — B965 Pseudomonas (aeruginosa) (mallei) (pseudomallei) as the cause of diseases classified elsewhere: Secondary | ICD-10-CM | POA: Diagnosis not present

## 2022-02-20 DIAGNOSIS — N1831 Chronic kidney disease, stage 3a: Secondary | ICD-10-CM | POA: Diagnosis not present

## 2022-02-20 DIAGNOSIS — E7800 Pure hypercholesterolemia, unspecified: Secondary | ICD-10-CM | POA: Diagnosis not present

## 2022-02-20 DIAGNOSIS — Z8673 Personal history of transient ischemic attack (TIA), and cerebral infarction without residual deficits: Secondary | ICD-10-CM | POA: Diagnosis not present

## 2022-02-20 DIAGNOSIS — E213 Hyperparathyroidism, unspecified: Secondary | ICD-10-CM | POA: Diagnosis not present

## 2022-02-20 DIAGNOSIS — T83511D Infection and inflammatory reaction due to indwelling urethral catheter, subsequent encounter: Secondary | ICD-10-CM | POA: Diagnosis not present

## 2022-02-20 DIAGNOSIS — D631 Anemia in chronic kidney disease: Secondary | ICD-10-CM | POA: Diagnosis not present

## 2022-02-20 DIAGNOSIS — Z6829 Body mass index (BMI) 29.0-29.9, adult: Secondary | ICD-10-CM | POA: Diagnosis not present

## 2022-02-20 DIAGNOSIS — Z9181 History of falling: Secondary | ICD-10-CM | POA: Diagnosis not present

## 2022-02-20 DIAGNOSIS — K219 Gastro-esophageal reflux disease without esophagitis: Secondary | ICD-10-CM | POA: Diagnosis not present

## 2022-02-20 DIAGNOSIS — E559 Vitamin D deficiency, unspecified: Secondary | ICD-10-CM | POA: Diagnosis not present

## 2022-02-20 DIAGNOSIS — E663 Overweight: Secondary | ICD-10-CM | POA: Diagnosis not present

## 2022-02-20 DIAGNOSIS — M109 Gout, unspecified: Secondary | ICD-10-CM | POA: Diagnosis not present

## 2022-02-20 NOTE — Patient Outreach (Signed)
Transition Care Management Follow-up Telephone Call Date of discharge and from where: 02/19/22 from [facility] How have you been since you were released from the hospital? Patient's DPR/Brenda reports he is "doing better" Any questions or concerns? No  Items Reviewed: Did the pt receive and understand the discharge instructions provided? Yes  Medications obtained and verified? Yes  Other? No  Any new allergies since your discharge? No  Dietary orders reviewed? Yes Do you have support at home? Yes   Home Care and Equipment/Supplies: Were home health services ordered? no If so, what is the name of the agency? N/A  Has the agency set up a time to come to the patient's home? not applicable Were any new equipment or medical supplies ordered?  No What is the name of the medical supply agency? N/A Were you able to get the supplies/equipment? not applicable Do you have any questions related to the use of the equipment or supplies? No  Functional Questionnaire: (I = Independent and D = Dependent) ADLs: I  Bathing/Dressing- I  Meal Prep- I  Eating- I  Maintaining continence- I  Transferring/Ambulation- I  Managing Meds- I  Follow up appointments reviewed:  PCP Hospital f/u appt confirmed? Yes  Scheduled to see PCP on 02/22/22. [facility] f/u appt confirmed?  N/A, ED visit . Are transportation arrangements needed? No  If their condition worsens, is the pt aware to call PCP or go to the Emergency Dept.? Yes Was the patient provided with contact information for the PCP's office or ED? No Was to pt encouraged to call back with questions or concerns? Yes  Lurena Joiner RN, BSN [place]  [employer] manager

## 2022-02-21 DIAGNOSIS — Z8673 Personal history of transient ischemic attack (TIA), and cerebral infarction without residual deficits: Secondary | ICD-10-CM | POA: Diagnosis not present

## 2022-02-21 DIAGNOSIS — E7800 Pure hypercholesterolemia, unspecified: Secondary | ICD-10-CM | POA: Diagnosis not present

## 2022-02-21 DIAGNOSIS — I129 Hypertensive chronic kidney disease with stage 1 through stage 4 chronic kidney disease, or unspecified chronic kidney disease: Secondary | ICD-10-CM | POA: Diagnosis not present

## 2022-02-21 DIAGNOSIS — B965 Pseudomonas (aeruginosa) (mallei) (pseudomallei) as the cause of diseases classified elsewhere: Secondary | ICD-10-CM | POA: Diagnosis not present

## 2022-02-21 DIAGNOSIS — Z9181 History of falling: Secondary | ICD-10-CM | POA: Diagnosis not present

## 2022-02-21 DIAGNOSIS — K219 Gastro-esophageal reflux disease without esophagitis: Secondary | ICD-10-CM | POA: Diagnosis not present

## 2022-02-21 DIAGNOSIS — C61 Malignant neoplasm of prostate: Secondary | ICD-10-CM | POA: Diagnosis not present

## 2022-02-21 DIAGNOSIS — N390 Urinary tract infection, site not specified: Secondary | ICD-10-CM | POA: Diagnosis not present

## 2022-02-21 DIAGNOSIS — M109 Gout, unspecified: Secondary | ICD-10-CM | POA: Diagnosis not present

## 2022-02-21 DIAGNOSIS — T83511D Infection and inflammatory reaction due to indwelling urethral catheter, subsequent encounter: Secondary | ICD-10-CM | POA: Diagnosis not present

## 2022-02-21 DIAGNOSIS — N1831 Chronic kidney disease, stage 3a: Secondary | ICD-10-CM | POA: Diagnosis not present

## 2022-02-21 DIAGNOSIS — E663 Overweight: Secondary | ICD-10-CM | POA: Diagnosis not present

## 2022-02-21 DIAGNOSIS — E213 Hyperparathyroidism, unspecified: Secondary | ICD-10-CM | POA: Diagnosis not present

## 2022-02-21 DIAGNOSIS — D631 Anemia in chronic kidney disease: Secondary | ICD-10-CM | POA: Diagnosis not present

## 2022-02-21 DIAGNOSIS — F17210 Nicotine dependence, cigarettes, uncomplicated: Secondary | ICD-10-CM | POA: Diagnosis not present

## 2022-02-21 DIAGNOSIS — E559 Vitamin D deficiency, unspecified: Secondary | ICD-10-CM | POA: Diagnosis not present

## 2022-02-21 DIAGNOSIS — Z6829 Body mass index (BMI) 29.0-29.9, adult: Secondary | ICD-10-CM | POA: Diagnosis not present

## 2022-02-22 ENCOUNTER — Encounter: Payer: Self-pay | Admitting: Physician Assistant

## 2022-02-22 ENCOUNTER — Telehealth (INDEPENDENT_AMBULATORY_CARE_PROVIDER_SITE_OTHER): Payer: Medicaid Other | Admitting: Physician Assistant

## 2022-02-22 DIAGNOSIS — M109 Gout, unspecified: Secondary | ICD-10-CM | POA: Diagnosis not present

## 2022-02-22 DIAGNOSIS — R319 Hematuria, unspecified: Secondary | ICD-10-CM

## 2022-02-22 DIAGNOSIS — R55 Syncope and collapse: Secondary | ICD-10-CM

## 2022-02-22 DIAGNOSIS — N390 Urinary tract infection, site not specified: Secondary | ICD-10-CM | POA: Insufficient documentation

## 2022-02-22 MED ORDER — LINEZOLID 600 MG PO TABS: 600.0000 mg | ORAL_TABLET | Freq: Two times a day (BID) | ORAL | 0 refills | Status: AC

## 2022-02-22 NOTE — Assessment & Plan Note (Signed)
Acute, new concern- appears resolved at this time after  ED evaluation Suspect this was mult-factorial as he has another active UTI with catheter in place as well as several electrolyte abnormalities which could signal dehydration His wife/partner states he has had a few episodes like this and would like a referral placed to Neurology for assessment Referral placed today per request Recommend he stay well hydrated and will send in script for abx for UTI per culture recommendations Follow up as needed to assist with collaboration of care.

## 2022-02-22 NOTE — Patient Instructions (Addendum)
Please stay well hydrated and complete the full course of antibiotics for your UTI  Please take the Linezolid by mouth twice per day for 10 days  Please finish the entire course unless you have side effects   I have placed a referral to Neurology for you- the referral team should reach out soon to help set up the apt  Please follow up about a week after the Urology apt.

## 2022-02-22 NOTE — Progress Notes (Signed)
Virtual Visit via Video Note  I connected with Todd Craig on 02/22/22 at 11:20 AM EST by a video enabled telemedicine application and verified that I am speaking with the correct person using two identifiers.  Today's Provider: Talitha Givens, MHS, PA-C Introduced myself to the patient as a PA-C and provided education on APPs in clinical practice.     Location: Patient: at home, [place], [place]  Provider: [place], [place]    I discussed the limitations of evaluation and management by telemedicine and the availability of in person appointments. The patient expressed understanding and agreed to proceed.  Chief Complaint  Patient presents with   Near Syncope    ER follow up as well. Patient would like a referral for Neurologist     History of Present Illness:   Near Syncope episode Reviewed ED visit notes and results Appears to have yet another UTI with Enterococcus faecium - susceptible to linezolid per culture results from ED  Catheter is still in place - no mention today of pain or discomfort He has follow up with [facility] urology on 02/27/22  Wife reports he had PT the day before yesterday and did well with this   Gout Was started on colchicine in the ED for this concern Reports pain is improved since starting   His partner is with him and helping with HPI She would like him to be referred to Neurology in [place] to assess for cause of near syncope     Review of Systems  Constitutional:  Negative for chills, diaphoresis and fever.  Respiratory:  Negative for shortness of breath and wheezing.   Cardiovascular:  Negative for chest pain and leg swelling.  Musculoskeletal:  Negative for falls.  Neurological:  Negative for dizziness, tingling, loss of consciousness and headaches.     Observations/Objective:   Due to the nature of the virtual visit, physical exam and observations are limited. Able to obtain the following  observations:   Alert, oriented, minimal conversation Appears comfortable, in no acute distress.  No scleral injection, no appreciated hoarseness, tachypnea, wheeze or strider. Able to maintain conversation without visible strain.  No cough appreciated during visit.      Assessment and Plan:  Problem List Items Addressed This Visit       Cardiovascular and Mediastinum   Syncope, near    Acute, new concern- appears resolved at this time after  ED evaluation Suspect this was mult-factorial as he has another active UTI with catheter in place as well as several electrolyte abnormalities which could signal dehydration His wife/partner states he has had a few episodes like this and would like a referral placed to Neurology for assessment Referral placed today per request Recommend he stay well hydrated and will send in script for abx for UTI per culture recommendations Follow up as needed to assist with collaboration of care.       Relevant Orders   Ambulatory referral to Neurology     Genitourinary   Urinary tract infection with hematuria - Primary    Acute, recurrent Patient's UA was concerning for UTI in ED earlier this week and urine culture demonstrates infection with linezolid- susceptible  Enterococcus faecium Will send in Linezolid per UTD dosing recommendations  He appears to have follow up with [facility] on 02/27/22 - will send notes from recent ED visits and today's apt to them for review as we may need to collaborate with ID to prevent further infections from recurring  He has had repeated ED visits and hospitalizations for UTIs, catheter concerns, and sepsis which have complicated recovery and would necessitate close monitoring and collaboration with Urology, ID and PCP  Recommend follow up with PCP after upcoming Urology visit to make sure care gaps are closed and team is all on the same page       Relevant Medications   linezolid (ZYVOX) 600 MG tablet     Other    Gout    Acute flare, appears to be resolving Patient was seen in the ED for this in addition to near syncope He was started on Colchicine - recommend shortest effective duration for this as his kidney function is <60  Follow up as needed for recurrent flares or concerns.       Follow Up Instructions:    I discussed the assessment and treatment plan with the patient. The patient was provided an opportunity to ask questions and all were answered. The patient agreed with the plan and demonstrated an understanding of the instructions.   The patient was advised to call back or seek an in-person evaluation if the symptoms worsen or if the condition fails to improve as anticipated.  I provided 17 minutes of non-face-to-face time during this encounter.  No follow-ups on file.   I, Ebert Forrester E Denis Carreon, PA-C, have reviewed all documentation for this visit. The documentation on 02/22/22 for the exam, diagnosis, procedures, and orders are all accurate and complete.   Talitha Givens, MHS, PA-C [facility]

## 2022-02-22 NOTE — Assessment & Plan Note (Signed)
Acute flare, appears to be resolving Patient was seen in the ED for this in addition to near syncope He was started on Colchicine - recommend shortest effective duration for this as his kidney function is <60  Follow up as needed for recurrent flares or concerns.

## 2022-02-22 NOTE — Assessment & Plan Note (Addendum)
Acute, recurrent Patient's UA was concerning for UTI in ED earlier this week and urine culture demonstrates infection with linezolid- susceptible  Enterococcus faecium Will send in Linezolid per UTD dosing recommendations  He appears to have follow up with [facility] on 02/27/22 - will send notes from recent ED visits and today's apt to them for review as we may need to collaborate with ID to prevent further infections from recurring  He has had repeated ED visits and hospitalizations for UTIs, catheter concerns, and sepsis which have complicated recovery and would necessitate close monitoring and collaboration with Urology, ID and PCP  Recommend follow up with PCP after upcoming Urology visit to make sure care gaps are closed and team is all on the same page

## 2022-02-23 DIAGNOSIS — F17210 Nicotine dependence, cigarettes, uncomplicated: Secondary | ICD-10-CM | POA: Diagnosis not present

## 2022-02-23 DIAGNOSIS — M109 Gout, unspecified: Secondary | ICD-10-CM | POA: Diagnosis not present

## 2022-02-23 DIAGNOSIS — B965 Pseudomonas (aeruginosa) (mallei) (pseudomallei) as the cause of diseases classified elsewhere: Secondary | ICD-10-CM | POA: Diagnosis not present

## 2022-02-23 DIAGNOSIS — Z8673 Personal history of transient ischemic attack (TIA), and cerebral infarction without residual deficits: Secondary | ICD-10-CM | POA: Diagnosis not present

## 2022-02-23 DIAGNOSIS — E663 Overweight: Secondary | ICD-10-CM | POA: Diagnosis not present

## 2022-02-23 DIAGNOSIS — K219 Gastro-esophageal reflux disease without esophagitis: Secondary | ICD-10-CM | POA: Diagnosis not present

## 2022-02-23 DIAGNOSIS — E559 Vitamin D deficiency, unspecified: Secondary | ICD-10-CM | POA: Diagnosis not present

## 2022-02-23 DIAGNOSIS — Z6829 Body mass index (BMI) 29.0-29.9, adult: Secondary | ICD-10-CM | POA: Diagnosis not present

## 2022-02-23 DIAGNOSIS — D631 Anemia in chronic kidney disease: Secondary | ICD-10-CM | POA: Diagnosis not present

## 2022-02-23 DIAGNOSIS — E7800 Pure hypercholesterolemia, unspecified: Secondary | ICD-10-CM | POA: Diagnosis not present

## 2022-02-23 DIAGNOSIS — I129 Hypertensive chronic kidney disease with stage 1 through stage 4 chronic kidney disease, or unspecified chronic kidney disease: Secondary | ICD-10-CM | POA: Diagnosis not present

## 2022-02-23 DIAGNOSIS — N1831 Chronic kidney disease, stage 3a: Secondary | ICD-10-CM | POA: Diagnosis not present

## 2022-02-23 DIAGNOSIS — T83511D Infection and inflammatory reaction due to indwelling urethral catheter, subsequent encounter: Secondary | ICD-10-CM | POA: Diagnosis not present

## 2022-02-23 DIAGNOSIS — E213 Hyperparathyroidism, unspecified: Secondary | ICD-10-CM | POA: Diagnosis not present

## 2022-02-23 DIAGNOSIS — C61 Malignant neoplasm of prostate: Secondary | ICD-10-CM | POA: Diagnosis not present

## 2022-02-23 DIAGNOSIS — Z9181 History of falling: Secondary | ICD-10-CM | POA: Diagnosis not present

## 2022-02-23 DIAGNOSIS — N390 Urinary tract infection, site not specified: Secondary | ICD-10-CM | POA: Diagnosis not present

## 2022-02-24 LAB — URINE CULTURE: Culture: >=100000 — AB

## 2022-02-26 DIAGNOSIS — N390 Urinary tract infection, site not specified: Secondary | ICD-10-CM | POA: Diagnosis not present

## 2022-02-26 DIAGNOSIS — F17210 Nicotine dependence, cigarettes, uncomplicated: Secondary | ICD-10-CM | POA: Diagnosis not present

## 2022-02-26 DIAGNOSIS — E559 Vitamin D deficiency, unspecified: Secondary | ICD-10-CM | POA: Diagnosis not present

## 2022-02-26 DIAGNOSIS — Z6829 Body mass index (BMI) 29.0-29.9, adult: Secondary | ICD-10-CM | POA: Diagnosis not present

## 2022-02-26 DIAGNOSIS — E213 Hyperparathyroidism, unspecified: Secondary | ICD-10-CM | POA: Diagnosis not present

## 2022-02-26 DIAGNOSIS — C61 Malignant neoplasm of prostate: Secondary | ICD-10-CM | POA: Diagnosis not present

## 2022-02-26 DIAGNOSIS — D631 Anemia in chronic kidney disease: Secondary | ICD-10-CM | POA: Diagnosis not present

## 2022-02-26 DIAGNOSIS — K219 Gastro-esophageal reflux disease without esophagitis: Secondary | ICD-10-CM | POA: Diagnosis not present

## 2022-02-26 DIAGNOSIS — Z9181 History of falling: Secondary | ICD-10-CM | POA: Diagnosis not present

## 2022-02-26 DIAGNOSIS — Z8673 Personal history of transient ischemic attack (TIA), and cerebral infarction without residual deficits: Secondary | ICD-10-CM | POA: Diagnosis not present

## 2022-02-26 DIAGNOSIS — M109 Gout, unspecified: Secondary | ICD-10-CM | POA: Diagnosis not present

## 2022-02-26 DIAGNOSIS — E663 Overweight: Secondary | ICD-10-CM | POA: Diagnosis not present

## 2022-02-26 DIAGNOSIS — B965 Pseudomonas (aeruginosa) (mallei) (pseudomallei) as the cause of diseases classified elsewhere: Secondary | ICD-10-CM | POA: Diagnosis not present

## 2022-02-26 DIAGNOSIS — E7800 Pure hypercholesterolemia, unspecified: Secondary | ICD-10-CM | POA: Diagnosis not present

## 2022-02-26 DIAGNOSIS — I129 Hypertensive chronic kidney disease with stage 1 through stage 4 chronic kidney disease, or unspecified chronic kidney disease: Secondary | ICD-10-CM | POA: Diagnosis not present

## 2022-02-26 DIAGNOSIS — T83511D Infection and inflammatory reaction due to indwelling urethral catheter, subsequent encounter: Secondary | ICD-10-CM | POA: Diagnosis not present

## 2022-02-26 DIAGNOSIS — N1831 Chronic kidney disease, stage 3a: Secondary | ICD-10-CM | POA: Diagnosis not present

## 2022-02-27 DIAGNOSIS — C61 Malignant neoplasm of prostate: Secondary | ICD-10-CM | POA: Diagnosis not present

## 2022-02-28 DIAGNOSIS — I129 Hypertensive chronic kidney disease with stage 1 through stage 4 chronic kidney disease, or unspecified chronic kidney disease: Secondary | ICD-10-CM | POA: Diagnosis not present

## 2022-02-28 DIAGNOSIS — T83511D Infection and inflammatory reaction due to indwelling urethral catheter, subsequent encounter: Secondary | ICD-10-CM | POA: Diagnosis not present

## 2022-02-28 DIAGNOSIS — Z6829 Body mass index (BMI) 29.0-29.9, adult: Secondary | ICD-10-CM | POA: Diagnosis not present

## 2022-02-28 DIAGNOSIS — M109 Gout, unspecified: Secondary | ICD-10-CM | POA: Diagnosis not present

## 2022-02-28 DIAGNOSIS — Z9181 History of falling: Secondary | ICD-10-CM | POA: Diagnosis not present

## 2022-02-28 DIAGNOSIS — E213 Hyperparathyroidism, unspecified: Secondary | ICD-10-CM | POA: Diagnosis not present

## 2022-02-28 DIAGNOSIS — C61 Malignant neoplasm of prostate: Secondary | ICD-10-CM | POA: Diagnosis not present

## 2022-02-28 DIAGNOSIS — F17210 Nicotine dependence, cigarettes, uncomplicated: Secondary | ICD-10-CM | POA: Diagnosis not present

## 2022-02-28 DIAGNOSIS — E559 Vitamin D deficiency, unspecified: Secondary | ICD-10-CM | POA: Diagnosis not present

## 2022-02-28 DIAGNOSIS — D631 Anemia in chronic kidney disease: Secondary | ICD-10-CM | POA: Diagnosis not present

## 2022-02-28 DIAGNOSIS — B965 Pseudomonas (aeruginosa) (mallei) (pseudomallei) as the cause of diseases classified elsewhere: Secondary | ICD-10-CM | POA: Diagnosis not present

## 2022-02-28 DIAGNOSIS — E663 Overweight: Secondary | ICD-10-CM | POA: Diagnosis not present

## 2022-02-28 DIAGNOSIS — E7800 Pure hypercholesterolemia, unspecified: Secondary | ICD-10-CM | POA: Diagnosis not present

## 2022-02-28 DIAGNOSIS — N390 Urinary tract infection, site not specified: Secondary | ICD-10-CM | POA: Diagnosis not present

## 2022-02-28 DIAGNOSIS — N1831 Chronic kidney disease, stage 3a: Secondary | ICD-10-CM | POA: Diagnosis not present

## 2022-02-28 DIAGNOSIS — K219 Gastro-esophageal reflux disease without esophagitis: Secondary | ICD-10-CM | POA: Diagnosis not present

## 2022-02-28 DIAGNOSIS — Z8673 Personal history of transient ischemic attack (TIA), and cerebral infarction without residual deficits: Secondary | ICD-10-CM | POA: Diagnosis not present

## 2022-03-01 ENCOUNTER — Telehealth: Payer: Self-pay

## 2022-03-01 DIAGNOSIS — C61 Malignant neoplasm of prostate: Secondary | ICD-10-CM | POA: Diagnosis not present

## 2022-03-01 NOTE — Telephone Encounter (Signed)
Home Health orders have been faxed back to [facility]

## 2022-03-02 DIAGNOSIS — C61 Malignant neoplasm of prostate: Secondary | ICD-10-CM | POA: Diagnosis not present

## 2022-03-05 DIAGNOSIS — C61 Malignant neoplasm of prostate: Secondary | ICD-10-CM | POA: Diagnosis not present

## 2022-03-06 DIAGNOSIS — C61 Malignant neoplasm of prostate: Secondary | ICD-10-CM | POA: Diagnosis not present

## 2022-03-07 DIAGNOSIS — C61 Malignant neoplasm of prostate: Secondary | ICD-10-CM | POA: Diagnosis not present

## 2022-03-08 DIAGNOSIS — C61 Malignant neoplasm of prostate: Secondary | ICD-10-CM | POA: Diagnosis not present

## 2022-03-09 DIAGNOSIS — C61 Malignant neoplasm of prostate: Secondary | ICD-10-CM | POA: Diagnosis not present

## 2022-03-12 DIAGNOSIS — C61 Malignant neoplasm of prostate: Secondary | ICD-10-CM | POA: Diagnosis not present

## 2022-03-13 DIAGNOSIS — C61 Malignant neoplasm of prostate: Secondary | ICD-10-CM | POA: Diagnosis not present

## 2022-03-14 DIAGNOSIS — E7800 Pure hypercholesterolemia, unspecified: Secondary | ICD-10-CM | POA: Diagnosis not present

## 2022-03-14 DIAGNOSIS — E559 Vitamin D deficiency, unspecified: Secondary | ICD-10-CM | POA: Diagnosis not present

## 2022-03-14 DIAGNOSIS — N1831 Chronic kidney disease, stage 3a: Secondary | ICD-10-CM | POA: Diagnosis not present

## 2022-03-14 DIAGNOSIS — C61 Malignant neoplasm of prostate: Secondary | ICD-10-CM | POA: Diagnosis not present

## 2022-03-14 DIAGNOSIS — D631 Anemia in chronic kidney disease: Secondary | ICD-10-CM | POA: Diagnosis not present

## 2022-03-14 DIAGNOSIS — E213 Hyperparathyroidism, unspecified: Secondary | ICD-10-CM | POA: Diagnosis not present

## 2022-03-14 DIAGNOSIS — I129 Hypertensive chronic kidney disease with stage 1 through stage 4 chronic kidney disease, or unspecified chronic kidney disease: Secondary | ICD-10-CM | POA: Diagnosis not present

## 2022-03-14 DIAGNOSIS — E663 Overweight: Secondary | ICD-10-CM | POA: Diagnosis not present

## 2022-03-14 DIAGNOSIS — Z6829 Body mass index (BMI) 29.0-29.9, adult: Secondary | ICD-10-CM | POA: Diagnosis not present

## 2022-03-14 DIAGNOSIS — N390 Urinary tract infection, site not specified: Secondary | ICD-10-CM | POA: Diagnosis not present

## 2022-03-14 DIAGNOSIS — Z8673 Personal history of transient ischemic attack (TIA), and cerebral infarction without residual deficits: Secondary | ICD-10-CM | POA: Diagnosis not present

## 2022-03-14 DIAGNOSIS — B965 Pseudomonas (aeruginosa) (mallei) (pseudomallei) as the cause of diseases classified elsewhere: Secondary | ICD-10-CM | POA: Diagnosis not present

## 2022-03-14 DIAGNOSIS — M109 Gout, unspecified: Secondary | ICD-10-CM | POA: Diagnosis not present

## 2022-03-14 DIAGNOSIS — F17210 Nicotine dependence, cigarettes, uncomplicated: Secondary | ICD-10-CM | POA: Diagnosis not present

## 2022-03-14 DIAGNOSIS — T83511D Infection and inflammatory reaction due to indwelling urethral catheter, subsequent encounter: Secondary | ICD-10-CM | POA: Diagnosis not present

## 2022-03-14 DIAGNOSIS — Z9181 History of falling: Secondary | ICD-10-CM | POA: Diagnosis not present

## 2022-03-14 DIAGNOSIS — K219 Gastro-esophageal reflux disease without esophagitis: Secondary | ICD-10-CM | POA: Diagnosis not present

## 2022-03-15 DIAGNOSIS — C61 Malignant neoplasm of prostate: Secondary | ICD-10-CM | POA: Diagnosis not present

## 2022-03-16 DIAGNOSIS — C61 Malignant neoplasm of prostate: Secondary | ICD-10-CM | POA: Diagnosis not present

## 2022-03-19 DIAGNOSIS — C61 Malignant neoplasm of prostate: Secondary | ICD-10-CM | POA: Diagnosis not present

## 2022-03-20 DIAGNOSIS — C61 Malignant neoplasm of prostate: Secondary | ICD-10-CM | POA: Diagnosis not present

## 2022-03-21 DIAGNOSIS — C61 Malignant neoplasm of prostate: Secondary | ICD-10-CM | POA: Diagnosis not present

## 2022-03-22 DIAGNOSIS — I10 Essential (primary) hypertension: Secondary | ICD-10-CM | POA: Diagnosis not present

## 2022-03-22 DIAGNOSIS — Z79899 Other long term (current) drug therapy: Secondary | ICD-10-CM | POA: Diagnosis not present

## 2022-03-22 DIAGNOSIS — E7800 Pure hypercholesterolemia, unspecified: Secondary | ICD-10-CM | POA: Diagnosis not present

## 2022-03-22 DIAGNOSIS — Z6825 Body mass index (BMI) 25.0-25.9, adult: Secondary | ICD-10-CM | POA: Diagnosis not present

## 2022-03-22 DIAGNOSIS — Z7689 Persons encountering health services in other specified circumstances: Secondary | ICD-10-CM | POA: Diagnosis not present

## 2022-03-22 DIAGNOSIS — C61 Malignant neoplasm of prostate: Secondary | ICD-10-CM | POA: Diagnosis not present

## 2022-03-22 DIAGNOSIS — Z8546 Personal history of malignant neoplasm of prostate: Secondary | ICD-10-CM | POA: Diagnosis not present

## 2022-03-22 DIAGNOSIS — R1032 Left lower quadrant pain: Secondary | ICD-10-CM | POA: Diagnosis not present

## 2022-03-22 DIAGNOSIS — R1031 Right lower quadrant pain: Secondary | ICD-10-CM | POA: Diagnosis not present

## 2022-03-23 DIAGNOSIS — C61 Malignant neoplasm of prostate: Secondary | ICD-10-CM | POA: Diagnosis not present

## 2022-03-26 DIAGNOSIS — C61 Malignant neoplasm of prostate: Secondary | ICD-10-CM | POA: Diagnosis not present

## 2022-03-27 DIAGNOSIS — F17210 Nicotine dependence, cigarettes, uncomplicated: Secondary | ICD-10-CM | POA: Diagnosis not present

## 2022-03-27 DIAGNOSIS — N390 Urinary tract infection, site not specified: Secondary | ICD-10-CM | POA: Diagnosis not present

## 2022-03-27 DIAGNOSIS — K219 Gastro-esophageal reflux disease without esophagitis: Secondary | ICD-10-CM | POA: Diagnosis not present

## 2022-03-27 DIAGNOSIS — Z6829 Body mass index (BMI) 29.0-29.9, adult: Secondary | ICD-10-CM | POA: Diagnosis not present

## 2022-03-27 DIAGNOSIS — E663 Overweight: Secondary | ICD-10-CM | POA: Diagnosis not present

## 2022-03-27 DIAGNOSIS — E7800 Pure hypercholesterolemia, unspecified: Secondary | ICD-10-CM | POA: Diagnosis not present

## 2022-03-27 DIAGNOSIS — I129 Hypertensive chronic kidney disease with stage 1 through stage 4 chronic kidney disease, or unspecified chronic kidney disease: Secondary | ICD-10-CM | POA: Diagnosis not present

## 2022-03-27 DIAGNOSIS — E213 Hyperparathyroidism, unspecified: Secondary | ICD-10-CM | POA: Diagnosis not present

## 2022-03-27 DIAGNOSIS — N1831 Chronic kidney disease, stage 3a: Secondary | ICD-10-CM | POA: Diagnosis not present

## 2022-03-27 DIAGNOSIS — B965 Pseudomonas (aeruginosa) (mallei) (pseudomallei) as the cause of diseases classified elsewhere: Secondary | ICD-10-CM | POA: Diagnosis not present

## 2022-03-27 DIAGNOSIS — T83511D Infection and inflammatory reaction due to indwelling urethral catheter, subsequent encounter: Secondary | ICD-10-CM | POA: Diagnosis not present

## 2022-03-27 DIAGNOSIS — E559 Vitamin D deficiency, unspecified: Secondary | ICD-10-CM | POA: Diagnosis not present

## 2022-03-27 DIAGNOSIS — M109 Gout, unspecified: Secondary | ICD-10-CM | POA: Diagnosis not present

## 2022-03-27 DIAGNOSIS — Z8673 Personal history of transient ischemic attack (TIA), and cerebral infarction without residual deficits: Secondary | ICD-10-CM | POA: Diagnosis not present

## 2022-03-27 DIAGNOSIS — Z9181 History of falling: Secondary | ICD-10-CM | POA: Diagnosis not present

## 2022-03-27 DIAGNOSIS — C61 Malignant neoplasm of prostate: Secondary | ICD-10-CM | POA: Diagnosis not present

## 2022-03-27 DIAGNOSIS — D631 Anemia in chronic kidney disease: Secondary | ICD-10-CM | POA: Diagnosis not present

## 2022-03-29 DIAGNOSIS — Z6825 Body mass index (BMI) 25.0-25.9, adult: Secondary | ICD-10-CM | POA: Diagnosis not present

## 2022-03-29 DIAGNOSIS — R1032 Left lower quadrant pain: Secondary | ICD-10-CM | POA: Diagnosis not present

## 2022-03-29 DIAGNOSIS — Z8546 Personal history of malignant neoplasm of prostate: Secondary | ICD-10-CM | POA: Diagnosis not present

## 2022-03-29 DIAGNOSIS — E7800 Pure hypercholesterolemia, unspecified: Secondary | ICD-10-CM | POA: Diagnosis not present

## 2022-03-29 DIAGNOSIS — Z79899 Other long term (current) drug therapy: Secondary | ICD-10-CM | POA: Diagnosis not present

## 2022-03-29 DIAGNOSIS — R030 Elevated blood-pressure reading, without diagnosis of hypertension: Secondary | ICD-10-CM | POA: Diagnosis not present

## 2022-03-29 DIAGNOSIS — R1031 Right lower quadrant pain: Secondary | ICD-10-CM | POA: Diagnosis not present

## 2022-03-29 DIAGNOSIS — I10 Essential (primary) hypertension: Secondary | ICD-10-CM | POA: Diagnosis not present

## 2022-04-05 DIAGNOSIS — C61 Malignant neoplasm of prostate: Secondary | ICD-10-CM | POA: Diagnosis not present

## 2022-04-10 ENCOUNTER — Ambulatory Visit: Payer: Self-pay

## 2022-04-10 DIAGNOSIS — E213 Hyperparathyroidism, unspecified: Secondary | ICD-10-CM | POA: Diagnosis not present

## 2022-04-10 DIAGNOSIS — F17210 Nicotine dependence, cigarettes, uncomplicated: Secondary | ICD-10-CM | POA: Diagnosis not present

## 2022-04-10 DIAGNOSIS — D631 Anemia in chronic kidney disease: Secondary | ICD-10-CM | POA: Diagnosis not present

## 2022-04-10 DIAGNOSIS — E7800 Pure hypercholesterolemia, unspecified: Secondary | ICD-10-CM | POA: Diagnosis not present

## 2022-04-10 DIAGNOSIS — K219 Gastro-esophageal reflux disease without esophagitis: Secondary | ICD-10-CM | POA: Diagnosis not present

## 2022-04-10 DIAGNOSIS — M109 Gout, unspecified: Secondary | ICD-10-CM | POA: Diagnosis not present

## 2022-04-10 DIAGNOSIS — Z9181 History of falling: Secondary | ICD-10-CM | POA: Diagnosis not present

## 2022-04-10 DIAGNOSIS — I129 Hypertensive chronic kidney disease with stage 1 through stage 4 chronic kidney disease, or unspecified chronic kidney disease: Secondary | ICD-10-CM | POA: Diagnosis not present

## 2022-04-10 DIAGNOSIS — B965 Pseudomonas (aeruginosa) (mallei) (pseudomallei) as the cause of diseases classified elsewhere: Secondary | ICD-10-CM | POA: Diagnosis not present

## 2022-04-10 DIAGNOSIS — E663 Overweight: Secondary | ICD-10-CM | POA: Diagnosis not present

## 2022-04-10 DIAGNOSIS — N1831 Chronic kidney disease, stage 3a: Secondary | ICD-10-CM | POA: Diagnosis not present

## 2022-04-10 DIAGNOSIS — N390 Urinary tract infection, site not specified: Secondary | ICD-10-CM | POA: Diagnosis not present

## 2022-04-10 DIAGNOSIS — C61 Malignant neoplasm of prostate: Secondary | ICD-10-CM | POA: Diagnosis not present

## 2022-04-10 DIAGNOSIS — Z6829 Body mass index (BMI) 29.0-29.9, adult: Secondary | ICD-10-CM | POA: Diagnosis not present

## 2022-04-10 DIAGNOSIS — E559 Vitamin D deficiency, unspecified: Secondary | ICD-10-CM | POA: Diagnosis not present

## 2022-04-10 DIAGNOSIS — Z8673 Personal history of transient ischemic attack (TIA), and cerebral infarction without residual deficits: Secondary | ICD-10-CM | POA: Diagnosis not present

## 2022-04-10 DIAGNOSIS — T83511D Infection and inflammatory reaction due to indwelling urethral catheter, subsequent encounter: Secondary | ICD-10-CM | POA: Diagnosis not present

## 2022-04-10 NOTE — Telephone Encounter (Signed)
Todd Craig is calling in regard to patient having pain in between the legs, pain when he is sitting down, and pain close to his growing. I offered an appointment to see Todd Craig this week but Todd Craig declined coming to the office this week and states that if it gets too bad she can take the patient to the hospital. Todd Craig did accept the appointment for 04/17/2022 at 1:20pm.   Chief Complaint: Scrotal discomfort Symptoms: Can't get comfortable Frequency: Weeks Pertinent Negatives: Patient denies any other symptoms. Disposition: '[]'$ ED /'[]'$ Urgent Care (no appt availability in office) / '[]'$ Appointment(In office/virtual)/ '[]'$  [facility]/ '[]'$ Home Care/ '[]'$ Refused Recommended Disposition /'[]'$ [facility]/ '[x]'$  Follow-up with PCP Additional Notes: Has appointment 04/17/21. Will call for worsening of symptoms  Answer Assessment - Initial Assessment Questions 1. LOCATION and RADIATION: "Where is the pain located?"      Groin 2. QUALITY: "What does the pain feel like?"  (e.g., sharp, dull, aching, burning)     Ach 3. SEVERITY: "How bad is the pain?"  (Scale 1-10; or mild, moderate, severe)   - MILD (1-3): doesn't interfere with normal activities    - MODERATE (4-7): interferes with normal activities (e.g., work or school) or awakens from sleep   - SEVERE (8-10): excruciating pain, unable to do any normal activities, difficulty walking     Moderate 4. ONSET: "When did the pain start?"     After the catheter was taken out 5. PATTERN: "Does it come and go, or has it been constant since it started?"     Comes and goes 6. SCROTAL APPEARANCE: "What does the scrotum look like?" "Is there any swelling or redness?"      No 7. HERNIA: "Has a doctor ever told you that you have a hernia?"     No 8. OTHER SYMPTOMS: "Do you have any other symptoms?" (e.g., abdomen pain, difficulty passing urine, fever, vomiting)     No  Protocols used: Scrotum Pain-A-AH

## 2022-04-15 NOTE — Patient Instructions (Signed)
Adductor Muscle Strain  An adductor muscle strain, also called a groin strain or pull, is an injury to the muscles or tendons on the upper, inner part of the thigh. These muscles are called the adductor or groin muscles. They are responsible for moving the legs across the body or pulling the legs together. A muscle strain occurs when a muscle is overstretched and some muscle fibers are torn. The severity of an adductor muscle strain is rated as Grade 1, 2, or 3. A Grade 3 strain has the most tearing and pain. What are the causes? Adductor muscle strains usually occur during exercise or while participating in sports. This condition may be caused by: A sudden, violent force placed on the muscle, stretching it too far. Stretching the muscles too far or too suddenly, often during side-to-side motion with a sudden change in direction. Putting repeated stress on the adductor muscles over a long period of time. Performing vigorous activity without properly stretching or warming up the adductor muscles beforehand. Not being properly conditioned. What are the signs or symptoms? Symptoms of this condition include: Pain and tenderness in the groin area. This begins as sharp pain and persists as a dull ache. A popping or snapping feeling when the injury occurs (for severe strains). Swelling or bruising. Muscle spasms. Weakness in the leg. Stiffness in the groin area with decreased ability to move the affected muscles. How is this diagnosed? This condition may be diagnosed based on: A physical exam. Your medical history. How well you can do certain range of motion exercises. Imaging tests, such as MRI, ultrasound, or X-rays. Your strain may be rated based on how severe it is. The ratings are: Grade 1 strain (mild). Muscles are overstretched. There may be very small muscle tears. This type of strain generally heals in about one week. Grade 2 strain (moderate). Muscles are partially torn. This may take  one to two months to heal. Grade 3 strain (severe). Muscles are completely torn. A severe strain can take more than three months to heal. Grade 3 gluteal strains are rare. How is this treated? An adductor strain will often heal on its own. If needed, this condition may be treated with: PRICE therapy. PRICE stands for protection of the injured area, rest, ice, pressure (compression), and elevation. Medicines to help manage pain and swelling (anti-inflammatory medicines). Crutches. You may be directed to use these for the first few days to minimize your pain. Depending on the severity of the muscle strain, recovery time may vary from a few weeks to several months. Severe injuries often require 4-6 weeks for recovery. In those cases, complete healing can take 4-5 months. Follow these instructions at home: PRICE Therapy  Protect the muscle from being injured again. Rest. Do not use the strained muscle if it causes pain. If directed, put ice on the injured area: Put ice in a plastic bag. Place a towel between your skin and the bag. Leave the ice on for 20 minutes, 2-3 times a day. Do this for the first 2 days after the injury. Apply compression by wrapping the injured area with an elastic bandage as told by your health care provider. Raise (elevate) the injured area above the level of your heart while you are sitting or lying down. Activity Do not drive or use heavy machinery while taking prescription pain medicine. Walk, stretch, and do exercises as told by your health care provider. Only do these activities if you can do so without any pain. General instructions   Take over-the-counter and prescription medicines only as told by your health care provider. Follow your treatment plan as told by your health care provider. This may include: Physical therapy. Massage. Local electrical stimulation (transcutaneous electrical nerve stimulation, TENS). Keep all follow-up visits. This is important. How  is this prevented? Warm up and stretch before being active. Cool down and stretch after being active. Give your body time to rest between periods of activity. Make sure to use equipment that fits you. Be safe and responsible while being active to avoid slips and falls. Maintain physical fitness, including: Proper conditioning in the adductor muscles. Overall strength, flexibility, and endurance. Contact a health care provider if: You have increased pain or swelling in the affected area. Your symptoms are not improving or they are getting worse. Summary An adductor muscle strain, also called a groin strain or pull, is an injury to the muscles or tendons on the upper, inner part of the thigh. A muscle strain occurs when a muscle is overstretched and some muscle fibers are torn. Depending on the severity of the muscle strain, recovery time may vary from a few weeks to several months. This information is not intended to replace advice given to you by your health care provider. Make sure you discuss any questions you have with your health care provider. Document Revised: 08/31/2020 Document Reviewed: 08/31/2020 Elsevier Patient Education  2023 [employer]vier Inc.  

## 2022-04-17 ENCOUNTER — Ambulatory Visit: Payer: Medicaid Other | Admitting: Nurse Practitioner

## 2022-04-17 ENCOUNTER — Encounter: Payer: Self-pay | Admitting: Nurse Practitioner

## 2022-04-17 VITALS — BP 135/90 | HR 83 | Temp 98.4°F | Ht 74.49 in | Wt 197.7 lb

## 2022-04-17 DIAGNOSIS — G8929 Other chronic pain: Secondary | ICD-10-CM | POA: Diagnosis not present

## 2022-04-17 DIAGNOSIS — M5441 Lumbago with sciatica, right side: Secondary | ICD-10-CM | POA: Diagnosis not present

## 2022-04-17 DIAGNOSIS — M5442 Lumbago with sciatica, left side: Secondary | ICD-10-CM

## 2022-04-17 MED ORDER — LOSARTAN POTASSIUM 100 MG PO TABS: 100.0000 mg | ORAL_TABLET | Freq: Every day | ORAL | 4 refills | Status: DC

## 2022-04-17 MED ORDER — TIZANIDINE HCL 2 MG PO TABS: 2.0000 mg | ORAL_TABLET | Freq: Four times a day (QID) | ORAL | 0 refills | Status: DC | PRN

## 2022-04-17 MED ORDER — COLCHICINE 0.6 MG PO TABS: 0.6000 mg | ORAL_TABLET | ORAL | 12 refills | Status: DC | PRN

## 2022-04-17 MED ORDER — AMLODIPINE BESYLATE 10 MG PO TABS: 10.0000 mg | ORAL_TABLET | Freq: Every day | ORAL | 4 refills | Status: DC

## 2022-04-17 MED ORDER — ROSUVASTATIN CALCIUM 20 MG PO TABS: 20.0000 mg | ORAL_TABLET | Freq: Every day | ORAL | 4 refills | Status: DC

## 2022-04-17 MED ORDER — PREDNISONE 10 MG PO TABS: ORAL_TABLET | ORAL | 0 refills | Status: DC

## 2022-04-17 NOTE — Assessment & Plan Note (Signed)
Ongoing for several months -- discomfort down into buttocks bilaterally, ?sciatic -- history of degenerative disc on past imaging 2013.  At this time will trial Prednisone taper + Tizanidine.  Recommend alternate heat and ice + ensure plenty of rest and gentle stretching.  Will obtain imaging lumbar spine to recheck..  May take Tylenol as needed.  Plan on return in 2 weeks for reassessment -- consider PT if ongoing pain and possible ortho referral.

## 2022-04-17 NOTE — Progress Notes (Signed)
BP (!) 135/90   Pulse 83   Temp 98.4 F (36.9 C)   Ht 6' 2.49" (1.892 m)   Wt 197 lb 11.2 oz (89.7 kg)   SpO2 95%   BMI 25.05 kg/m    Subjective:    Patient ID: Todd Craig, male    DOB: 06-24-1960, 62 y.o.   MRN: [id number]  HPI: Todd Craig is a 62 y.o. male  Chief Complaint  Patient presents with   Pain    Buttock and b/l thigh pain since November the removal of the cath he had placed for surgery. Can hardly sit down.   BUTTOCK AND THIGH PAIN Presents for pain around his rear end, was in thigh area, but now in his bottom.  This has been ongoing since November when he had catheter removed.  If he sits slouched in a hard chair he is okay.  Has lost 33 pounds over past year.  Has history of imaging of lower back in 2013 degenerative disc L3 and S1. Duration: months Mechanism of injury: unknown Location:  buttocks and bilateral -- cheeks Onset: gradual Severity: 9/10 Quality: sharp, aching, and throbbing Frequency: intermittent Radiation: buttocks Aggravating factors: prolonged sitting Alleviating factors:  nothing Status: fluctuating Treatments attempted: Tylenol, Oxycodone, getting up and moving  Relief with NSAIDs?: No NSAIDs Taken Nighttime pain:  no Paresthesias / decreased sensation:  no Bowel / bladder incontinence:  no Fevers:  no Dysuria / urinary frequency:  no   Relevant past medical, surgical, family and social history reviewed and updated as indicated. Interim medical history since our last visit reviewed. Allergies and medications reviewed and updated.  Review of Systems  Constitutional:  Negative for activity change, diaphoresis, fatigue and fever.  Respiratory:  Negative for cough, chest tightness, shortness of breath and wheezing.   Cardiovascular:  Negative for chest pain, palpitations and leg swelling.  Gastrointestinal: Negative.   Musculoskeletal:  Positive for arthralgias and back pain.  Neurological: Negative.   Psychiatric/Behavioral:  Negative.      Per HPI unless specifically indicated above     Objective:    BP (!) 135/90   Pulse 83   Temp 98.4 F (36.9 C)   Ht 6' 2.49" (1.892 m)   Wt 197 lb 11.2 oz (89.7 kg)   SpO2 95%   BMI 25.05 kg/m   Wt Readings from Last 3 Encounters:  04/17/22 197 lb 11.2 oz (89.7 kg)  02/19/22 202 lb (91.6 kg)  01/31/22 202 lb (91.6 kg)    Physical Exam Vitals and nursing note reviewed.  Constitutional:      General: He is awake. He is not in acute distress.    Appearance: He is well-developed and well-groomed. He is not ill-appearing or toxic-appearing.  HENT:     Head: Normocephalic.  Eyes:     General: Lids are normal.     Extraocular Movements: Extraocular movements intact.     Conjunctiva/sclera: Conjunctivae normal.  Neck:     Thyroid: No thyromegaly.     Vascular: No carotid bruit.  Cardiovascular:     Rate and Rhythm: Normal rate and regular rhythm.     Heart sounds: Normal heart sounds.  Pulmonary:     Effort: No accessory muscle usage or respiratory distress.     Breath sounds: Normal breath sounds.  Abdominal:     General: Bowel sounds are normal. There is no distension.     Palpations: Abdomen is soft.     Tenderness: There is no abdominal tenderness.  Musculoskeletal:     Cervical back: Full passive range of motion without pain.     Lumbar back: Tenderness present. No edema or spasms. Decreased range of motion (flexion and extension mild decrease). Negative right straight leg raise test and negative left straight leg raise test. No scoliosis.     Right lower leg: No edema.     Left lower leg: No edema.  Lymphadenopathy:     Cervical: No cervical adenopathy.  Skin:    General: Skin is warm.     Capillary Refill: Capillary refill takes less than 2 seconds.  Neurological:     Mental Status: He is alert and oriented to person, place, and time.     Deep Tendon Reflexes: Reflexes are normal and symmetric.     Reflex Scores:      Brachioradialis reflexes  are 2+ on the right side and 2+ on the left side.      Patellar reflexes are 2+ on the right side and 2+ on the left side. Psychiatric:        Attention and Perception: Attention normal.        Mood and Affect: Mood normal.        Speech: Speech normal.        Behavior: Behavior normal. Behavior is cooperative.        Thought Content: Thought content normal.     Results for orders placed or performed during the hospital encounter of 02/19/22  Urine Culture   Specimen: Urine, Clean Catch  Result Value Ref Range   Specimen Description      URINE, CLEAN CATCH Performed at [facility], [address] [place], [place] [address]    Special Requests      NONE Performed at [facility], [address] [place], [place] [address]    Culture (A)     >=100,000 COLONIES/mL ENTEROCOCCUS FAECIUM VANCOMYCIN RESISTANT ENTEROCOCCUS >=100,000 COLONIES/mL YEAST    Report Status 02/24/2022 FINAL    Organism ID, Bacteria ENTEROCOCCUS FAECIUM (A)       Susceptibility   Enterococcus faecium - MIC*    AMPICILLIN >=32 RESISTANT Resistant     NITROFURANTOIN 128 RESISTANT Resistant     VANCOMYCIN >=32 RESISTANT Resistant     LINEZOLID 2 SENSITIVE Sensitive     * >=100,000 COLONIES/mL ENTEROCOCCUS FAECIUM  Basic metabolic panel  Result Value Ref Range   Sodium 140 135 - 145 mmol/L   Potassium 4.6 3.5 - 5.1 mmol/L   Chloride 114 (H) 98 - 111 mmol/L   CO2 22 22 - 32 mmol/L   Glucose, Bld 123 (H) 70 - 99 mg/dL   BUN 16 6 - 20 mg/dL   Creatinine, Ser 1.39 (H) 0.61 - 1.24 mg/dL   Calcium 10.0 8.9 - 10.3 mg/dL   GFR, Estimated 58 (L) >60 mL/min   Anion gap 4 (L) 5 - 15  CBC  Result Value Ref Range   WBC 15.3 (H) 4.0 - 10.5 K/uL   RBC 3.67 (L) 4.22 - 5.81 MIL/uL   Hemoglobin 10.9 (L) 13.0 - 17.0 g/dL   HCT 34.9 (L) 39.0 - 52.0 %   MCV 95.1 80.0 - 100.0 fL   MCH 29.7 26.0 - 34.0 pg   MCHC 31.2 30.0 - 36.0 g/dL   RDW 14.9 11.5 - 15.5 %   Platelets 352 150 - 400 K/uL    nRBC 0.0 0.0 - 0.2 %  Urinalysis, Routine w reflex microscopic Urine, Unspecified Source  Result Value Ref  Range   Color, Urine YELLOW (A) YELLOW   APPearance HAZY (A) CLEAR   Specific Gravity, Urine 1.014 1.005 - 1.030   pH 5.0 5.0 - 8.0   Glucose, UA NEGATIVE NEGATIVE mg/dL   Hgb urine dipstick SMALL (A) NEGATIVE   Bilirubin Urine NEGATIVE NEGATIVE   Ketones, ur NEGATIVE NEGATIVE mg/dL   Protein, ur 30 (A) NEGATIVE mg/dL   Nitrite NEGATIVE NEGATIVE   Leukocytes,Ua SMALL (A) NEGATIVE   RBC / HPF 21-50 0 - 5 RBC/hpf   WBC, UA 21-50 0 - 5 WBC/hpf   Bacteria, UA RARE (A) NONE SEEN   Squamous Epithelial / LPF NONE SEEN 0 - 5   Mucus PRESENT    Budding Yeast PRESENT   Troponin I (High Sensitivity)  Result Value Ref Range   Troponin I (High Sensitivity) 13 <18 ng/L  Troponin I (High Sensitivity)  Result Value Ref Range   Troponin I (High Sensitivity) 13 <18 ng/L      Assessment & Plan:   Problem List Items Addressed This Visit       Nervous and Auditory   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  At this time will trial Prednisone taper + Tizanidine.  Recommend alternate heat and ice + ensure plenty of rest and gentle stretching.  Will obtain imaging lumbar spine to recheck..  May take Tylenol as needed.  Plan on return in 2 weeks for reassessment -- consider PT if ongoing pain and possible ortho referral.      Relevant Medications   predniSONE (DELTASONE) 10 MG tablet   tiZANidine (ZANAFLEX) 2 MG tablet   Other Relevant Orders   DG Lumbar Spine Complete     Follow up plan: Return in about 2 weeks (around 05/01/2022) for Back Pain.

## 2022-04-19 ENCOUNTER — Ambulatory Visit
Admission: RE | Admit: 2022-04-19 | Discharge: 2022-04-19 | Disposition: A | Discharge status: Home / Self Care | Payer: Medicaid Other | Source: Ambulatory Visit | Attending: Nurse Practitioner | Admitting: Nurse Practitioner

## 2022-04-19 ENCOUNTER — Ambulatory Visit
Admission: RE | Admit: 2022-04-19 | Discharge: 2022-04-19 | Disposition: A | Discharge status: Home / Self Care | Payer: Medicaid Other | Attending: Nurse Practitioner | Admitting: Nurse Practitioner

## 2022-04-19 DIAGNOSIS — M5441 Lumbago with sciatica, right side: Secondary | ICD-10-CM | POA: Diagnosis not present

## 2022-04-19 DIAGNOSIS — M545 Low back pain, unspecified: Secondary | ICD-10-CM | POA: Diagnosis not present

## 2022-04-19 DIAGNOSIS — G8929 Other chronic pain: Secondary | ICD-10-CM

## 2022-04-19 DIAGNOSIS — M5442 Lumbago with sciatica, left side: Secondary | ICD-10-CM | POA: Diagnosis not present

## 2022-04-20 NOTE — Progress Notes (Signed)
Good day, please let Todd Craig and his significant other know he does have arthritic changes specifically to lower aspect low back.  Suspect pain coming from this.  Continue current treatment and if ongoing we will consider physical therapy. Any questions?

## 2022-04-27 DIAGNOSIS — I10 Essential (primary) hypertension: Secondary | ICD-10-CM | POA: Diagnosis not present

## 2022-04-27 DIAGNOSIS — Z6825 Body mass index (BMI) 25.0-25.9, adult: Secondary | ICD-10-CM | POA: Diagnosis not present

## 2022-04-27 DIAGNOSIS — Z8546 Personal history of malignant neoplasm of prostate: Secondary | ICD-10-CM | POA: Diagnosis not present

## 2022-04-27 DIAGNOSIS — R1032 Left lower quadrant pain: Secondary | ICD-10-CM | POA: Diagnosis not present

## 2022-04-27 DIAGNOSIS — E7800 Pure hypercholesterolemia, unspecified: Secondary | ICD-10-CM | POA: Diagnosis not present

## 2022-04-27 DIAGNOSIS — R1031 Right lower quadrant pain: Secondary | ICD-10-CM | POA: Diagnosis not present

## 2022-04-27 DIAGNOSIS — Z79899 Other long term (current) drug therapy: Secondary | ICD-10-CM | POA: Diagnosis not present

## 2022-04-29 NOTE — Patient Instructions (Incomplete)
Managing Chronic Back Pain Chronic back pain is back pain that lasts for 12 weeks or longer. It often affects the lower back. Back pain may feel like a muscle ache or a sharp, stabbing pain. It can be mild, moderate, or severe. If you have been diagnosed with chronic back pain, there are things you can do to manage your symptoms. You may have to try different things to see what works best for you. Your health care provider may also give you specific instructions. How to manage lifestyle changes Treating chronic back pain often starts with rest and pain relief, followed by exercises to restore movement and strength to your back (physical therapy). You may need surgery if other treatments do not help, or if your pain is caused by a condition or an injury. Follow your treatment plan as told by your health care provider. This may include: Relaxation techniques. Talk therapy or counseling with a mental health specialist. A form of talk therapy called cognitive behavioral therapy (CBT) can be especially helpful. This therapy helps you set goals and follow up on the changes that you make. Acupuncture or massage therapy. Local electrical stimulation. Injections. These deliver numbing or pain-relieving medicines into your spine or the area of pain. How to recognize changes in your chronic back pain Your condition may improve with treatment. However, back pain may not go away or may get worse over time. Watch your symptoms carefully and let your health care provider know if your symptoms get worse or do not improve. Your back pain may be getting worse if you have: Pain that begins to cause problems with posture. Pain that gets worse when you are sitting, standing, walking, bending, or lifting. Pain that affects you while you are active, or at rest, or both. Pain that eventually makes it hard to move around (limits mobility). Pain that occurs with fever, weight loss, or difficulty urinating. Pain that causes  numbness and tingling. How to use body mechanics and posture to help with pain Healthy body mechanics and good posture can help to relieve stress on your back. Body mechanics refers to the movements and positions of your body during your daily activities. Posture is part of body mechanics. Good posture means: Your spine is in its natural S-curve, or neutral, position. Your shoulders are pulled back slightly. Your head is not tipped forward. Follow these guidelines to improve your posture and body mechanics in your everyday activities. Standing  When standing, keep your spine neutral and your feet about hip-width apart. Keep your knees slightly bent. Your ears, shoulders, and hips should line up. When you do a task in which you stand in one place for a long time, place one foot on a stable object that is 2-4 inches (5-10 cm) high, such as a footstool. This helps keep your spine neutral. Sitting  When sitting, keep your spine neutral and your feet flat on the floor. Use a footrest, if necessary, and keep your thighs parallel to the floor. Avoid rounding your shoulders, and avoid tilting your head forward. When working at a desk or a computer, keep your desk at a height where your hands are slightly lower than your elbows. Slide your chair under your desk so you are close enough to maintain good posture. When working at a computer, place your monitor at a height where you are looking straight ahead and you do not have to tilt your head forward or downward to view the screen. Lifting  Keep your feet at  least shoulder-width apart and tighten the muscles of your abdomen. Bend your knees and hips and keep your spine neutral. Be sure to lift using the strength of your legs, not your back. Do not lock your knees straight out. Always ask for help to lift heavy or awkward objects. Resting  When lying down and resting, avoid positions that are most painful. If you have pain with activities such as  sitting, bending, stooping, or squatting, lie in a position in which your body does not bend very much. For example, avoid curling up on your side with your arms and knees near your chest (fetal position). If you have pain with activities such as standing for a long time or reaching with your arms, lie with your spine in a neutral position and bend your knees slightly. Try: Lying on your side with a pillow between your knees. Lying on your back with a pillow under your knees. Follow these instructions at home: Medicines Treatment may include over-the-counter or prescription medicines for pain and inflammation that are taken by mouth or applied to the skin. Another treatment may include muscle relaxants. Take over-the-counter and prescription medicines only as told by your health care provider. Ask your health care provider if the medicine prescribed to you: Requires you to avoid driving or using machinery. Can cause constipation. You may need to take these actions to prevent or treat constipation: Drink enough fluid to keep your urine pale yellow. Take over-the-counter or prescription medicines. Eat foods that are high in fiber, such as beans, whole grains, and fresh fruits and vegetables. Limit foods that are high in fat and processed sugars, such as fried or sweet foods. Lifestyle Do not use any products that contain nicotine or tobacco, such as cigarettes, e-cigarettes, and chewing tobacco. If you need help quitting, ask your health care provider. Eat a healthy diet that includes foods such as vegetables, fruits, fish, and lean meats. Work with your health care provider to achieve or maintain a healthy weight. General instructions Get regular exercise as told. Exercise improves flexibility and strength. If physical therapy was prescribed, do exercises as told by your health care provider. Use ice or heat therapy as told by your health care provider. Keep all follow-up visits as told by your  health care provider. This is important. Where can I get support? Consider joining a support group for people managing chronic back pain. Ask your health care provider about support groups in your area. You can also find online and in-person support groups through: The American Chronic Pain Association: theacpa.org Pain Connection Program: painconnection.org Contact a health care provider if: You have pain that is not relieved with rest or medicine. Your pain gets worse, or you have new pain. You have a fever. You have rapid weight loss. You have trouble doing your normal activities. Get help right away if: You have weakness or numbness in one or both of your legs or feet. You have trouble controlling your bladder or your bowels. You have severe back pain and have any of the following: Nausea or vomiting. Abdominal pain. Shortness of breath or you faint. Summary Chronic back pain is often treated with rest, pain relief, and physical therapy. Talk therapy, acupuncture, massage, and local electrical stimulation may help. Follow your treatment plan as told by your health care provider. Joining a support group may help you manage chronic back pain. This information is not intended to replace advice given to you by your health care provider. Make sure you  discuss any questions you have with your health care provider. Document Revised: 05/14/2019 Document Reviewed: 01/20/2019 Elsevier Patient Education  [place].

## 2022-04-30 ENCOUNTER — Ambulatory Visit: Payer: Medicaid Other | Admitting: Neurology

## 2022-05-01 ENCOUNTER — Ambulatory Visit: Payer: Medicaid Other | Admitting: Nurse Practitioner

## 2022-05-01 DIAGNOSIS — Z79899 Other long term (current) drug therapy: Secondary | ICD-10-CM | POA: Diagnosis not present

## 2022-05-19 NOTE — Patient Instructions (Signed)
Acute Back Pain, Adult Acute back pain is sudden and usually short-lived. It is often caused by an injury to the muscles and tissues in the back. The injury may result from: A muscle, tendon, or ligament getting overstretched or torn. Ligaments are tissues that connect bones to each other. Lifting something improperly can cause a back strain. Wear and tear (degeneration) of the spinal disks. Spinal disks are circular tissue that provide cushioning between the bones of the spine (vertebrae). Twisting motions, such as while playing sports or doing yard work. A hit to the back. Arthritis. You may have a physical exam, lab tests, and imaging tests to find the cause of your pain. Acute back pain usually goes away with rest and home care. Follow these instructions at home: Managing pain, stiffness, and swelling Take over-the-counter and prescription medicines only as told by your health care provider. Treatment may include medicines for pain and inflammation that are taken by mouth or applied to the skin, or muscle relaxants. Your health care provider may recommend applying ice during the first 24-48 hours after your pain starts. To do this: Put ice in a plastic bag. Place a towel between your skin and the bag. Leave the ice on for 20 minutes, 2-3 times a day. Remove the ice if your skin turns bright red. This is very important. If you cannot feel pain, heat, or cold, you have a greater risk of damage to the area. If directed, apply heat to the affected area as often as told by your health care provider. Use the heat source that your health care provider recommends, such as a moist heat pack or a heating pad. Place a towel between your skin and the heat source. Leave the heat on for 20-30 minutes. Remove the heat if your skin turns bright red. This is especially important if you are unable to feel pain, heat, or cold. You have a greater risk of getting burned. Activity  Do not stay in bed. Staying in  bed for more than 1-2 days can delay your recovery. Sit up and stand up straight. Avoid leaning forward when you sit or hunching over when you stand. If you work at a desk, sit close to it so you do not need to lean over. Keep your chin tucked in. Keep your neck drawn back, and keep your elbows bent at a 90-degree angle (right angle). Sit high and close to the steering wheel when you drive. Add lower back (lumbar) support to your car seat, if needed. Take short walks on even surfaces as soon as you are able. Try to increase the length of time you walk each day. Do not sit, drive, or stand in one place for more than 30 minutes at a time. Sitting or standing for long periods of time can put stress on your back. Do not drive or use heavy machinery while taking prescription pain medicine. Use proper lifting techniques. When you bend and lift, use positions that put less stress on your back: Bend your knees. Keep the load close to your body. Avoid twisting. Exercise regularly as told by your health care provider. Exercising helps your back heal faster and helps prevent back injuries by keeping muscles strong and flexible. Work with a physical therapist to make a safe exercise program, as recommended by your health care provider. Do any exercises as told by your physical therapist. Lifestyle Maintain a healthy weight. Extra weight puts stress on your back and makes it difficult to have good   posture. Avoid activities or situations that make you feel anxious or stressed. Stress and anxiety increase muscle tension and can make back pain worse. Learn ways to manage anxiety and stress, such as through exercise. General instructions Sleep on a firm mattress in a comfortable position. Try lying on your side with your knees slightly bent. If you lie on your back, put a pillow under your knees. Keep your head and neck in a straight line with your spine (neutral position) when using electronic equipment like  smartphones or pads. To do this: Raise your smartphone or pad to look at it instead of bending your head or neck to look down. Put the smartphone or pad at the level of your face while looking at the screen. Follow your treatment plan as told by your health care provider. This may include: Cognitive or behavioral therapy. Acupuncture or massage therapy. Meditation or yoga. Contact a health care provider if: You have pain that is not relieved with rest or medicine. You have increasing pain going down into your legs or buttocks. Your pain does not improve after 2 weeks. You have pain at night. You lose weight without trying. You have a fever or chills. You develop nausea or vomiting. You develop abdominal pain. Get help right away if: You develop new bowel or bladder control problems. You have unusual weakness or numbness in your arms or legs. You feel faint. These symptoms may represent a serious problem that is an emergency. Do not wait to see if the symptoms will go away. Get medical help right away. Call your local emergency services (911 in the [place] Do not drive yourself to the hospital. Summary Acute back pain is sudden and usually short-lived. Use proper lifting techniques. When you bend and lift, use positions that put less stress on your back. Take over-the-counter and prescription medicines only as told by your health care provider, and apply heat or ice as told. This information is not intended to replace advice given to you by your health care provider. Make sure you discuss any questions you have with your health care provider. Document Revised: 06/24/2020 Document Reviewed: 06/24/2020 Elsevier Patient Education  2023 Elsevier Inc.  

## 2022-05-22 ENCOUNTER — Telehealth: Payer: Self-pay | Admitting: Nurse Practitioner

## 2022-05-22 ENCOUNTER — Encounter: Payer: Self-pay | Admitting: Nurse Practitioner

## 2022-05-22 ENCOUNTER — Ambulatory Visit (INDEPENDENT_AMBULATORY_CARE_PROVIDER_SITE_OTHER): Payer: Medicaid Other | Admitting: Nurse Practitioner

## 2022-05-22 VITALS — BP 138/86 | HR 84 | Temp 98.4°F | Ht 74.49 in | Wt 203.4 lb

## 2022-05-22 DIAGNOSIS — M5442 Lumbago with sciatica, left side: Secondary | ICD-10-CM | POA: Diagnosis not present

## 2022-05-22 DIAGNOSIS — G8929 Other chronic pain: Secondary | ICD-10-CM

## 2022-05-22 DIAGNOSIS — M5441 Lumbago with sciatica, right side: Secondary | ICD-10-CM

## 2022-05-22 MED ORDER — LIDOCAINE 5 % EX PTCH: 1.0000 | MEDICATED_PATCH | CUTANEOUS | 0 refills | Status: DC

## 2022-05-22 MED ORDER — HYDROCODONE-ACETAMINOPHEN 5-325 MG PO TABS: 1.0000 | ORAL_TABLET | Freq: Four times a day (QID) | ORAL | 0 refills | Status: DC | PRN

## 2022-05-22 NOTE — Addendum Note (Signed)
Addended by: Marnee Guarneri T on: 05/22/2022 03:27 PM   Modules accepted: Orders

## 2022-05-22 NOTE — Progress Notes (Signed)
BP 138/86 (BP Location: Left Arm, Patient Position: Sitting, Cuff Size: Normal)   Pulse 84   Temp 98.4 F (36.9 C) (Oral)   Ht 6' 2.49" (1.892 m)   Wt 203 lb 6.4 oz (92.3 kg)   SpO2 96%   BMI 25.77 kg/m    Subjective:    Patient ID: Todd Craig, male    DOB: 20-Sep-1960, 62 y.o.   MRN: [id number]  HPI: Todd Craig is a 62 y.o. male  Chief Complaint  Patient presents with   Back Pain    Here for follow up   [place].  ASSESSMENT AND PLAN OF CARE REVIEWED WITH STUDENT, AGREE WITH ABOVE FINDINGS AND PLAN.   BACK PAIN Follow-up today for back pain initially seen 04/17/22.  Provided Prednisone and Tizanidine at the time.  Imaging noted degenerative disc most pronounced L4-5 and L5-S1.  Pt presents for f/u on back pain. Pt reports back pain remains severe at an 8/10 and denies relief from prednisone taper and tizanidine. Pt reports he feels the pain is getting worse and the only thing that makes it slightly better is walking.  Duration: months Mechanism of injury: unknown Location: bilateral and low back Onset: gradual Severity: 8/10 Quality: aching and throbbing Frequency: intermittent Radiation: R leg above the knee and L leg above the knee Aggravating factors: prolonged sitting Alleviating factors:  walking can sometimes relieve the pain Status: worse Treatments attempted: rest, ice, and heat  Relief with NSAIDs?: No NSAIDs Taken Nighttime pain:  no Paresthesias / decreased sensation:  no Bowel / bladder incontinence:  no Fevers:  no Dysuria / urinary frequency:  no   Relevant past medical, surgical, family and social history reviewed and updated as indicated. Interim medical history since our last visit reviewed. Allergies and medications reviewed and updated.  Review of Systems  Constitutional:  Negative for appetite change and fever.  HENT:  Negative for congestion.   Respiratory:  Negative for chest tightness, shortness of breath and wheezing.    Cardiovascular:  Negative for chest pain, palpitations and leg swelling.  Genitourinary:  Negative for difficulty urinating.  Musculoskeletal:  Positive for back pain.  Neurological:  Negative for headaches.  Psychiatric/Behavioral:  Negative for agitation, behavioral problems, confusion, self-injury, sleep disturbance and suicidal ideas. The patient is not nervous/anxious.     Per HPI unless specifically indicated above     Objective:    BP 138/86 (BP Location: Left Arm, Patient Position: Sitting, Cuff Size: Normal)   Pulse 84   Temp 98.4 F (36.9 C) (Oral)   Ht 6' 2.49" (1.892 m)   Wt 203 lb 6.4 oz (92.3 kg)   SpO2 96%   BMI 25.77 kg/m   Wt Readings from Last 3 Encounters:  05/22/22 203 lb 6.4 oz (92.3 kg)  04/17/22 197 lb 11.2 oz (89.7 kg)  02/19/22 202 lb (91.6 kg)    Physical Exam Constitutional:      General: He is not in acute distress.    Appearance: Normal appearance. He is normal weight. He is not ill-appearing or toxic-appearing.  HENT:     Head: Normocephalic and atraumatic.  Eyes:     Conjunctiva/sclera: Conjunctivae normal.  Cardiovascular:     Rate and Rhythm: Normal rate and regular rhythm.     Heart sounds: Normal heart sounds. No murmur heard.    No gallop.  Musculoskeletal:        General: Tenderness present. No signs of injury.     Cervical  back: Normal range of motion and neck supple.     Right lower leg: No edema.     Left lower leg: No edema.     Comments: Bilateral sided low back pain  Skin:    General: Skin is warm.  Neurological:     General: No focal deficit present.     Mental Status: He is alert and oriented to person, place, and time. Mental status is at baseline.  Psychiatric:        Mood and Affect: Mood normal.        Behavior: Behavior normal.        Thought Content: Thought content normal.        Judgment: Judgment normal.     Results for orders placed or performed during the hospital encounter of 02/19/22  Urine Culture    Specimen: Urine, Clean Catch  Result Value Ref Range   Specimen Description      URINE, CLEAN CATCH Performed at [facility], [address] [place], [place] [address]    Special Requests      NONE Performed at [facility], [address] [place], [place] [address]    Culture (A)     >=100,000 COLONIES/mL ENTEROCOCCUS FAECIUM VANCOMYCIN RESISTANT ENTEROCOCCUS >=100,000 COLONIES/mL YEAST    Report Status 02/24/2022 FINAL    Organism ID, Bacteria ENTEROCOCCUS FAECIUM (A)       Susceptibility   Enterococcus faecium - MIC*    AMPICILLIN >=32 RESISTANT Resistant     NITROFURANTOIN 128 RESISTANT Resistant     VANCOMYCIN >=32 RESISTANT Resistant     LINEZOLID 2 SENSITIVE Sensitive     * >=100,000 COLONIES/mL ENTEROCOCCUS FAECIUM  Basic metabolic panel  Result Value Ref Range   Sodium 140 135 - 145 mmol/L   Potassium 4.6 3.5 - 5.1 mmol/L   Chloride 114 (H) 98 - 111 mmol/L   CO2 22 22 - 32 mmol/L   Glucose, Bld 123 (H) 70 - 99 mg/dL   BUN 16 6 - 20 mg/dL   Creatinine, Ser 1.39 (H) 0.61 - 1.24 mg/dL   Calcium 10.0 8.9 - 10.3 mg/dL   GFR, Estimated 58 (L) >60 mL/min   Anion gap 4 (L) 5 - 15  CBC  Result Value Ref Range   WBC 15.3 (H) 4.0 - 10.5 K/uL   RBC 3.67 (L) 4.22 - 5.81 MIL/uL   Hemoglobin 10.9 (L) 13.0 - 17.0 g/dL   HCT 34.9 (L) 39.0 - 52.0 %   MCV 95.1 80.0 - 100.0 fL   MCH 29.7 26.0 - 34.0 pg   MCHC 31.2 30.0 - 36.0 g/dL   RDW 14.9 11.5 - 15.5 %   Platelets 352 150 - 400 K/uL   nRBC 0.0 0.0 - 0.2 %  Urinalysis, Routine w reflex microscopic Urine, Unspecified Source  Result Value Ref Range   Color, Urine YELLOW (A) YELLOW   APPearance HAZY (A) CLEAR   Specific Gravity, Urine 1.014 1.005 - 1.030   pH 5.0 5.0 - 8.0   Glucose, UA NEGATIVE NEGATIVE mg/dL   Hgb urine dipstick SMALL (A) NEGATIVE   Bilirubin Urine NEGATIVE NEGATIVE   Ketones, ur NEGATIVE NEGATIVE mg/dL   Protein, ur 30 (A) NEGATIVE mg/dL   Nitrite NEGATIVE NEGATIVE    Leukocytes,Ua SMALL (A) NEGATIVE   RBC / HPF 21-50 0 - 5 RBC/hpf   WBC, UA 21-50 0 - 5 WBC/hpf   Bacteria, UA RARE (A) NONE SEEN   Squamous Epithelial / HPF NONE  SEEN 0 - 5   Mucus PRESENT    Budding Yeast PRESENT   Troponin I (High Sensitivity)  Result Value Ref Range   Troponin I (High Sensitivity) 13 <18 ng/L  Troponin I (High Sensitivity)  Result Value Ref Range   Troponin I (High Sensitivity) 13 <18 ng/L      Assessment & Plan:   Problem List Items Addressed This Visit       Nervous and Auditory   Chronic midline low back pain with bilateral sciatica - Primary    Ongoing for several months and discomfort down into buttocks bilaterally. 04/19/22 xray reveals arthritic changes. Pt reports no relief after Prednisone taper and Tizanidine. Will refer to Ortho and send 5 day supply Norco for pain management. Continue to recommend alternating heat and ice and ensure plenty of rest and gently stretching.  May benefit from MRI in future if ongoing, although ortho may order this.  Will review notes once available.      Relevant Medications   HYDROcodone-acetaminophen (NORCO) 5-325 MG tablet   Other Relevant Orders   Ambulatory referral to Orthopedics     Follow up plan: Return in about 3 months (around 08/20/2022) for HTN/HLD, CKD, PROSTATE CA.

## 2022-05-22 NOTE — Telephone Encounter (Signed)
Patient was made aware

## 2022-05-22 NOTE — Telephone Encounter (Signed)
Spoke with Nira Conn at [facility] about Hydrocodone, per [place] they were told not to fill the Hydrocodone

## 2022-05-22 NOTE — Telephone Encounter (Signed)
On further review of PDMP and discussion by CMA with pharmacy patient is followed by a [place]mond Baseman with pain management, on review this provider is with [facility].  Will have patient alerted they will need to receive pain medication from them and discontinue Norco sent today.

## 2022-05-22 NOTE — Telephone Encounter (Signed)
Copied from [place] [id number]. Topic: General - Other >> May 22, 2022  2:47 PM Sabas Sous wrote: Reason for CRM: Pharmacy called needing clarification/approval for recent Rx, please ask for [facility]. The patient is expecting to get it any second

## 2022-05-22 NOTE — Assessment & Plan Note (Addendum)
Ongoing for several months and discomfort down into buttocks bilaterally. 04/19/22 xray reveals arthritic changes. Pt reports no relief after Prednisone taper and Tizanidine. Will refer to Ortho and send 5 day supply Norco for pain management. Continue to recommend alternating heat and ice and ensure plenty of rest and gently stretching.  May benefit from MRI in future if ongoing, although ortho may order this.  Will review notes once available.

## 2022-05-24 DIAGNOSIS — H5213 Myopia, bilateral: Secondary | ICD-10-CM | POA: Diagnosis not present

## 2022-05-25 DIAGNOSIS — Z6826 Body mass index (BMI) 26.0-26.9, adult: Secondary | ICD-10-CM | POA: Diagnosis not present

## 2022-05-25 DIAGNOSIS — R1031 Right lower quadrant pain: Secondary | ICD-10-CM | POA: Diagnosis not present

## 2022-05-25 DIAGNOSIS — R030 Elevated blood-pressure reading, without diagnosis of hypertension: Secondary | ICD-10-CM | POA: Diagnosis not present

## 2022-05-25 DIAGNOSIS — Z79899 Other long term (current) drug therapy: Secondary | ICD-10-CM | POA: Diagnosis not present

## 2022-05-25 DIAGNOSIS — E7800 Pure hypercholesterolemia, unspecified: Secondary | ICD-10-CM | POA: Diagnosis not present

## 2022-05-25 DIAGNOSIS — R1032 Left lower quadrant pain: Secondary | ICD-10-CM | POA: Diagnosis not present

## 2022-05-25 DIAGNOSIS — I10 Essential (primary) hypertension: Secondary | ICD-10-CM | POA: Diagnosis not present

## 2022-05-31 ENCOUNTER — Ambulatory Visit: Payer: Medicaid Other | Admitting: Neurology

## 2022-06-16 IMAGING — CT CT ABD-PELV W/ CM
2 of 5 series · 15 of 46 positions shown, 17 images · IV contrast (agent unspecified)
Comparison: None.

CLINICAL DATA: Prostate cancer, high risk staging.

* Tracking Code: BO *
EXAM:
CT ABDOMEN AND PELVIS WITH CONTRAST
TECHNIQUE: Multidetector CT imaging of the abdomen and pelvis was performed
using the standard protocol following bolus administration of
intravenous contrast.

[Series 2: abd pelvis 5.00 · axial · 0.73mm/px · z∈[-1572,-1152]mm · 12 of 96 slices shown, 14 images]
[im 6/96  soft-tissue]
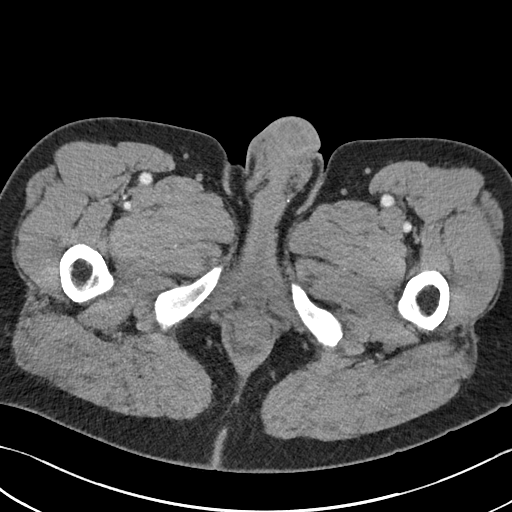
[im 6/96  bone]
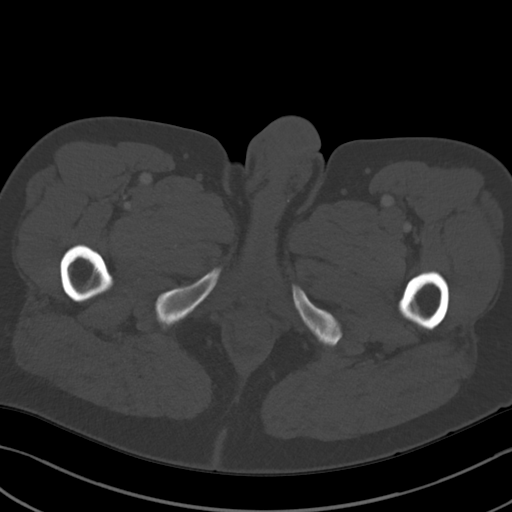
[im 16/96  soft-tissue]
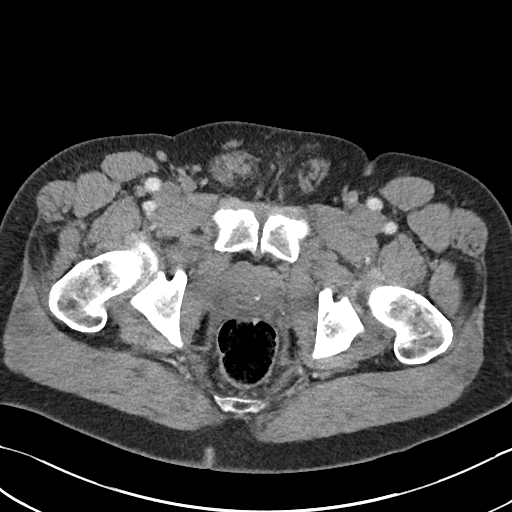
[im 22/96  soft-tissue]
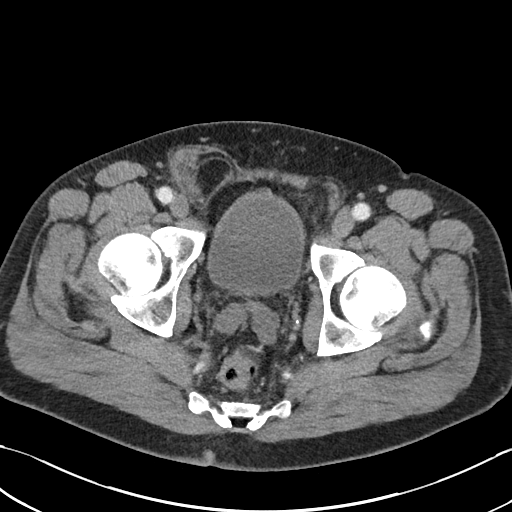
[im 27/96  soft-tissue]
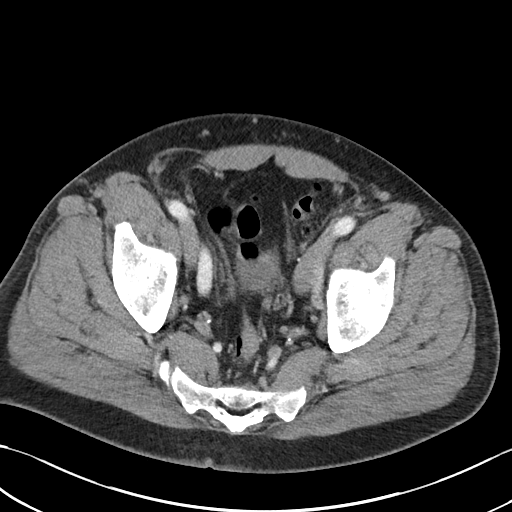
[im 37/96  soft-tissue]
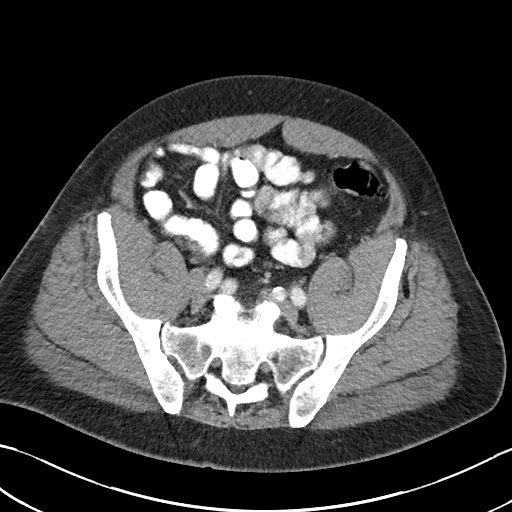
[im 43/96  soft-tissue]
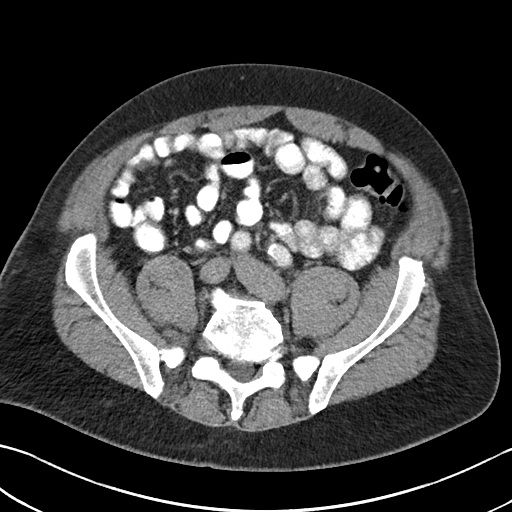
[im 53/96  soft-tissue]
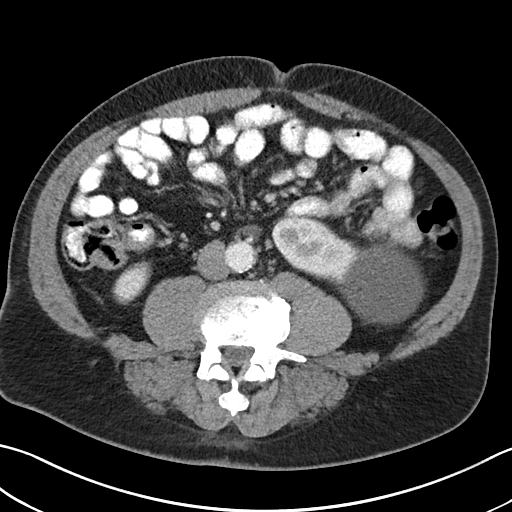
[im 59/96  soft-tissue]
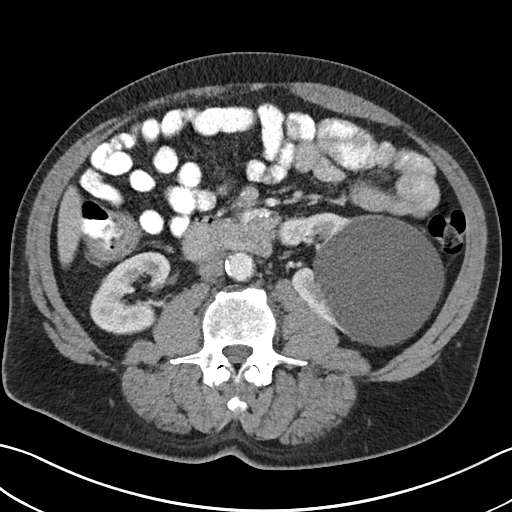
[im 69/96  soft-tissue]
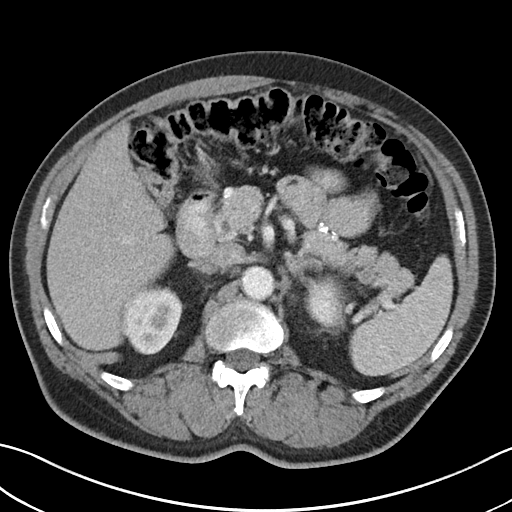
[im 69/96  bone]
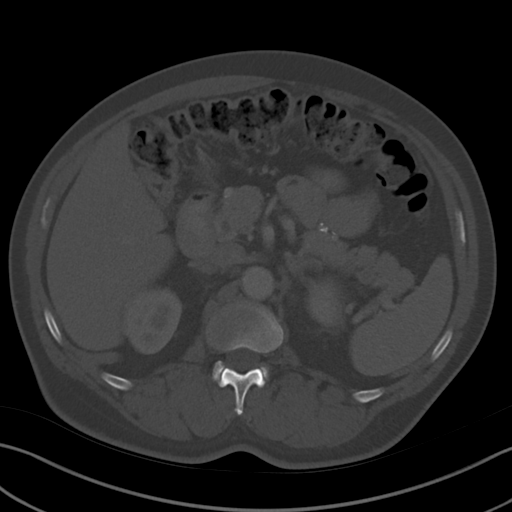
[im 74/96  soft-tissue]
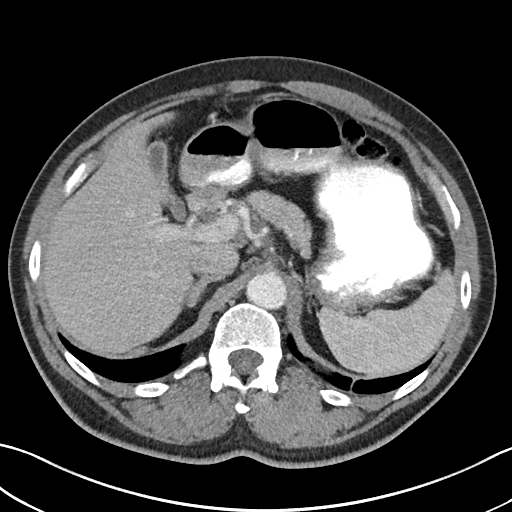
[im 80/96  soft-tissue]
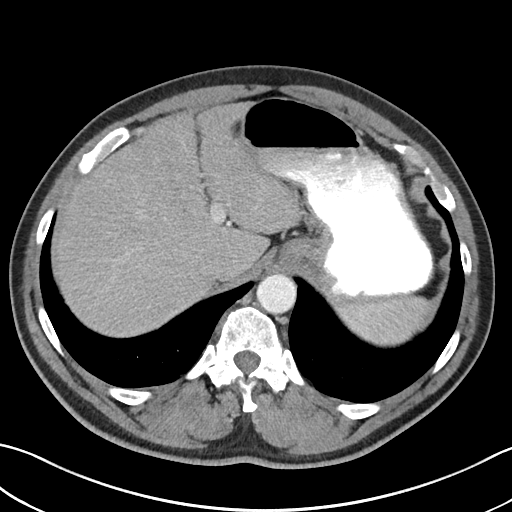
[im 90/96  soft-tissue]
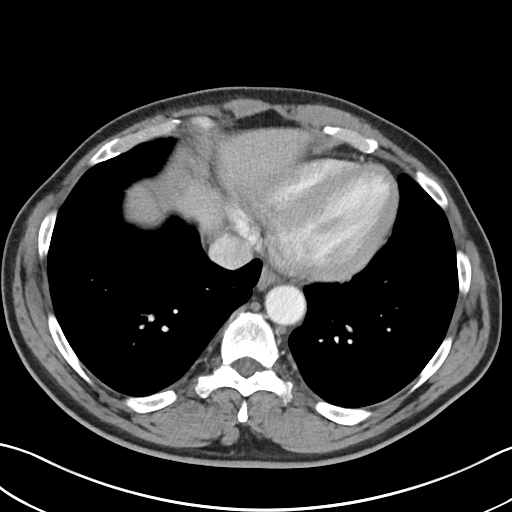

[Series 4: coronals abd pelvis 2.00 cor · coronal · 0.73mm/px · 3 of 158 slices shown]
[im 53/158  soft-tissue]
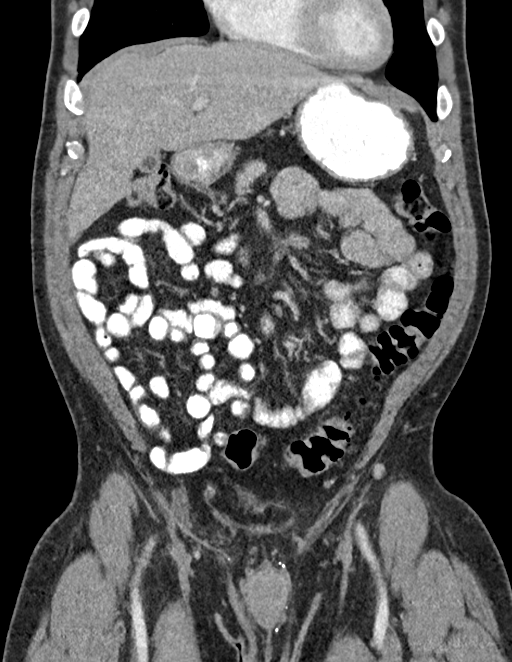
[im 70/158  soft-tissue]
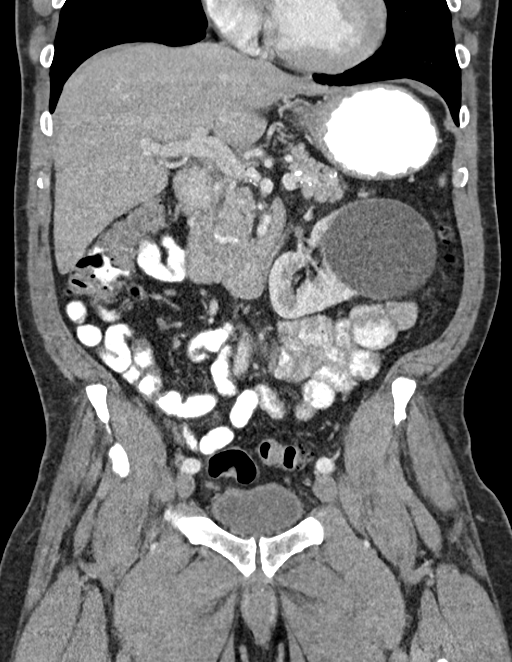
[im 88/158  soft-tissue]
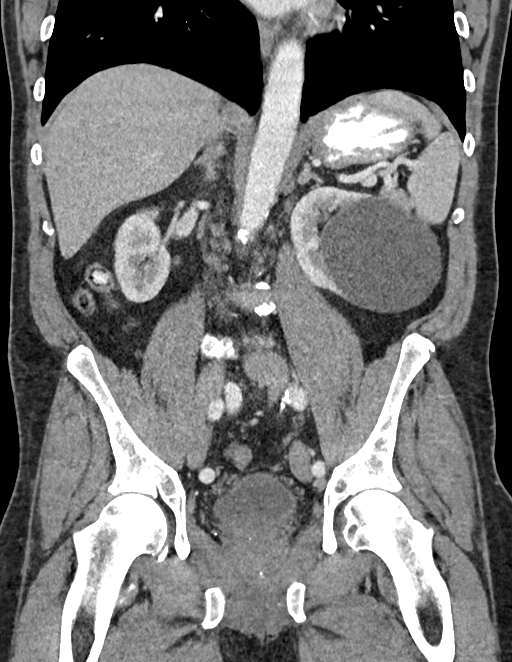

[15 of 46 positions shown; findings below may reference images not displayed]

RADIATION DOSE REDUCTION: This exam was performed according to the
departmental dose-optimization program which includes automated
exposure control, adjustment of the mA and/or kV according to
patient size and/or use of iterative reconstruction technique.

CONTRAST:  80mL OMNIPAQUE IOHEXOL 300 MG/ML  SOLN
FINDINGS: Lower chest: No acute abnormality.

Hepatobiliary: No suspicious hepatic lesion. Gallbladder is
nondistended. No biliary ductal dilation.

Pancreas: No pancreatic ductal dilation or evidence of acute
inflammation.

Spleen: No splenomegaly or focal splenic lesion.

Adrenals/Urinary Tract: Mild thickening of the bilateral adrenal
glands without discrete nodularity. No hydronephrosis. Punctate
nonobstructive right lower pole renal stone. Partially exophytic
fluid density 9 cm left renal cyst which is benign and requires no
independent follow-up. Mild wall thickening of an incompletely
distended urinary bladder.

Stomach/Bowel: Radiopaque enteric contrast material traverses the
ileocecal valve. Stomach is moderately distended with gas and
ingested material without focal wall thickening. No pathologic
dilation of small or large bowel. The appendix and terminal ileum
appear normal. Sigmoid colonic diverticulosis without findings of
acute diverticulitis.

Vascular/Lymphatic: Aortic and branch vessel atherosclerosis without
abdominal aortic aneurysm. No pathologically enlarged abdominal or
pelvic lymph nodes.

Reproductive: Prostate is unremarkable in CT appearance.

Other: Small fat containing right inguinal hernia.

Musculoskeletal: There are few sclerotic foci in the pelvic girdle
for instance in the left iliac bone on image 58/2 and right hemi
sacrum on image 58/2. Tiny lucent focus in the left iliac bone along
the SI joint measuring 5 mm on image 66/2 demonstrates a narrow zone
of transition is favored benign. Advanced lower lumbar predominant
multilevel degenerative changes spine. Degenerative changes
bilateral hips and SI joints.
IMPRESSION: 1. No pathologically enlarged abdominal or pelvic lymph nodes.
2. There are few sclerotic foci in the pelvic girdle which are
nonspecific and favored to reflect bone islands. However, in the
setting of high risk prostate cancer would suggest correlation with
nuclear medicine bone scan.
3. Sigmoid colonic diverticulosis without findings of acute
diverticulitis.
4. Punctate nonobstructive right lower pole renal stone.
5. Small fat containing right inguinal hernia.
6.  Aortic Atherosclerosis (B1MMV-S62.2).

## 2022-06-19 ENCOUNTER — Telehealth: Payer: Self-pay | Admitting: Nurse Practitioner

## 2022-06-19 NOTE — Telephone Encounter (Signed)
Patients friend [name] came in to the office and states the Patient needs RX  for  a [employer] from [employer] for his wheel chair.  She was told that it needs to be worded that it bothers him to sit in the wheel chair and that he can not get comfortable.

## 2022-06-19 NOTE — Telephone Encounter (Signed)
Patient was given appt on Friday 3/8 at 8:40am

## 2022-06-19 NOTE — Telephone Encounter (Signed)
Caller would like PCP to send orders for a roho cushion for his wheelchair. Caller states the cushion would also be used for when ambulating in the car or when he is not in the wheelchair. Please send orders to   [employer] [address], [place], [place] [address] [phone]

## 2022-06-20 DIAGNOSIS — M5416 Radiculopathy, lumbar region: Secondary | ICD-10-CM | POA: Diagnosis not present

## 2022-06-20 DIAGNOSIS — M545 Low back pain, unspecified: Secondary | ICD-10-CM | POA: Diagnosis not present

## 2022-06-20 DIAGNOSIS — M4600 Spinal enthesopathy, site unspecified: Secondary | ICD-10-CM | POA: Diagnosis not present

## 2022-06-20 DIAGNOSIS — M25552 Pain in left hip: Secondary | ICD-10-CM | POA: Diagnosis not present

## 2022-06-20 DIAGNOSIS — M25551 Pain in right hip: Secondary | ICD-10-CM | POA: Diagnosis not present

## 2022-06-21 DIAGNOSIS — R1032 Left lower quadrant pain: Secondary | ICD-10-CM | POA: Diagnosis not present

## 2022-06-21 DIAGNOSIS — R1031 Right lower quadrant pain: Secondary | ICD-10-CM | POA: Diagnosis not present

## 2022-06-21 DIAGNOSIS — I10 Essential (primary) hypertension: Secondary | ICD-10-CM | POA: Diagnosis not present

## 2022-06-21 DIAGNOSIS — Z6825 Body mass index (BMI) 25.0-25.9, adult: Secondary | ICD-10-CM | POA: Diagnosis not present

## 2022-06-21 DIAGNOSIS — Z79899 Other long term (current) drug therapy: Secondary | ICD-10-CM | POA: Diagnosis not present

## 2022-06-21 DIAGNOSIS — R030 Elevated blood-pressure reading, without diagnosis of hypertension: Secondary | ICD-10-CM | POA: Diagnosis not present

## 2022-06-22 ENCOUNTER — Ambulatory Visit: Payer: Medicaid Other | Admitting: Nurse Practitioner

## 2022-06-22 ENCOUNTER — Encounter: Payer: Self-pay | Admitting: Nurse Practitioner

## 2022-06-22 VITALS — BP 142/88 | HR 102 | Temp 98.1°F | Ht 74.49 in | Wt 199.5 lb

## 2022-06-22 DIAGNOSIS — G8929 Other chronic pain: Secondary | ICD-10-CM | POA: Diagnosis not present

## 2022-06-22 DIAGNOSIS — M5441 Lumbago with sciatica, right side: Secondary | ICD-10-CM

## 2022-06-22 DIAGNOSIS — I10 Essential (primary) hypertension: Secondary | ICD-10-CM | POA: Diagnosis not present

## 2022-06-22 DIAGNOSIS — C61 Malignant neoplasm of prostate: Secondary | ICD-10-CM | POA: Diagnosis not present

## 2022-06-22 DIAGNOSIS — Z7409 Other reduced mobility: Secondary | ICD-10-CM | POA: Diagnosis not present

## 2022-06-22 DIAGNOSIS — M5442 Lumbago with sciatica, left side: Secondary | ICD-10-CM | POA: Diagnosis not present

## 2022-06-22 NOTE — Patient Instructions (Signed)

## 2022-06-22 NOTE — Progress Notes (Signed)
BP (!) 142/88 (BP Location: Left Arm, Patient Position: Sitting, Cuff Size: Normal) Comment: has not taken medication this morning  Pulse (!) 102   Temp 98.1 F (36.7 C) (Oral)   Ht 6' 2.49" (1.892 m)   Wt 199 lb 8 oz (90.5 kg)   SpO2 99%   BMI 25.28 kg/m    Subjective:    Patient ID: Todd Craig, male    DOB: [birth year], 62 y.o.   MRN: [id number]  HPI: Todd Craig is a 62 y.o. male  Chief Complaint  Patient presents with   Roho Cushion    Patient would like a prescription for a Roho Cushion.   HYPERTENSION  Taking Losartan 100 MG daily and Amlodipine 10 MG daily = did not take medication this morning.  Crestor for HLD. Hypertension status: stable  Satisfied with current treatment? yes Duration of hypertension: chronic BP monitoring frequency:  not checking BP range:  BP medication side effects:  no Medication compliance: good compliance Aspirin: no Recurrent headaches: no Visual changes: no Palpitations: no Dyspnea: no Chest pain: no Lower extremity edema: no Dizzy/lightheaded: no   POOR MOBILITY: History of chronic pain post prostate cancer and surgery + osteoarthritis hips.  Followed by [facility] in [place] for pain clinic and [facility]. [facility] requested MRI and are sending to rheumatology.  Has been unable to ambulate well since 2023 due to chronic bilateral hip pain and lower back.  Patient can transfer independently.  Currently uses manual wheelchair and would like a Roho cushion for this which has been recommended by ortho, as he enjoys being active to his highest level and this would reduce pressure risks while in chair and increase comfort.  Is dependent on manual wheelchair to assist with ADLs, especially longer distances to locations.  Currently uses manual wheelchair often, daily basis due to chronic pain.  Is about to start physical therapy with [facility].  He has tried cushions in the store and pillows to increase comfort while in  chair without benefit.  Send to [employer].  Relevant past medical, surgical, family and social history reviewed and updated as indicated. Interim medical history since our last visit reviewed. Allergies and medications reviewed and updated.  Review of Systems  Constitutional:  Negative for appetite change and fever.  HENT:  Negative for congestion.   Respiratory:  Negative for chest tightness, shortness of breath and wheezing.   Cardiovascular:  Negative for chest pain, palpitations and leg swelling.  Genitourinary:  Negative for difficulty urinating.  Musculoskeletal:  Positive for back pain.  Neurological:  Negative for headaches.  Psychiatric/Behavioral:  Negative for agitation, behavioral problems, confusion, self-injury, sleep disturbance and suicidal ideas. The patient is not nervous/anxious.     Per HPI unless specifically indicated above     Objective:    BP (!) 142/88 (BP Location: Left Arm, Patient Position: Sitting, Cuff Size: Normal) Comment: has not taken medication this morning  Pulse (!) 102   Temp 98.1 F (36.7 C) (Oral)   Ht 6' 2.49" (1.892 m)   Wt 199 lb 8 oz (90.5 kg)   SpO2 99%   BMI 25.28 kg/m   Wt Readings from Last 3 Encounters:  06/22/22 199 lb 8 oz (90.5 kg)  05/22/22 203 lb 6.4 oz (92.3 kg)  04/17/22 197 lb 11.2 oz (89.7 kg)    Physical Exam Vitals and nursing note reviewed.  Constitutional:      General: He is awake. He is not in acute  distress.    Appearance: He is well-developed and well-groomed. He is not ill-appearing or toxic-appearing.  HENT:     Head: Normocephalic.  Eyes:     General: Lids are normal.     Extraocular Movements: Extraocular movements intact.     Conjunctiva/sclera: Conjunctivae normal.  Neck:     Thyroid: No thyromegaly.     Vascular: No carotid bruit.  Cardiovascular:     Rate and Rhythm: Normal rate and regular rhythm.     Heart sounds: Normal heart sounds.  Pulmonary:     Effort: No accessory muscle  usage or respiratory distress.     Breath sounds: Normal breath sounds.  Abdominal:     General: Bowel sounds are normal. There is no distension.     Palpations: Abdomen is soft.     Tenderness: There is no abdominal tenderness.  Musculoskeletal:     Cervical back: Full passive range of motion without pain.     Lumbar back: Tenderness present. No edema or spasms. Decreased range of motion (flexion and extension mild decrease). Negative right straight leg raise test and negative left straight leg raise test. No scoliosis.     Right lower leg: No edema.     Left lower leg: No edema.  Lymphadenopathy:     Cervical: No cervical adenopathy.  Skin:    General: Skin is warm.     Capillary Refill: Capillary refill takes less than 2 seconds.  Neurological:     Mental Status: He is alert and oriented to person, place, and time.     Deep Tendon Reflexes: Reflexes are normal and symmetric.     Reflex Scores:      Brachioradialis reflexes are 2+ on the right side and 2+ on the left side.      Patellar reflexes are 2+ on the right side and 2+ on the left side. Psychiatric:        Attention and Perception: Attention normal.        Mood and Affect: Mood normal.        Speech: Speech normal.        Behavior: Behavior normal. Behavior is cooperative.        Thought Content: Thought content normal.     Results for orders placed or performed during the hospital encounter of 02/19/22  Urine Culture   Specimen: Urine, Clean Catch  Result Value Ref Range   Specimen Description      URINE, CLEAN CATCH Performed at [facility], [address] [place], [place] [address]    Special Requests      NONE Performed at [facility], [place], [place] [address]    Culture (A)     >=100,000 COLONIES/mL ENTEROCOCCUS FAECIUM VANCOMYCIN RESISTANT ENTEROCOCCUS >=100,000 COLONIES/mL YEAST    Report Status 02/24/2022 FINAL    Organism ID, Bacteria ENTEROCOCCUS FAECIUM (A)        Susceptibility   Enterococcus faecium - MIC*    AMPICILLIN >=32 RESISTANT Resistant     NITROFURANTOIN 128 RESISTANT Resistant     VANCOMYCIN >=32 RESISTANT Resistant     LINEZOLID 2 SENSITIVE Sensitive     * >=100,000 COLONIES/mL ENTEROCOCCUS FAECIUM  Basic metabolic panel  Result Value Ref Range   Sodium 140 135 - 145 mmol/L   Potassium 4.6 3.5 - 5.1 mmol/L   Chloride 114 (H) 98 - 111 mmol/L   CO2 22 22 - 32 mmol/L   Glucose, Bld 123 (H) 70 - 99 mg/dL  BUN 16 6 - 20 mg/dL   Creatinine, Ser 1.39 (H) 0.61 - 1.24 mg/dL   Calcium 10.0 8.9 - 10.3 mg/dL   GFR, Estimated 58 (L) >60 mL/min   Anion gap 4 (L) 5 - 15  CBC  Result Value Ref Range   WBC 15.3 (H) 4.0 - 10.5 K/uL   RBC 3.67 (L) 4.22 - 5.81 MIL/uL   Hemoglobin 10.9 (L) 13.0 - 17.0 g/dL   HCT 34.9 (L) 39.0 - 52.0 %   MCV 95.1 80.0 - 100.0 fL   MCH 29.7 26.0 - 34.0 pg   MCHC 31.2 30.0 - 36.0 g/dL   RDW 14.9 11.5 - 15.5 %   Platelets 352 150 - 400 K/uL   nRBC 0.0 0.0 - 0.2 %  Urinalysis, Routine w reflex microscopic Urine, Unspecified Source  Result Value Ref Range   Color, Urine YELLOW (A) YELLOW   APPearance HAZY (A) CLEAR   Specific Gravity, Urine 1.014 1.005 - 1.030   pH 5.0 5.0 - 8.0   Glucose, UA NEGATIVE NEGATIVE mg/dL   Hgb urine dipstick SMALL (A) NEGATIVE   Bilirubin Urine NEGATIVE NEGATIVE   Ketones, ur NEGATIVE NEGATIVE mg/dL   Protein, ur 30 (A) NEGATIVE mg/dL   Nitrite NEGATIVE NEGATIVE   Leukocytes,Ua SMALL (A) NEGATIVE   RBC / HPF 21-50 0 - 5 RBC/hpf   WBC, UA 21-50 0 - 5 WBC/hpf   Bacteria, UA RARE (A) NONE SEEN   Squamous Epithelial / HPF NONE SEEN 0 - 5   Mucus PRESENT    Budding Yeast PRESENT   Troponin I (High Sensitivity)  Result Value Ref Range   Troponin I (High Sensitivity) 13 <18 ng/L  Troponin I (High Sensitivity)  Result Value Ref Range   Troponin I (High Sensitivity) 13 <18 ng/L      Assessment & Plan:   Problem List Items Addressed This Visit       Cardiovascular and  Mediastinum   Primary hypertension - Primary    Chronic, ongoing with elevation initial check and trending down -- has not taken medication this morning -- highly recommend they recheck at home 2 hours after medication taken.  Continue current medication regimen and adjust as needed -- could consider BB or HCTZ if ongoing highs.  Recommend he monitor BP at least a few mornings a week at home and document. DASH diet at home.  Labs up to date.  Plan for return in 3 months for recheck and labs.         Nervous and Auditory   Chronic midline low back pain with bilateral sciatica    Ongoing for several months -- discomfort down into buttocks bilaterally, ?sciatic -- history of degenerative disc on past imaging 2013.  At this time continue collaboration with ortho and pain clinic.  Recommend alternate heat and ice + ensure plenty of rest and gentle stretching.  May take Tylenol as needed.  Is about to start PT with [facility], will write script today for Roho cushion for manual wheelchair, however advised them if not covered they may need to obtain script from ortho or PT.      Relevant Orders   For home use only DME Other see comment     Other   Poor mobility    Due to recent prostatectomy and foley catheter -- 11/28/21.  Continue use of manual wheelchair.   Is about to start PT with [facility], will write script today for Roho cushion for manual wheelchair,  however advised them if not covered they may need to obtain script from ortho or PT.      Relevant Orders   For home use only DME Other see comment     Follow up plan: Return in about 3 months (around 09/22/2022) for HTN/HLD, PROSTATE CA.

## 2022-06-22 NOTE — Assessment & Plan Note (Signed)
Due to recent prostatectomy and foley catheter -- 11/28/21.  Continue use of manual wheelchair.   Is about to start PT with [facility], will write script today for Roho cushion for manual wheelchair, however advised them if not covered they may need to obtain script from ortho or PT.

## 2022-06-22 NOTE — Assessment & Plan Note (Signed)
Ongoing for several months -- discomfort down into buttocks bilaterally, ?sciatic -- history of degenerative disc on past imaging 2013.  At this time continue collaboration with ortho and pain clinic.  Recommend alternate heat and ice + ensure plenty of rest and gentle stretching.  May take Tylenol as needed.  Is about to start PT with [facility], will write script today for Roho cushion for manual wheelchair, however advised them if not covered they may need to obtain script from ortho or PT.

## 2022-06-22 NOTE — Assessment & Plan Note (Signed)
Chronic, ongoing with elevation initial check and trending down -- has not taken medication this morning -- highly recommend they recheck at home 2 hours after medication taken.  Continue current medication regimen and adjust as needed -- could consider BB or HCTZ if ongoing highs.  Recommend he monitor BP at least a few mornings a week at home and document. DASH diet at home.  Labs up to date.  Plan for return in 3 months for recheck and labs.

## 2022-06-27 DIAGNOSIS — M48061 Spinal stenosis, lumbar region without neurogenic claudication: Secondary | ICD-10-CM | POA: Diagnosis not present

## 2022-06-27 DIAGNOSIS — M6284 Sarcopenia: Secondary | ICD-10-CM | POA: Diagnosis not present

## 2022-06-27 DIAGNOSIS — M5416 Radiculopathy, lumbar region: Secondary | ICD-10-CM | POA: Diagnosis not present

## 2022-07-04 DIAGNOSIS — M5416 Radiculopathy, lumbar region: Secondary | ICD-10-CM | POA: Diagnosis not present

## 2022-07-06 DIAGNOSIS — R030 Elevated blood-pressure reading, without diagnosis of hypertension: Secondary | ICD-10-CM | POA: Diagnosis not present

## 2022-07-06 DIAGNOSIS — R1031 Right lower quadrant pain: Secondary | ICD-10-CM | POA: Diagnosis not present

## 2022-07-06 DIAGNOSIS — J069 Acute upper respiratory infection, unspecified: Secondary | ICD-10-CM | POA: Diagnosis not present

## 2022-07-06 DIAGNOSIS — I10 Essential (primary) hypertension: Secondary | ICD-10-CM | POA: Diagnosis not present

## 2022-07-06 DIAGNOSIS — Z20822 Contact with and (suspected) exposure to covid-19: Secondary | ICD-10-CM | POA: Diagnosis not present

## 2022-07-06 DIAGNOSIS — Z79899 Other long term (current) drug therapy: Secondary | ICD-10-CM | POA: Diagnosis not present

## 2022-07-06 DIAGNOSIS — R1032 Left lower quadrant pain: Secondary | ICD-10-CM | POA: Diagnosis not present

## 2022-07-06 DIAGNOSIS — Z6825 Body mass index (BMI) 25.0-25.9, adult: Secondary | ICD-10-CM | POA: Diagnosis not present

## 2022-07-10 ENCOUNTER — Encounter: Payer: Self-pay | Admitting: Neurology

## 2022-07-10 ENCOUNTER — Ambulatory Visit: Payer: Medicaid Other | Admitting: Neurology

## 2022-07-10 VITALS — BP 157/102 | HR 106 | Ht 74.0 in | Wt 196.5 lb

## 2022-07-10 DIAGNOSIS — R4182 Altered mental status, unspecified: Secondary | ICD-10-CM

## 2022-07-10 DIAGNOSIS — I6381 Other cerebral infarction due to occlusion or stenosis of small artery: Secondary | ICD-10-CM

## 2022-07-10 DIAGNOSIS — Z79899 Other long term (current) drug therapy: Secondary | ICD-10-CM | POA: Diagnosis not present

## 2022-07-10 DIAGNOSIS — M545 Low back pain, unspecified: Secondary | ICD-10-CM | POA: Diagnosis not present

## 2022-07-10 MED ORDER — PREGABALIN 300 MG PO CAPS: 300.0000 mg | ORAL_CAPSULE | Freq: Two times a day (BID) | ORAL | 0 refills | Status: DC

## 2022-07-10 MED ORDER — KETOROLAC TROMETHAMINE 60 MG/2ML IM SOLN
60.0000 mg | Freq: Once | INTRAMUSCULAR | Status: AC
  Administered 2022-07-10: 60 mg via INTRAMUSCULAR

## 2022-07-10 NOTE — Patient Instructions (Signed)
Toradol shot in office Pregabalin 300 mg twice daily Continue your other medications Routine EEG Continue to follow-up with your doctors Return in 3 months or sooner if worse

## 2022-07-10 NOTE — Progress Notes (Signed)
[facility]  PATIENT: Todd Craig DOB: 06/16/1960  REQUESTING CLINICIAN: Venita Lick, NP HISTORY FROM: Patient and fiancee  REASON FOR VISIT: Abnormal MRI    HISTORICAL  CHIEF COMPLAINT:  Chief Complaint  Patient presents with   Follow-up    Rm 95, wife present Cerebrovascular accident (CVA) due to occlusion of small artery: fainting spells, lightheadedness ongoing since prostate surgery    INTERVAL HISTORY 07/10/2022:  Patient presents today for follow-up, he is accompanied by wife.  Last visit was in July of last year.  Wife reports in August last year patient was diagnosed with prostate cancer, had procedure done, but his course was complicated by UTI, and back pain.  Currently he is experiencing severe back pain that radiate down to the right leg.  He did follow-up with orthopedist, had MRI done showing:  1. Severe left mixed L3-L4 neural foraminal narrowing compressing the exiting left L3 nerve root related to endplate ridging. 2. Mixed Modic type 1-2 endplate changes at X33443 and L4-L5. 3. Paraspinal sarcopenia.  He is pending ESI injection.   Wife reports that he still experience episode of severe diaphoresis, collapse and confusion. He was not able to complete the ambulatory EEG.    HISTORY OF PRESENT ILLNESS:  This is a 62 year old gentleman past medical history of hypertension, hyperlipidemia, stroke in 2012 when he presented for right arm weakness who is presenting with abnormal MRI.  Patient reports experiencing hearing loss in his right ear, he did follow-up with audiology and ENT and had an MRI which showed an acute right frontal stroke but multiple lacunar strokes.  Due to those abnormalities, he was referred to neurology.  At the moment he does not report any focal neurological deficit, denies any abnormalities and has no complaints.  Ellene Route has reported 2 episodes of syncope, the last one being on June 1 while he was in the barbershop.  After  each episode patient is confused and profusely sweating.  There are no report of abnormal movement   OTHER MEDICAL CONDITIONS: Hearing loss, hypertension    REVIEW OF SYSTEMS: Full 14 system review of systems performed and negative with exception of: as noted in the HPI   ALLERGIES: Allergies  Allergen Reactions   Tramadol     Other reaction(s): Other    HOME MEDICATIONS: Outpatient Medications Prior to Visit  Medication Sig Dispense Refill   amLODipine (NORVASC) 10 MG tablet Take 1 tablet (10 mg total) by mouth daily. 90 tablet 4   colchicine 0.6 MG tablet Take 1 tablet (0.6 mg total) by mouth every 2 (two) hours as needed (gout pain). 4 tablet 12   losartan (COZAAR) 100 MG tablet Take 1 tablet (100 mg total) by mouth daily. 90 tablet 4   rosuvastatin (CRESTOR) 20 MG tablet Take 1 tablet (20 mg total) by mouth daily. 90 tablet 4   No facility-administered medications prior to visit.    PAST MEDICAL HISTORY: Past Medical History:  Diagnosis Date   GERD (gastroesophageal reflux disease)    Hypertension    Hypertensive crisis, unspecified 01/11/2020   Stroke ([place]) 2014    PAST SURGICAL HISTORY: Past Surgical History:  Procedure Laterality Date   PROSTATE BIOPSY N/A 08/01/2021   Procedure: PROSTATE BIOPSY;  Surgeon: Abbie Sons, MD;  Location: [facility] ORS;  Service: Urology;  Laterality: N/A;   right hand repair  1985   trauma with saw laceration    TRANSRECTAL ULTRASOUND N/A 08/01/2021   Procedure: TRANSRECTAL ULTRASOUND;  Surgeon: Bernardo Heater,  Ronda Fairly, MD;  Location: [facility] ORS;  Service: Urology;  Laterality: N/A;    FAMILY HISTORY: No family history on file.  SOCIAL HISTORY: Social History   Socioeconomic History   Marital status: Single    Spouse name: Not on file   Number of children: Not on file   Years of education: Not on file   Highest education level: Not on file  Occupational History   Not on file  Tobacco Use   Smoking status: Every Day    Packs/day:  .5    Types: Cigarettes   Smokeless tobacco: Never  Vaping Use   Vaping Use: Never used  Substance and Sexual Activity   Alcohol use: Yes    Alcohol/week: 5.0 standard drinks of alcohol    Types: 5 Cans of beer per week    Comment: on weekends   Drug use: No   Sexual activity: Yes  Other Topics Concern   Not on file  Social History Narrative   Not on file   Social Determinants of Health   Financial Resource Strain: Low Risk  (03/02/2021)   Overall Financial Resource Strain (CARDIA)    Difficulty of Paying Living Expenses: Not very hard  Food Insecurity: No Food Insecurity (03/02/2021)   Hunger Vital Sign    Worried About Running Out of Food in the Last Year: Never true    Ran Out of Food in the Last Year: Never true  Transportation Needs: No Transportation Needs (03/02/2021)   PRAPARE - Hydrologist (Medical): No    Lack of Transportation (Non-Medical): No  Physical Activity: Sufficiently Active (03/02/2021)   Exercise Vital Sign    Days of Exercise per Week: 4 days    Minutes of Exercise per Session: 40 min  Stress: No Stress Concern Present (03/02/2021)   [place]    Feeling of Stress : Only a little  Social Connections: Moderately Integrated (03/02/2021)   Social Connection and Isolation Panel [NHANES]    Frequency of Communication with Friends and Family: More than three times a week    Frequency of Social Gatherings with Friends and Family: More than three times a week    Attends Religious Services: More than 4 times per year    Active Member of [employer] or Organizations: No    Attends Archivist Meetings: Never    Marital Status: Living with partner  Intimate Partner Violence: Not At Risk (03/02/2021)   Humiliation, Afraid, Rape, and Kick questionnaire    Fear of Current or Ex-Partner: No    Emotionally Abused: No    Physically Abused: No    Sexually Abused:  No     PHYSICAL EXAM  GENERAL EXAM/CONSTITUTIONAL: Vitals:  Vitals:   07/10/22 1136  BP: (!) 157/102  Pulse: (!) 106  Weight: 196 lb 8 oz (89.1 kg)  Height: 6\' 2"  (1.88 m)   Body mass index is 25.23 kg/m. Wt Readings from Last 3 Encounters:  07/10/22 196 lb 8 oz (89.1 kg)  06/22/22 199 lb 8 oz (90.5 kg)  05/22/22 203 lb 6.4 oz (92.3 kg)   Patient is in no distress; well developed, nourished and groomed; neck is supple. In severe pain, restless, unable to seat down.   EYES:  Visual fields full to confrontation, Extraocular movements intacts,   MUSCULOSKELETAL: Gait, strength, tone, movements noted in Neurologic exam below  NEUROLOGIC: MENTAL STATUS:      No data to display  awake, alert, oriented to person, place and time recent and remote memory intact normal attention and concentration language fluent, comprehension intact, naming intact fund of knowledge appropriate  CRANIAL NERVE:  2nd, 3rd, 4th, 6th - visual fields full to confrontation, extraocular muscles intact, no nystagmus 5th - facial sensation symmetric 7th - facial strength symmetric 8th - hearing intact 9th - palate elevates symmetrically, uvula midline 11th - shoulder shrug symmetric 12th - tongue protrusion midline  MOTOR:  normal bulk and tone, full strength in the BUE, BLE  SENSORY:  normal and symmetric to light touch  GAIT/STATION:  Antalgic     DIAGNOSTIC DATA (LABS, IMAGING, TESTING) - I reviewed patient records, labs, notes, testing and imaging myself where available.  Lab Results  Component Value Date   WBC 15.3 (H) 02/19/2022   HGB 10.9 (L) 02/19/2022   HCT 34.9 (L) 02/19/2022   MCV 95.1 02/19/2022   PLT 352 02/19/2022      Component Value Date/Time   NA 140 02/19/2022 1155   NA 136 01/18/2022 1425   K 4.6 02/19/2022 1155   K 4.0 05/24/2011 0640   CL 114 (H) 02/19/2022 1155   CO2 22 02/19/2022 1155   GLUCOSE 123 (H) 02/19/2022 1155   BUN 16 02/19/2022  1155   BUN 9 01/18/2022 1425   CREATININE 1.39 (H) 02/19/2022 1155   CALCIUM 10.0 02/19/2022 1155   PROT 7.1 01/18/2022 1425   ALBUMIN 4.5 01/18/2022 1425   AST 17 01/18/2022 1425   ALT 13 01/18/2022 1425   ALKPHOS 54 01/18/2022 1425   BILITOT 0.3 01/18/2022 1425   GFRNONAA 58 (L) 02/19/2022 1155   GFRAA >60 01/11/2020 1300   Lab Results  Component Value Date   CHOL 199 03/02/2021   HDL 43 03/02/2021   LDLCALC 126 (H) 03/02/2021   TRIG 170 (H) 03/02/2021   CHOLHDL 4 02/16/2020   Lab Results  Component Value Date   HGBA1C 4.3 (L) 03/02/2021   No results found for: "VITAMINB12" Lab Results  Component Value Date   TSH 0.911 03/02/2021     MRI Brain 09/22/21 1. 4 mm acute subcortical infarct in the right frontal lobe. 2. Negative internal auditory canal imaging. No retrocochlear lesion. 3. Severe chronic small vessel ischemic disease, progressed from 2012. 4. 5 mm extra-axial mass along the falx consistent with a meningioma. No mass effect or brain edema. 5. Innumerable chronic microhemorrhages compatible with chronic hypertension.   MRI Lumbar spine  1. Severe left mixed L3-L4 neural foraminal narrowing compressing the exiting left L3 nerve root related to endplate ridging.  2. Mixed Modic type 1-2 endplate changes at X33443 and L4-L5.  3. Paraspinal sarcopenia.    ASSESSMENT AND PLAN  61 y.o. year old male with vascular risk factors including hypertension, hyperlipidemia, prostate cancer, who is presenting for follow-up for his previous stroke and episode concerning for seizures.  In terms of the previous stroke, wife reports that he is stable, he is compliant with his medications. For his episode of confusion and collapse, he was unable to complete the ambulatory EEG, will request a routine EEG.  Patient today was very restless, in severe pain due to his back issues.  He is pending ESI injection.  I will give him a Toradol shot in the office and also prescribe him  pregabalin 300 mg twice daily.  I will give him a 30-day supply. They understand to contact me if he needs more medication.  Will obtain a routine EEG and I will  see the patient in 3 months for follow-up.    1. Cerebrovascular accident (CVA) due to occlusion of small artery ([place])   2. Acute midline low back pain, unspecified whether sciatica present   3. Altered mental status, unspecified altered mental status type      Patient Instructions  Toradol shot in office Pregabalin 300 mg twice daily Continue your other medications Routine EEG Continue to follow-up with your doctors Return in 3 months or sooner if worse  Orders Placed This Encounter  Procedures   EEG adult    Meds ordered this encounter  Medications   pregabalin (LYRICA) 300 MG capsule    Sig: Take 1 capsule (300 mg total) by mouth 2 (two) times daily.    Dispense:  60 capsule    Refill:  0   ketorolac (TORADOL) injection 60 mg    Return in about 3 months (around 10/10/2022).    Alric Ran, MD 07/10/2022, 12:39 PM  [facility] [address], [place], [place] [address] [phone]

## 2022-07-11 ENCOUNTER — Ambulatory Visit: Payer: Medicaid Other | Admitting: Nurse Practitioner

## 2022-07-20 DIAGNOSIS — I10 Essential (primary) hypertension: Secondary | ICD-10-CM | POA: Diagnosis not present

## 2022-07-20 DIAGNOSIS — Z79899 Other long term (current) drug therapy: Secondary | ICD-10-CM | POA: Diagnosis not present

## 2022-07-20 DIAGNOSIS — R030 Elevated blood-pressure reading, without diagnosis of hypertension: Secondary | ICD-10-CM | POA: Diagnosis not present

## 2022-07-20 DIAGNOSIS — R1031 Right lower quadrant pain: Secondary | ICD-10-CM | POA: Diagnosis not present

## 2022-07-20 DIAGNOSIS — R1032 Left lower quadrant pain: Secondary | ICD-10-CM | POA: Diagnosis not present

## 2022-07-20 DIAGNOSIS — Z6826 Body mass index (BMI) 26.0-26.9, adult: Secondary | ICD-10-CM | POA: Diagnosis not present

## 2022-07-24 DIAGNOSIS — Z79899 Other long term (current) drug therapy: Secondary | ICD-10-CM | POA: Diagnosis not present

## 2022-07-25 DIAGNOSIS — M533 Sacrococcygeal disorders, not elsewhere classified: Secondary | ICD-10-CM | POA: Diagnosis not present

## 2022-07-27 DIAGNOSIS — M545 Low back pain, unspecified: Secondary | ICD-10-CM | POA: Diagnosis not present

## 2022-07-27 DIAGNOSIS — Z9079 Acquired absence of other genital organ(s): Secondary | ICD-10-CM | POA: Diagnosis not present

## 2022-07-27 DIAGNOSIS — C61 Malignant neoplasm of prostate: Secondary | ICD-10-CM | POA: Diagnosis not present

## 2022-07-27 DIAGNOSIS — N5231 Erectile dysfunction following radical prostatectomy: Secondary | ICD-10-CM | POA: Diagnosis not present

## 2022-08-02 ENCOUNTER — Other Ambulatory Visit: Payer: Medicaid Other | Admitting: *Deleted

## 2022-08-03 DIAGNOSIS — Z6826 Body mass index (BMI) 26.0-26.9, adult: Secondary | ICD-10-CM | POA: Diagnosis not present

## 2022-08-03 DIAGNOSIS — R030 Elevated blood-pressure reading, without diagnosis of hypertension: Secondary | ICD-10-CM | POA: Diagnosis not present

## 2022-08-03 DIAGNOSIS — R1031 Right lower quadrant pain: Secondary | ICD-10-CM | POA: Diagnosis not present

## 2022-08-03 DIAGNOSIS — Z79899 Other long term (current) drug therapy: Secondary | ICD-10-CM | POA: Diagnosis not present

## 2022-08-03 DIAGNOSIS — R1032 Left lower quadrant pain: Secondary | ICD-10-CM | POA: Diagnosis not present

## 2022-08-03 DIAGNOSIS — I10 Essential (primary) hypertension: Secondary | ICD-10-CM | POA: Diagnosis not present

## 2022-08-07 DIAGNOSIS — Z79899 Other long term (current) drug therapy: Secondary | ICD-10-CM | POA: Diagnosis not present

## 2022-08-10 DIAGNOSIS — M5416 Radiculopathy, lumbar region: Secondary | ICD-10-CM | POA: Diagnosis not present

## 2022-08-21 ENCOUNTER — Ambulatory Visit: Payer: Medicaid Other | Admitting: Nurse Practitioner

## 2022-08-31 DIAGNOSIS — I10 Essential (primary) hypertension: Secondary | ICD-10-CM | POA: Diagnosis not present

## 2022-08-31 DIAGNOSIS — R030 Elevated blood-pressure reading, without diagnosis of hypertension: Secondary | ICD-10-CM | POA: Diagnosis not present

## 2022-08-31 DIAGNOSIS — Z6826 Body mass index (BMI) 26.0-26.9, adult: Secondary | ICD-10-CM | POA: Diagnosis not present

## 2022-08-31 DIAGNOSIS — Z79899 Other long term (current) drug therapy: Secondary | ICD-10-CM | POA: Diagnosis not present

## 2022-08-31 DIAGNOSIS — R1032 Left lower quadrant pain: Secondary | ICD-10-CM | POA: Diagnosis not present

## 2022-08-31 DIAGNOSIS — R1031 Right lower quadrant pain: Secondary | ICD-10-CM | POA: Diagnosis not present

## 2022-09-04 DIAGNOSIS — Z79899 Other long term (current) drug therapy: Secondary | ICD-10-CM | POA: Diagnosis not present

## 2022-09-14 DIAGNOSIS — Z79899 Other long term (current) drug therapy: Secondary | ICD-10-CM | POA: Diagnosis not present

## 2022-09-14 DIAGNOSIS — Z6825 Body mass index (BMI) 25.0-25.9, adult: Secondary | ICD-10-CM | POA: Diagnosis not present

## 2022-09-14 DIAGNOSIS — I10 Essential (primary) hypertension: Secondary | ICD-10-CM | POA: Diagnosis not present

## 2022-09-14 DIAGNOSIS — R1031 Right lower quadrant pain: Secondary | ICD-10-CM | POA: Diagnosis not present

## 2022-09-14 DIAGNOSIS — R1032 Left lower quadrant pain: Secondary | ICD-10-CM | POA: Diagnosis not present

## 2022-09-14 DIAGNOSIS — R030 Elevated blood-pressure reading, without diagnosis of hypertension: Secondary | ICD-10-CM | POA: Diagnosis not present

## 2022-09-18 DIAGNOSIS — Z79899 Other long term (current) drug therapy: Secondary | ICD-10-CM | POA: Diagnosis not present

## 2022-09-24 ENCOUNTER — Ambulatory Visit: Payer: Medicaid Other | Admitting: Nurse Practitioner

## 2022-10-07 DIAGNOSIS — R7301 Impaired fasting glucose: Secondary | ICD-10-CM | POA: Insufficient documentation

## 2022-10-09 ENCOUNTER — Ambulatory Visit: Payer: Medicaid Other | Admitting: Nurse Practitioner

## 2022-10-09 DIAGNOSIS — N1831 Chronic kidney disease, stage 3a: Secondary | ICD-10-CM

## 2022-10-09 DIAGNOSIS — R801 Persistent proteinuria, unspecified: Secondary | ICD-10-CM

## 2022-10-09 DIAGNOSIS — R7301 Impaired fasting glucose: Secondary | ICD-10-CM

## 2022-10-09 DIAGNOSIS — E212 Other hyperparathyroidism: Secondary | ICD-10-CM

## 2022-10-09 DIAGNOSIS — I10 Essential (primary) hypertension: Secondary | ICD-10-CM

## 2022-10-09 DIAGNOSIS — G8929 Other chronic pain: Secondary | ICD-10-CM

## 2022-10-09 DIAGNOSIS — E7800 Pure hypercholesterolemia, unspecified: Secondary | ICD-10-CM

## 2022-10-12 DIAGNOSIS — Z6825 Body mass index (BMI) 25.0-25.9, adult: Secondary | ICD-10-CM | POA: Diagnosis not present

## 2022-10-12 DIAGNOSIS — R1032 Left lower quadrant pain: Secondary | ICD-10-CM | POA: Diagnosis not present

## 2022-10-12 DIAGNOSIS — Z79899 Other long term (current) drug therapy: Secondary | ICD-10-CM | POA: Diagnosis not present

## 2022-10-12 DIAGNOSIS — I10 Essential (primary) hypertension: Secondary | ICD-10-CM | POA: Diagnosis not present

## 2022-10-12 DIAGNOSIS — R1031 Right lower quadrant pain: Secondary | ICD-10-CM | POA: Diagnosis not present

## 2022-10-12 DIAGNOSIS — R030 Elevated blood-pressure reading, without diagnosis of hypertension: Secondary | ICD-10-CM | POA: Diagnosis not present

## 2022-10-16 ENCOUNTER — Ambulatory Visit: Payer: Medicaid Other | Admitting: Neurology

## 2022-10-16 DIAGNOSIS — Z79899 Other long term (current) drug therapy: Secondary | ICD-10-CM | POA: Diagnosis not present

## 2022-11-09 DIAGNOSIS — R1032 Left lower quadrant pain: Secondary | ICD-10-CM | POA: Diagnosis not present

## 2022-11-09 DIAGNOSIS — Z79899 Other long term (current) drug therapy: Secondary | ICD-10-CM | POA: Diagnosis not present

## 2022-11-09 DIAGNOSIS — R1031 Right lower quadrant pain: Secondary | ICD-10-CM | POA: Diagnosis not present

## 2022-11-09 DIAGNOSIS — I10 Essential (primary) hypertension: Secondary | ICD-10-CM | POA: Diagnosis not present

## 2022-11-09 DIAGNOSIS — R030 Elevated blood-pressure reading, without diagnosis of hypertension: Secondary | ICD-10-CM | POA: Diagnosis not present

## 2022-11-09 DIAGNOSIS — H9191 Unspecified hearing loss, right ear: Secondary | ICD-10-CM | POA: Diagnosis not present

## 2022-11-09 DIAGNOSIS — Z6825 Body mass index (BMI) 25.0-25.9, adult: Secondary | ICD-10-CM | POA: Diagnosis not present

## 2022-11-13 DIAGNOSIS — Z79899 Other long term (current) drug therapy: Secondary | ICD-10-CM | POA: Diagnosis not present

## 2022-11-21 ENCOUNTER — Ambulatory Visit: Payer: Medicaid Other | Admitting: Neurology

## 2022-12-10 DIAGNOSIS — Z6828 Body mass index (BMI) 28.0-28.9, adult: Secondary | ICD-10-CM | POA: Diagnosis not present

## 2022-12-10 DIAGNOSIS — Z79899 Other long term (current) drug therapy: Secondary | ICD-10-CM | POA: Diagnosis not present

## 2022-12-10 DIAGNOSIS — Z20822 Contact with and (suspected) exposure to covid-19: Secondary | ICD-10-CM | POA: Diagnosis not present

## 2022-12-10 DIAGNOSIS — I10 Essential (primary) hypertension: Secondary | ICD-10-CM | POA: Diagnosis not present

## 2022-12-10 DIAGNOSIS — R1032 Left lower quadrant pain: Secondary | ICD-10-CM | POA: Diagnosis not present

## 2022-12-10 DIAGNOSIS — R1031 Right lower quadrant pain: Secondary | ICD-10-CM | POA: Diagnosis not present

## 2022-12-10 DIAGNOSIS — R030 Elevated blood-pressure reading, without diagnosis of hypertension: Secondary | ICD-10-CM | POA: Diagnosis not present

## 2022-12-13 DIAGNOSIS — Z79899 Other long term (current) drug therapy: Secondary | ICD-10-CM | POA: Diagnosis not present

## 2022-12-14 ENCOUNTER — Telehealth: Payer: Self-pay | Admitting: *Deleted

## 2022-12-14 NOTE — Patient Outreach (Signed)
  Care Management   Note  12/14/2022 Name: Todd Craig MRN: [id number] DOB: 01-Aug-1960  [name] is enrolled in a Managed Medicaid plan: Yes. Outreach attempt today was successful.   The patient was given information about care management services as a benefit of their Medicaid health plan today.    Patient                                              and Administrator, Civil Service (DPR) agreed to services and verbal consent obtained.   An initial telephone outreach was scheduled on 01/09/23 at 2:30pm.   Estanislado Emms RN, BSN [place]  [facility] RN Care Coordinator [phone]

## 2022-12-21 DIAGNOSIS — Z6827 Body mass index (BMI) 27.0-27.9, adult: Secondary | ICD-10-CM | POA: Diagnosis not present

## 2022-12-21 DIAGNOSIS — I10 Essential (primary) hypertension: Secondary | ICD-10-CM | POA: Diagnosis not present

## 2022-12-21 DIAGNOSIS — R030 Elevated blood-pressure reading, without diagnosis of hypertension: Secondary | ICD-10-CM | POA: Diagnosis not present

## 2022-12-21 DIAGNOSIS — Z79899 Other long term (current) drug therapy: Secondary | ICD-10-CM | POA: Diagnosis not present

## 2022-12-21 DIAGNOSIS — R1031 Right lower quadrant pain: Secondary | ICD-10-CM | POA: Diagnosis not present

## 2022-12-21 DIAGNOSIS — R1032 Left lower quadrant pain: Secondary | ICD-10-CM | POA: Diagnosis not present

## 2022-12-28 DIAGNOSIS — Z79899 Other long term (current) drug therapy: Secondary | ICD-10-CM | POA: Diagnosis not present

## 2023-01-09 ENCOUNTER — Other Ambulatory Visit: Payer: Medicaid Other | Admitting: *Deleted

## 2023-01-09 NOTE — Patient Outreach (Signed)
Care Coordination  01/09/2023  Todd Craig [birth year] [id number]   Telephone call answered by Todd Craig DPR/Brenda. Todd Craig stated that Todd Craig is not with her at this time and she was not feeling up to talking with [facility] and request to reschedule this telephone visit. A new appointment was made on 01/18/23 at 12:30. DPR/Brenda agreed to new date and time.  Estanislado Emms RN, BSN [place]  [facility] RN Care Coordinator [phone]

## 2023-01-18 ENCOUNTER — Other Ambulatory Visit: Payer: Medicaid Other | Admitting: *Deleted

## 2023-01-18 NOTE — Patient Instructions (Signed)
Visit Information  Mr. Valerian Jewel  - as a part of your Medicaid benefit, you are eligible for care management and care coordination services at no cost or copay. I was unable to reach you by phone today but would be happy to help you with your health related needs. Please feel free to call me @ [phone].   A member of the Managed Medicaid care management team will reach out to you again over the next 7 days.   Estanislado Emms RN, BSN [place]  Value[facility]Based [facility] Institute [facility] RN Care Coordinator [phone]

## 2023-01-18 NOTE — Patient Outreach (Signed)
Medicaid Managed Care   Unsuccessful Attempt Note   01/18/2023 Name: Todd Craig MRN: [id number] DOB: 02/10/1961  Referred by: Marjie Skiff, NP Reason for referral : High Risk Managed Medicaid (Unsuccessful RNCM initial telephone outreach)   An unsuccessful telephone outreach was attempted today. The patient was referred to the case management team for assistance with care management and care coordination.    Follow Up Plan: A HIPAA compliant phone message was left for the patient providing contact information and requesting a return call. and The Managed Medicaid care management team will reach out to the patient again over the next 7 days.    Estanislado Emms RN, BSN [place]  Value-Based Care Institute [facility] RN Care Coordinator [phone]

## 2023-01-19 DIAGNOSIS — R030 Elevated blood-pressure reading, without diagnosis of hypertension: Secondary | ICD-10-CM | POA: Diagnosis not present

## 2023-01-19 DIAGNOSIS — Z6827 Body mass index (BMI) 27.0-27.9, adult: Secondary | ICD-10-CM | POA: Diagnosis not present

## 2023-01-19 DIAGNOSIS — R1031 Right lower quadrant pain: Secondary | ICD-10-CM | POA: Diagnosis not present

## 2023-01-19 DIAGNOSIS — R1032 Left lower quadrant pain: Secondary | ICD-10-CM | POA: Diagnosis not present

## 2023-01-19 DIAGNOSIS — I10 Essential (primary) hypertension: Secondary | ICD-10-CM | POA: Diagnosis not present

## 2023-01-19 DIAGNOSIS — Z79899 Other long term (current) drug therapy: Secondary | ICD-10-CM | POA: Diagnosis not present

## 2023-01-19 DIAGNOSIS — R892 Abnormal level of other drugs, medicaments and biological substances in specimens from other organs, systems and tissues: Secondary | ICD-10-CM | POA: Diagnosis not present

## 2023-01-23 ENCOUNTER — Telehealth: Payer: Self-pay

## 2023-01-23 NOTE — Telephone Encounter (Signed)
Marland Kitchen  Medicaid Managed Care   Unsuccessful Outreach Note  01/23/2023 Name: Todd Craig MRN: [id number] DOB: 1961-02-24  Referred by: Marjie Skiff, NP Reason for referral : Appointment   A second unsuccessful telephone outreach was attempted today. The patient was referred to the case management team for assistance with care management and care coordination.   Follow Up Plan: A HIPAA compliant phone message was left for the patient providing contact information and requesting a return call.  The care management team will reach out to the patient again over the next 7 days.   Weston Settle Care Guide  [facility] Managed  Care Guide [facility]  [phone]

## 2023-01-24 DIAGNOSIS — Z79899 Other long term (current) drug therapy: Secondary | ICD-10-CM | POA: Diagnosis not present

## 2023-01-25 DIAGNOSIS — H90A21 Sensorineural hearing loss, unilateral, right ear, with restricted hearing on the contralateral side: Secondary | ICD-10-CM | POA: Diagnosis not present

## 2023-01-29 ENCOUNTER — Telehealth: Payer: Self-pay

## 2023-01-29 NOTE — Telephone Encounter (Signed)
..  Medicaid Managed Care   Unsuccessful Outreach Note  01/29/2023 Name: Todd Craig MRN: [id number] DOB: 24-Jul-1960  Referred by: Marjie Skiff, NP Reason for referral : Appointment   Third unsuccessful telephone outreach was attempted today. The patient was referred to the case management team for assistance with care management and care coordination. The patient's primary care provider has been notified of our unsuccessful attempts to make or maintain contact with the patient. The care management team is pleased to engage with this patient at any time in the future should he/she be interested in assistance from the care management team.   Follow Up Plan: We have been unable to make contact with the patient for follow up. The care management team is available to follow up with the patient after provider conversation with the patient regarding recommendation for care management engagement and subsequent re-referral to the care management team.   Weston Settle Care Guide  [facility] Managed  Care Guide [facility]  [phone]

## 2023-02-08 ENCOUNTER — Ambulatory Visit: Payer: Medicaid Other | Admitting: Nurse Practitioner

## 2023-02-08 DIAGNOSIS — E212 Other hyperparathyroidism: Secondary | ICD-10-CM

## 2023-02-08 DIAGNOSIS — E663 Overweight: Secondary | ICD-10-CM

## 2023-02-08 DIAGNOSIS — F17290 Nicotine dependence, other tobacco product, uncomplicated: Secondary | ICD-10-CM

## 2023-02-08 DIAGNOSIS — Z789 Other specified health status: Secondary | ICD-10-CM

## 2023-02-08 DIAGNOSIS — E7800 Pure hypercholesterolemia, unspecified: Secondary | ICD-10-CM

## 2023-02-08 DIAGNOSIS — R7301 Impaired fasting glucose: Secondary | ICD-10-CM

## 2023-02-08 DIAGNOSIS — I10 Essential (primary) hypertension: Secondary | ICD-10-CM

## 2023-02-08 DIAGNOSIS — Z8546 Personal history of malignant neoplasm of prostate: Secondary | ICD-10-CM

## 2023-02-08 DIAGNOSIS — Z8673 Personal history of transient ischemic attack (TIA), and cerebral infarction without residual deficits: Secondary | ICD-10-CM

## 2023-02-08 DIAGNOSIS — N1831 Chronic kidney disease, stage 3a: Secondary | ICD-10-CM

## 2023-02-16 NOTE — Patient Instructions (Signed)

## 2023-02-18 DIAGNOSIS — I10 Essential (primary) hypertension: Secondary | ICD-10-CM | POA: Diagnosis not present

## 2023-02-18 DIAGNOSIS — Z6827 Body mass index (BMI) 27.0-27.9, adult: Secondary | ICD-10-CM | POA: Diagnosis not present

## 2023-02-18 DIAGNOSIS — R1032 Left lower quadrant pain: Secondary | ICD-10-CM | POA: Diagnosis not present

## 2023-02-18 DIAGNOSIS — H9193 Unspecified hearing loss, bilateral: Secondary | ICD-10-CM | POA: Diagnosis not present

## 2023-02-18 DIAGNOSIS — R030 Elevated blood-pressure reading, without diagnosis of hypertension: Secondary | ICD-10-CM | POA: Diagnosis not present

## 2023-02-18 DIAGNOSIS — Z79899 Other long term (current) drug therapy: Secondary | ICD-10-CM | POA: Diagnosis not present

## 2023-02-18 DIAGNOSIS — R1031 Right lower quadrant pain: Secondary | ICD-10-CM | POA: Diagnosis not present

## 2023-02-18 DIAGNOSIS — R892 Abnormal level of other drugs, medicaments and biological substances in specimens from other organs, systems and tissues: Secondary | ICD-10-CM | POA: Diagnosis not present

## 2023-02-19 ENCOUNTER — Encounter: Payer: Self-pay | Admitting: Nurse Practitioner

## 2023-02-19 ENCOUNTER — Ambulatory Visit: Payer: Medicaid Other | Admitting: Nurse Practitioner

## 2023-02-19 VITALS — BP 140/88 | HR 66 | Temp 98.7°F | Ht 74.0 in | Wt 217.4 lb

## 2023-02-19 DIAGNOSIS — E212 Other hyperparathyroidism: Secondary | ICD-10-CM

## 2023-02-19 DIAGNOSIS — E663 Overweight: Secondary | ICD-10-CM

## 2023-02-19 DIAGNOSIS — R7301 Impaired fasting glucose: Secondary | ICD-10-CM

## 2023-02-19 DIAGNOSIS — M5441 Lumbago with sciatica, right side: Secondary | ICD-10-CM

## 2023-02-19 DIAGNOSIS — Z8546 Personal history of malignant neoplasm of prostate: Secondary | ICD-10-CM

## 2023-02-19 DIAGNOSIS — R801 Persistent proteinuria, unspecified: Secondary | ICD-10-CM

## 2023-02-19 DIAGNOSIS — E7800 Pure hypercholesterolemia, unspecified: Secondary | ICD-10-CM

## 2023-02-19 DIAGNOSIS — Z1211 Encounter for screening for malignant neoplasm of colon: Secondary | ICD-10-CM

## 2023-02-19 DIAGNOSIS — N1831 Chronic kidney disease, stage 3a: Secondary | ICD-10-CM | POA: Diagnosis not present

## 2023-02-19 DIAGNOSIS — M5442 Lumbago with sciatica, left side: Secondary | ICD-10-CM

## 2023-02-19 DIAGNOSIS — F17290 Nicotine dependence, other tobacco product, uncomplicated: Secondary | ICD-10-CM | POA: Diagnosis not present

## 2023-02-19 DIAGNOSIS — Z789 Other specified health status: Secondary | ICD-10-CM

## 2023-02-19 DIAGNOSIS — E559 Vitamin D deficiency, unspecified: Secondary | ICD-10-CM | POA: Diagnosis not present

## 2023-02-19 DIAGNOSIS — I10 Essential (primary) hypertension: Secondary | ICD-10-CM

## 2023-02-19 DIAGNOSIS — G8929 Other chronic pain: Secondary | ICD-10-CM

## 2023-02-19 LAB — MICROALBUMIN, URINE WAIVED
Creatinine, Urine Waived: 300 mg/dL (ref 10–300)
Microalb, Ur Waived: 150 mg/L — ABNORMAL HIGH (ref 0–19)

## 2023-02-19 MED ORDER — LOSARTAN POTASSIUM 100 MG PO TABS: 100.0000 mg | ORAL_TABLET | Freq: Every day | ORAL | 4 refills | Status: DC

## 2023-02-19 MED ORDER — AMLODIPINE BESYLATE 10 MG PO TABS: 10.0000 mg | ORAL_TABLET | Freq: Every day | ORAL | 4 refills | Status: DC

## 2023-02-19 MED ORDER — ROSUVASTATIN CALCIUM 20 MG PO TABS: 20.0000 mg | ORAL_TABLET | Freq: Every day | ORAL | 4 refills | Status: DC

## 2023-02-19 NOTE — Assessment & Plan Note (Signed)
Chronic, ongoing. Followed by [facility].  Continue medication regimen as ordered by them.

## 2023-02-19 NOTE — Assessment & Plan Note (Signed)
Chronic, ongoing.  BP elevated today as has been out of medication x 1 month.  Refills sent in and highly recommend he not miss doses of medication due to risks without it.  Recommend he monitor BP at least a few mornings a week at home and document.  DASH diet at home.  Continue current medication regimen and adjust as needed.  Labs today.  Return in 6 months.

## 2023-02-19 NOTE — Assessment & Plan Note (Signed)
Ongoing, being followed with nephrology -- recommend they schedule a return visit.  Continue this collaboration, last note and labs reviewed.  Obtain updated labs today.

## 2023-02-19 NOTE — Assessment & Plan Note (Signed)
Ongoing, chronic.  Continue current medication regimen and adjust as needed. Lipid panel today.

## 2023-02-19 NOTE — Assessment & Plan Note (Signed)
Noted past labs, recheck today and start supplement as needed.

## 2023-02-19 NOTE — Assessment & Plan Note (Signed)
Performed 11/28/21.  Is following with urology, continue this collaboration.  Recent note reviewed.

## 2023-02-19 NOTE — Assessment & Plan Note (Signed)
Chronic, ongoing. Recommend he return to nephrology for follow-up.  Will update labs today.  Current kidney failure risk at 5 years is 1.21% on calculation.

## 2023-02-19 NOTE — Assessment & Plan Note (Signed)
In past some elevations in glucose.  Recent A1c stable, will recheck today.

## 2023-02-19 NOTE — Progress Notes (Signed)
BP (!) 140/88 (BP Location: Left Arm, Patient Position: Sitting, Cuff Size: Normal)   Pulse 66   Temp 98.7 F (37.1 C) (Oral)   Ht 6\' 2"  (1.88 m)   Wt 217 lb 6.4 oz (98.6 kg)   SpO2 98%   BMI 27.91 kg/m    Subjective:    Patient ID: Todd Craig, male    DOB: 04-03-1961, 62 y.o.   MRN: [id number]  HPI: Todd Craig is a 62 y.o. male  Chief Complaint  Patient presents with   Chronic Kidney Disease   Hyperlipidemia   Hypertension   Prostate Cancer   Friend at bedside with him.  HYPERTENSION / HYPERLIPIDEMIA Has been off of BP meds for one month and need refills.  Is taking Amlodipine and Losartan + Rosuvastatin.  Has history CVA in 2012 and then again diagnosed with mini strokes per patient's significant other on 09/21/21 -- imaging reviewed. Had visit with neurology on 07/10/22, to have an EEG + they started Lyrica 300 MG BID.  Continues to smoke, close to 1 PPD.  Started smoking after graduated high school.   Satisfied with current treatment? yes Duration of hypertension: chronic BP monitoring frequency: rarely BP range:  BP medication side effects: no Duration of hyperlipidemia: chronic Cholesterol medication side effects: no Cholesterol supplements: none Medication compliance: poor compliance Aspirin: no Recent stressors: no Recurrent headaches: no Visual changes: no Palpitations: no Dyspnea: no Chest pain: no Lower extremity edema: no Dizzy/lightheaded: no   CHRONIC KIDNEY DISEASE (Stage 3a) Is to be following with nephrology, has not seen since 05/08/21. History of low Vitamin D, not taking supplement at present. CKD status: stable Medications renally dose: yes Previous renal evaluation: yes Pneumovax:   Refused Influenza Vaccine:   Refused    Impaired Fasting Glucose HbA1C:  Lab Results  Component Value Date   HGBA1C 4.3 (L) 03/02/2021  Duration of elevated blood sugar:  Polydipsia: no Polyuria: no Weight change: no Visual disturbance: no Glucose  Monitoring: no    Accucheck frequency: Not Checking    Fasting glucose:     Post prandial:  Diabetic Education: Not Completed Family history of diabetes: no   HISTORY OF PROSTATE CANCER Saw urology last in April.  Pain is improving to lower back with pain clinic involvement -- [facility] in [place], last saw yesterday.  Taking Oxycodone + Belbuca. Nocturia: 2-3x per night Urinary frequency:yes Incomplete voiding: no Urgency: yes Weak urinary stream: no Straining to start stream: no Dysuria: no Onset: gradual Severity: mild  Relevant past medical, surgical, family and social history reviewed and updated as indicated. Interim medical history since our last visit reviewed. Allergies and medications reviewed and updated.  Review of Systems  Constitutional:  Negative for appetite change and fever.  HENT:  Negative for congestion.   Respiratory:  Negative for chest tightness, shortness of breath and wheezing.   Cardiovascular:  Negative for chest pain, palpitations and leg swelling.  Genitourinary:  Negative for difficulty urinating.  Musculoskeletal:  Positive for back pain.  Neurological:  Negative for headaches.  Psychiatric/Behavioral:  Negative for agitation, behavioral problems, confusion, self-injury, sleep disturbance and suicidal ideas. The patient is not nervous/anxious.     Per HPI unless specifically indicated above     Objective:    BP (!) 140/88 (BP Location: Left Arm, Patient Position: Sitting, Cuff Size: Normal)   Pulse 66   Temp 98.7 F (37.1 C) (Oral)   Ht 6\' 2"  (1.88 m)   Wt 217 lb 6.4 oz (98.6  kg)   SpO2 98%   BMI 27.91 kg/m   Wt Readings from Last 3 Encounters:  02/19/23 217 lb 6.4 oz (98.6 kg)  07/10/22 196 lb 8 oz (89.1 kg)  06/22/22 199 lb 8 oz (90.5 kg)    Physical Exam Vitals and nursing note reviewed.  Constitutional:      General: He is awake. He is not in acute distress.    Appearance: He is well-developed and well-groomed. He is not  ill-appearing or toxic-appearing.  HENT:     Head: Normocephalic.     Right Ear: Hearing and external ear normal.     Left Ear: Hearing and external ear normal.  Eyes:     General: Lids are normal.     Extraocular Movements: Extraocular movements intact.     Conjunctiva/sclera: Conjunctivae normal.  Neck:     Thyroid: No thyromegaly.     Vascular: No carotid bruit.  Cardiovascular:     Rate and Rhythm: Normal rate and regular rhythm.     Heart sounds: Normal heart sounds. No murmur heard.    No gallop.  Pulmonary:     Effort: No accessory muscle usage or respiratory distress.     Breath sounds: Normal breath sounds.  Abdominal:     General: Bowel sounds are normal. There is no distension.     Palpations: Abdomen is soft.     Tenderness: There is no abdominal tenderness.  Musculoskeletal:     Cervical back: Full passive range of motion without pain.     Right lower leg: No edema.     Left lower leg: No edema.  Lymphadenopathy:     Cervical: No cervical adenopathy.  Skin:    General: Skin is warm.     Capillary Refill: Capillary refill takes less than 2 seconds.  Neurological:     Mental Status: He is alert and oriented to person, place, and time.     Deep Tendon Reflexes: Reflexes are normal and symmetric.     Reflex Scores:      Brachioradialis reflexes are 2+ on the right side and 2+ on the left side.      Patellar reflexes are 2+ on the right side and 2+ on the left side. Psychiatric:        Attention and Perception: Attention normal.        Mood and Affect: Mood normal.        Speech: Speech normal.        Behavior: Behavior normal. Behavior is cooperative.        Thought Content: Thought content normal.     Results for orders placed or performed during the hospital encounter of 02/19/22  Urine Culture   Specimen: Urine, Clean Catch  Result Value Ref Range   Specimen Description      URINE, CLEAN CATCH Performed at [facility], [address]  [place], [place] [address]    Special Requests      NONE Performed at [facility], [address] [place], [place] [address]    Culture (A)     >=100,000 COLONIES/mL ENTEROCOCCUS FAECIUM VANCOMYCIN RESISTANT ENTEROCOCCUS >=100,000 COLONIES/mL YEAST    Report Status 02/24/2022 FINAL    Organism ID, Bacteria ENTEROCOCCUS FAECIUM (A)       Susceptibility   Enterococcus faecium - MIC*    AMPICILLIN >=32 RESISTANT Resistant     NITROFURANTOIN 128 RESISTANT Resistant     VANCOMYCIN >=32 RESISTANT Resistant     LINEZOLID 2 SENSITIVE  Sensitive     * >=100,000 COLONIES/mL ENTEROCOCCUS FAECIUM  Basic metabolic panel  Result Value Ref Range   Sodium 140 135 - 145 mmol/L   Potassium 4.6 3.5 - 5.1 mmol/L   Chloride 114 (H) 98 - 111 mmol/L   CO2 22 22 - 32 mmol/L   Glucose, Bld 123 (H) 70 - 99 mg/dL   BUN 16 6 - 20 mg/dL   Creatinine, Ser 1.61 (H) 0.61 - 1.24 mg/dL   Calcium 09.6 8.9 - 04.5 mg/dL   GFR, Estimated 58 (L) >60 mL/min   Anion gap 4 (L) 5 - 15  CBC  Result Value Ref Range   WBC 15.3 (H) 4.0 - 10.5 K/uL   RBC 3.67 (L) 4.22 - 5.81 MIL/uL   Hemoglobin 10.9 (L) 13.0 - 17.0 g/dL   HCT 40.9 (L) 81.1 - 91.4 %   MCV 95.1 80.0 - 100.0 fL   MCH 29.7 26.0 - 34.0 pg   MCHC 31.2 30.0 - 36.0 g/dL   RDW 78.2 95.6 - 21.3 %   Platelets 352 150 - 400 K/uL   nRBC 0.0 0.0 - 0.2 %  Urinalysis, Routine w reflex microscopic Urine, Unspecified Source  Result Value Ref Range   Color, Urine YELLOW (A) YELLOW   APPearance HAZY (A) CLEAR   Specific Gravity, Urine 1.014 1.005 - 1.030   pH 5.0 5.0 - 8.0   Glucose, UA NEGATIVE NEGATIVE mg/dL   Hgb urine dipstick SMALL (A) NEGATIVE   Bilirubin Urine NEGATIVE NEGATIVE   Ketones, ur NEGATIVE NEGATIVE mg/dL   Protein, ur 30 (A) NEGATIVE mg/dL   Nitrite NEGATIVE NEGATIVE   Leukocytes,Ua SMALL (A) NEGATIVE   RBC / HPF 21-50 0 - 5 RBC/hpf   WBC, UA 21-50 0 - 5 WBC/hpf   Bacteria, UA RARE (A) NONE SEEN   Squamous Epithelial / HPF NONE SEEN 0  - 5   Mucus PRESENT    Budding Yeast PRESENT   Troponin I (High Sensitivity)  Result Value Ref Range   Troponin I (High Sensitivity) 13 <18 ng/L  Troponin I (High Sensitivity)  Result Value Ref Range   Troponin I (High Sensitivity) 13 <18 ng/L      Assessment & Plan:   Problem List Items Addressed This Visit       Cardiovascular and Mediastinum   Primary hypertension    Chronic, ongoing.  BP elevated today as has been out of medication x 1 month.  Refills sent in and highly recommend he not miss doses of medication due to risks without it.  Recommend he monitor BP at least a few mornings a week at home and document.  DASH diet at home.  Continue current medication regimen and adjust as needed.  Labs today.  Return in 6 months.       Relevant Medications   amLODipine (NORVASC) 10 MG tablet   losartan (COZAAR) 100 MG tablet   rosuvastatin (CRESTOR) 20 MG tablet   Other Relevant Orders   CBC with Differential/Platelet   Comprehensive metabolic panel   TSH     Endocrine   IFG (impaired fasting glucose)    In past some elevations in glucose.  Recent A1c stable, will recheck today.      Relevant Orders   HgB A1c   Other hyperparathyroidism (HCC)    Ongoing, being followed with nephrology -- recommend they schedule a return visit.  Continue this collaboration, last note and labs reviewed.  Obtain updated labs today.  Relevant Orders   PTH, intact and calcium     Nervous and Auditory   Chronic midline low back pain with bilateral sciatica    Chronic, ongoing. Followed by [facility].  Continue medication regimen as ordered by them.        Genitourinary   Stage 3a chronic kidney disease (HCC) - Primary    Chronic, ongoing. Recommend he return to nephrology for follow-up.  Will update labs today.  Current kidney failure risk at 5 years is 1.21% on calculation.      Relevant Orders   Comprehensive metabolic panel   Microalbumin, Urine Waived     Other    History of prostate cancer    Performed 11/28/21.  Is following with urology, continue this collaboration.  Recent note reviewed.      Hypercholesteremia    Ongoing, chronic.  Continue current medication regimen and adjust as needed. Lipid panel today.      Relevant Medications   amLODipine (NORVASC) 10 MG tablet   losartan (COZAAR) 100 MG tablet   rosuvastatin (CRESTOR) 20 MG tablet   Other Relevant Orders   Comprehensive metabolic panel   Lipid Panel w/o Chol/HDL Ratio   Nicotine dependence due to vaping tobacco product    I have recommended complete cessation of tobacco use. I have discussed various options available for assistance with tobacco cessation including over the counter methods (Nicotine gum, patch and lozenges). We also discussed prescription options (Chantix, Nicotine Inhaler / Nasal Spray). The patient is not interested in pursuing any prescription tobacco cessation options at this time.  Lung cancer screening ordered.       Relevant Orders   Ambulatory Referral Lung Cancer Screening [facility]   Persistent proteinuria    Chronic, ongoing. Recommend he return to nephrology for follow-up.  Will update labs today.  Current kidney failure risk at 5 years is 1.21% on calculation.      Vitamin D deficiency    Noted past labs, recheck today and start supplement as needed.      Relevant Orders   VITAMIN D 25 Hydroxy (Vit-D Deficiency, Fractures)   Other Visit Diagnoses     Colon cancer screening       GI referral placed   Relevant Orders   Ambulatory referral to Gastroenterology        Follow up plan: Return in about 6 months (around 08/19/2023) for HTN/HLD, CKD, CHRONIC PAIN, IFG.

## 2023-02-19 NOTE — Assessment & Plan Note (Signed)
I have recommended complete cessation of tobacco use. I have discussed various options available for assistance with tobacco cessation including over the counter methods (Nicotine gum, patch and lozenges). We also discussed prescription options (Chantix, Nicotine Inhaler / Nasal Spray). The patient is not interested in pursuing any prescription tobacco cessation options at this time.  Lung cancer screening ordered.

## 2023-02-20 LAB — COMPREHENSIVE METABOLIC PANEL
ALT: 13 [IU]/L (ref 0–44)
AST: 16 [IU]/L (ref 0–40)
Albumin: 4.2 g/dL (ref 3.9–4.9)
Alkaline Phosphatase: 115 [IU]/L (ref 44–121)
BUN/Creatinine Ratio: 9 — ABNORMAL LOW (ref 10–24)
BUN: 11 mg/dL (ref 8–27)
Bilirubin Total: 0.5 mg/dL (ref 0.0–1.2)
CO2: 24 mmol/L (ref 20–29)
Calcium: 11 mg/dL — ABNORMAL HIGH (ref 8.6–10.2)
Chloride: 104 mmol/L (ref 96–106)
Creatinine, Ser: 1.29 mg/dL — ABNORMAL HIGH (ref 0.76–1.27)
Globulin, Total: 3.1 g/dL (ref 1.5–4.5)
Glucose: 75 mg/dL (ref 70–99)
Potassium: 4.4 mmol/L (ref 3.5–5.2)
Sodium: 141 mmol/L (ref 134–144)
Total Protein: 7.3 g/dL (ref 6.0–8.5)
eGFR: 63 mL/min/{1.73_m2} (ref >59)

## 2023-02-20 LAB — PTH, INTACT AND CALCIUM: PTH: 61 pg/mL (ref 15–65)

## 2023-02-20 LAB — CBC WITH DIFFERENTIAL/PLATELET
Basophils Absolute: 0.1 10*3/uL (ref 0.0–0.2)
Basos: 1 %
EOS (ABSOLUTE): 0.2 10*3/uL (ref 0.0–0.4)
Eos: 2 %
Hematocrit: 46.4 % (ref 37.5–51.0)
Hemoglobin: 15.3 g/dL (ref 13.0–17.7)
Immature Grans (Abs): 0 10*3/uL (ref 0.0–0.1)
Immature Granulocytes: 0 %
Lymphocytes Absolute: 1.9 10*3/uL (ref 0.7–3.1)
Lymphs: 19 %
MCH: 32.9 pg (ref 26.6–33.0)
MCHC: 33 g/dL (ref 31.5–35.7)
MCV: 100 fL — ABNORMAL HIGH (ref 79–97)
Monocytes Absolute: 0.5 10*3/uL (ref 0.1–0.9)
Monocytes: 5 %
Neutrophils Absolute: 7 10*3/uL (ref 1.4–7.0)
Neutrophils: 73 %
Platelets: 233 10*3/uL (ref 150–450)
RBC: 4.65 x10E6/uL (ref 4.14–5.80)
RDW: 12 % (ref 11.6–15.4)
WBC: 9.6 10*3/uL (ref 3.4–10.8)

## 2023-02-20 LAB — HEMOGLOBIN A1C
Est. average glucose Bld gHb Est-mCnc: 94 mg/dL
Hgb A1c MFr Bld: 4.9 % (ref 4.8–5.6)

## 2023-02-20 LAB — VITAMIN D 25 HYDROXY (VIT D DEFICIENCY, FRACTURES): Vit D, 25-Hydroxy: 14.6 ng/mL — ABNORMAL LOW (ref 30.0–100.0)

## 2023-02-20 LAB — LIPID PANEL W/O CHOL/HDL RATIO
Cholesterol, Total: 141 mg/dL (ref 100–199)
HDL: 39 mg/dL — ABNORMAL LOW (ref >39)
LDL Chol Calc (NIH): 78 mg/dL (ref 0–99)
Triglycerides: 133 mg/dL (ref 0–149)
VLDL Cholesterol Cal: 24 mg/dL (ref 5–40)

## 2023-02-20 LAB — TSH: TSH: 0.534 u[IU]/mL (ref 0.450–4.500)

## 2023-02-20 NOTE — Progress Notes (Signed)
Good morning, please let Todd Craig or his other half know labs have returned: - CBC shows no anemia or infection. - Kidney function, creatinine and eGFR, is stable, as is liver function, AST and ALT.  - Calcium level a bit too elevated, please cut back on any TUMS or calcium rich foods for a bit. - Cholesterol levels look better then last check, continue Rosuvastatin. - No diabetes or prediabetes + thyroid is normal. - Vitamin D level remains low, I do recommend you take a Vitamin D3 supplement over the counter.  2000 units daily.  Any questions? Keep being excellent!!  Thank you for allowing me to participate in your care.  I appreciate you. Kindest regards, Amyrie Illingworth

## 2023-02-22 DIAGNOSIS — Z79899 Other long term (current) drug therapy: Secondary | ICD-10-CM | POA: Diagnosis not present

## 2023-02-28 ENCOUNTER — Encounter: Payer: Self-pay | Admitting: *Deleted

## 2023-03-04 DIAGNOSIS — Z6827 Body mass index (BMI) 27.0-27.9, adult: Secondary | ICD-10-CM | POA: Diagnosis not present

## 2023-03-04 DIAGNOSIS — R1032 Left lower quadrant pain: Secondary | ICD-10-CM | POA: Diagnosis not present

## 2023-03-04 DIAGNOSIS — H9193 Unspecified hearing loss, bilateral: Secondary | ICD-10-CM | POA: Diagnosis not present

## 2023-03-04 DIAGNOSIS — R892 Abnormal level of other drugs, medicaments and biological substances in specimens from other organs, systems and tissues: Secondary | ICD-10-CM | POA: Diagnosis not present

## 2023-03-04 DIAGNOSIS — Z79899 Other long term (current) drug therapy: Secondary | ICD-10-CM | POA: Diagnosis not present

## 2023-03-04 DIAGNOSIS — R1031 Right lower quadrant pain: Secondary | ICD-10-CM | POA: Diagnosis not present

## 2023-03-04 DIAGNOSIS — I10 Essential (primary) hypertension: Secondary | ICD-10-CM | POA: Diagnosis not present

## 2023-03-04 DIAGNOSIS — R030 Elevated blood-pressure reading, without diagnosis of hypertension: Secondary | ICD-10-CM | POA: Diagnosis not present

## 2023-03-13 DIAGNOSIS — Z79899 Other long term (current) drug therapy: Secondary | ICD-10-CM | POA: Diagnosis not present

## 2023-03-22 DIAGNOSIS — Z6834 Body mass index (BMI) 34.0-34.9, adult: Secondary | ICD-10-CM | POA: Diagnosis not present

## 2023-03-22 DIAGNOSIS — Z79899 Other long term (current) drug therapy: Secondary | ICD-10-CM | POA: Diagnosis not present

## 2023-03-22 DIAGNOSIS — R030 Elevated blood-pressure reading, without diagnosis of hypertension: Secondary | ICD-10-CM | POA: Diagnosis not present

## 2023-03-22 DIAGNOSIS — E6609 Other obesity due to excess calories: Secondary | ICD-10-CM | POA: Diagnosis not present

## 2023-03-22 DIAGNOSIS — R1032 Left lower quadrant pain: Secondary | ICD-10-CM | POA: Diagnosis not present

## 2023-03-22 DIAGNOSIS — R892 Abnormal level of other drugs, medicaments and biological substances in specimens from other organs, systems and tissues: Secondary | ICD-10-CM | POA: Diagnosis not present

## 2023-03-22 DIAGNOSIS — I10 Essential (primary) hypertension: Secondary | ICD-10-CM | POA: Diagnosis not present

## 2023-03-22 DIAGNOSIS — H9193 Unspecified hearing loss, bilateral: Secondary | ICD-10-CM | POA: Diagnosis not present

## 2023-03-22 DIAGNOSIS — R1031 Right lower quadrant pain: Secondary | ICD-10-CM | POA: Diagnosis not present

## 2023-03-26 DIAGNOSIS — Z79899 Other long term (current) drug therapy: Secondary | ICD-10-CM | POA: Diagnosis not present

## 2023-04-22 DIAGNOSIS — Z6828 Body mass index (BMI) 28.0-28.9, adult: Secondary | ICD-10-CM | POA: Diagnosis not present

## 2023-04-22 DIAGNOSIS — R1031 Right lower quadrant pain: Secondary | ICD-10-CM | POA: Diagnosis not present

## 2023-04-22 DIAGNOSIS — R030 Elevated blood-pressure reading, without diagnosis of hypertension: Secondary | ICD-10-CM | POA: Diagnosis not present

## 2023-04-22 DIAGNOSIS — R1032 Left lower quadrant pain: Secondary | ICD-10-CM | POA: Diagnosis not present

## 2023-04-22 DIAGNOSIS — R892 Abnormal level of other drugs, medicaments and biological substances in specimens from other organs, systems and tissues: Secondary | ICD-10-CM | POA: Diagnosis not present

## 2023-04-22 DIAGNOSIS — H9193 Unspecified hearing loss, bilateral: Secondary | ICD-10-CM | POA: Diagnosis not present

## 2023-04-22 DIAGNOSIS — Z79899 Other long term (current) drug therapy: Secondary | ICD-10-CM | POA: Diagnosis not present

## 2023-04-22 DIAGNOSIS — I10 Essential (primary) hypertension: Secondary | ICD-10-CM | POA: Diagnosis not present

## 2023-04-25 DIAGNOSIS — Z79899 Other long term (current) drug therapy: Secondary | ICD-10-CM | POA: Diagnosis not present

## 2023-04-30 ENCOUNTER — Telehealth: Payer: Self-pay | Admitting: Neurology

## 2023-04-30 NOTE — Telephone Encounter (Signed)
 Pt's friend, Eusebio Friendly called to reschedule appopointment due to some one have to be there where the bus bring her home. Rescheduled appointment on 05/07/23

## 2023-05-01 ENCOUNTER — Ambulatory Visit: Payer: Medicaid Other | Admitting: Neurology

## 2023-05-07 ENCOUNTER — Ambulatory Visit: Payer: Medicaid Other | Admitting: Neurology

## 2023-05-07 ENCOUNTER — Encounter: Payer: Self-pay | Admitting: Neurology

## 2023-05-07 VITALS — BP 152/90 | HR 80 | Ht 74.0 in | Wt 226.0 lb

## 2023-05-07 DIAGNOSIS — I6381 Other cerebral infarction due to occlusion or stenosis of small artery: Secondary | ICD-10-CM | POA: Diagnosis not present

## 2023-05-07 DIAGNOSIS — R55 Syncope and collapse: Secondary | ICD-10-CM

## 2023-05-07 DIAGNOSIS — M545 Low back pain, unspecified: Secondary | ICD-10-CM | POA: Diagnosis not present

## 2023-05-07 DIAGNOSIS — D329 Benign neoplasm of meninges, unspecified: Secondary | ICD-10-CM | POA: Diagnosis not present

## 2023-05-07 DIAGNOSIS — M5417 Radiculopathy, lumbosacral region: Secondary | ICD-10-CM | POA: Diagnosis not present

## 2023-05-07 NOTE — Progress Notes (Signed)
[facility]  PATIENT: Todd Craig DOB: 07-13-1960  REQUESTING CLINICIAN: Marjie Skiff, NP HISTORY FROM: Patient and fiancee  REASON FOR VISIT: Stroke follow up   HISTORICAL  CHIEF COMPLAINT:  Chief Complaint  Patient presents with   Follow-up    Pt in 63, here with wife Steward Drone Pt is here for follow up on CVA. Pt's wife states pt continues with low back pain and leg pain.    INTERVAL HISTORY 05/07/2023:  Patient presents today for follow-up.  He is accompanied by wife.  Last visit was in March 2024.  At that time plan was to continue his current medications for stroke prevention.  He was complaining of severe back pain.  He is still complaining of severe back pain worse with any movements.  He has seen multiple doctors including chiropractor, pain management but his pain is still bothering him.  He has done a few ESI injection but reports no benefits. He reports being out of pain medication about 10 days ago and he is scheduled for next refill at the end of the month.   INTERVAL HISTORY 07/10/2022:  Patient presents today for follow-up, he is accompanied by wife.  Last visit was in July of last year.  Wife reports in August last year patient was diagnosed with prostate cancer, had procedure done, but his course was complicated by UTI, and back pain.  Currently he is experiencing severe back pain that radiate down to the right leg.  He did follow-up with orthopedist, had MRI done showing:  1. Severe left mixed L3-L4 neural foraminal narrowing compressing the exiting left L3 nerve root related to endplate ridging. 2. Mixed Modic type 1-2 endplate changes at L3-L4 and L4-L5. 3. Paraspinal sarcopenia.  He is pending ESI injection.   Wife reports that he still experience episode of severe diaphoresis, collapse and confusion. He was not able to complete the ambulatory EEG.    HISTORY OF PRESENT ILLNESS:  This is a 63 year old gentleman past medical history of  hypertension, hyperlipidemia, stroke in 2012 when he presented for right arm weakness who is presenting with abnormal MRI.  Patient reports experiencing hearing loss in his right ear, he did follow-up with audiology and ENT and had an MRI which showed an acute right frontal stroke but multiple lacunar strokes.  Due to those abnormalities, he was referred to neurology.  At the moment he does not report any focal neurological deficit, denies any abnormalities and has no complaints.  Neysa Bonito has reported 2 episodes of syncope, the last one being on June 1 while he was in the barbershop.  After each episode patient is confused and profusely sweating.  There are no report of abnormal movement   OTHER MEDICAL CONDITIONS: Hearing loss, hypertension    REVIEW OF SYSTEMS: Full 14 system review of systems performed and negative with exception of: as noted in the HPI   ALLERGIES: Allergies  Allergen Reactions   Tramadol     Other reaction(s): Other    HOME MEDICATIONS: Outpatient Medications Prior to Visit  Medication Sig Dispense Refill   amLODipine (NORVASC) 10 MG tablet Take 1 tablet (10 mg total) by mouth daily. 90 tablet 4   BELBUCA 300 MCG FILM Take 300 mcg by mouth 2 (two) times daily.     losartan (COZAAR) 100 MG tablet Take 1 tablet (100 mg total) by mouth daily. 90 tablet 4   oxyCODONE (ROXICODONE) 15 MG immediate release tablet Take 15 mg by mouth 4 (four) times daily  as needed.     rosuvastatin (CRESTOR) 20 MG tablet Take 1 tablet (20 mg total) by mouth daily. 90 tablet 4   colchicine 0.6 MG tablet Take 1 tablet (0.6 mg total) by mouth every 2 (two) hours as needed (gout pain). (Patient not taking: Reported on 05/07/2023) 4 tablet 12   naloxone (NARCAN) nasal spray 4 mg/0.1 mL Place 1 spray into the nose once. (Patient not taking: Reported on 05/07/2023)     pregabalin (LYRICA) 300 MG capsule Take 1 capsule (300 mg total) by mouth 2 (two) times daily. 60 capsule 0   No facility-administered  medications prior to visit.    PAST MEDICAL HISTORY: Past Medical History:  Diagnosis Date   Deaf, right    GERD (gastroesophageal reflux disease)    Hypertension    Hypertensive crisis, unspecified 01/11/2020   Stroke (HCC) 2014    PAST SURGICAL HISTORY: Past Surgical History:  Procedure Laterality Date   PROSTATE BIOPSY N/A 08/01/2021   Procedure: PROSTATE BIOPSY;  Surgeon: Riki Altes, MD;  Location: [facility] ORS;  Service: Urology;  Laterality: N/A;   PROSTATE SURGERY     august /2023, for prostate cancer   right hand repair  1985   trauma with saw laceration    TRANSRECTAL ULTRASOUND N/A 08/01/2021   Procedure: TRANSRECTAL ULTRASOUND;  Surgeon: Riki Altes, MD;  Location: [facility] ORS;  Service: Urology;  Laterality: N/A;    FAMILY HISTORY: History reviewed. No pertinent family history.  SOCIAL HISTORY: Social History   Socioeconomic History   Marital status: Single    Spouse name: Not on file   Number of children: Not on file   Years of education: Not on file   Highest education level: Not on file  Occupational History   Not on file  Tobacco Use   Smoking status: Every Day    Current packs/day: 0.50    Types: Cigarettes   Smokeless tobacco: Never   Tobacco comments:    Pt states he Smokes 1/2 pack a day   Vaping Use   Vaping status: Never Used  Substance and Sexual Activity   Alcohol use: Yes    Alcohol/week: 5.0 standard drinks of alcohol    Types: 5 Cans of beer per week    Comment: on weekends   Drug use: No   Sexual activity: Yes  Other Topics Concern   Not on file  Social History Narrative   Not on file   Social Drivers of Health   Financial Resource Strain: Low Risk  (12/07/2021)   Received from [facility], [facility]   Overall Financial Resource Strain (CARDIA)    Difficulty of Paying Living Expenses: Not hard at all  Food Insecurity: No Food Insecurity (12/07/2021)   Received from [facility], [facility]   Hunger  Vital Sign    Worried About Running Out of Food in the Last Year: Never true    Ran Out of Food in the Last Year: Never true  Transportation Needs: No Transportation Needs (12/07/2021)   Received from [facility], [facility]   [facility] - Transportation    Lack of Transportation (Medical): No    Lack of Transportation (Non-Medical): No  Physical Activity: Sufficiently Active (03/02/2021)   Exercise Vital Sign    Days of Exercise per Week: 4 days    Minutes of Exercise per Session: 40 min  Stress: No Stress Concern Present (03/02/2021)   [employer] of Occupational Health - Occupational  Stress Questionnaire    Feeling of Stress : Only a little  Social Connections: Moderately Integrated (03/02/2021)   Social Connection and Isolation Panel [NHANES]    Frequency of Communication with Friends and Family: More than three times a week    Frequency of Social Gatherings with Friends and Family: More than three times a week    Attends Religious Services: More than 4 times per year    Active Member of [employer] or Organizations: No    Attends Banker Meetings: Never    Marital Status: Living with partner  Intimate Partner Violence: Not At Risk (03/02/2021)   Humiliation, Afraid, Rape, and Kick questionnaire    Fear of Current or Ex-Partner: No    Emotionally Abused: No    Physically Abused: No    Sexually Abused: No     PHYSICAL EXAM  GENERAL EXAM/CONSTITUTIONAL: Vitals:  Vitals:   05/07/23 1453 05/07/23 1505  BP: (!) 154/92 (!) 152/90  Pulse: 80   Weight: 226 lb (102.5 kg)   Height: 6\' 2"  (1.88 m)    Body mass index is 29.02 kg/m. Wt Readings from Last 3 Encounters:  05/07/23 226 lb (102.5 kg)  02/19/23 217 lb 6.4 oz (98.6 kg)  07/10/22 196 lb 8 oz (89.1 kg)   Patient is in no distress; well developed, nourished and groomed; neck is supple. In pain and appears uncomfortable.   MUSCULOSKELETAL: Gait, strength, tone, movements noted in Neurologic exam  below  NEUROLOGIC: MENTAL STATUS:      No data to display         awake, alert, oriented to person, place and time recent and remote memory intact normal attention and concentration language fluent, comprehension intact, naming intact fund of knowledge appropriate  CRANIAL NERVE:  2nd, 3rd, 4th, 6th - visual fields full to confrontation, extraocular muscles intact, no nystagmus 5th - facial sensation symmetric 7th - facial strength symmetric 8th - hearing intact 9th - palate elevates symmetrically, uvula midline 11th - shoulder shrug symmetric 12th - tongue protrusion midline  MOTOR:  normal bulk and tone, full strength in the BUE, BLE but limited by pain. Normal reflexes in the BLE   SENSORY:  normal and symmetric to light touch  GAIT/STATION:  Antalgic     DIAGNOSTIC DATA (LABS, IMAGING, TESTING) - I reviewed patient records, labs, notes, testing and imaging myself where available.  Lab Results  Component Value Date   WBC 9.6 02/19/2023   HGB 15.3 02/19/2023   HCT 46.4 02/19/2023   MCV 100 (H) 02/19/2023   PLT 233 02/19/2023      Component Value Date/Time   NA 141 02/19/2023 1346   K 4.4 02/19/2023 1346   K 4.0 05/24/2011 0640   CL 104 02/19/2023 1346   CO2 24 02/19/2023 1346   GLUCOSE 75 02/19/2023 1346   GLUCOSE 123 (H) 02/19/2022 1155   BUN 11 02/19/2023 1346   CREATININE 1.29 (H) 02/19/2023 1346   CALCIUM 11.0 (H) 02/19/2023 1346   PROT 7.3 02/19/2023 1346   ALBUMIN 4.2 02/19/2023 1346   AST 16 02/19/2023 1346   ALT 13 02/19/2023 1346   ALKPHOS 115 02/19/2023 1346   BILITOT 0.5 02/19/2023 1346   GFRNONAA 58 (L) 02/19/2022 1155   GFRAA >60 01/11/2020 1300   Lab Results  Component Value Date   CHOL 141 02/19/2023   HDL 39 (L) 02/19/2023   LDLCALC 78 02/19/2023   TRIG 133 02/19/2023   CHOLHDL 4 02/16/2020   Lab Results  Component Value Date   HGBA1C 4.9 02/19/2023   No results found for: "VITAMINB12" Lab Results  Component Value  Date   TSH 0.534 02/19/2023     MRI Brain 09/22/21 1. 4 mm acute subcortical infarct in the right frontal lobe. 2. Negative internal auditory canal imaging. No retrocochlear lesion. 3. Severe chronic small vessel ischemic disease, progressed from 2012. 4. 5 mm extra-axial mass along the falx consistent with a meningioma. No mass effect or brain edema. 5. Innumerable chronic microhemorrhages compatible with chronic hypertension.   MRI Lumbar spine  1. Severe left mixed L3-L4 neural foraminal narrowing compressing the exiting left L3 nerve root related to endplate ridging.  2. Mixed Modic type 1-2 endplate changes at L3-L4 and L4-L5.  3. Paraspinal sarcopenia.    ASSESSMENT AND PLAN  63 y.o. year old male with vascular risk factors including hypertension, hyperlipidemia, prostate cancer, who is presenting for follow-up for his previous stroke and episode concerning for seizures.  In terms of the previous stroke, wife reports that he is stable, he is compliant with his medications. For his episode of confusion and collapse, he was unable to complete the EEG. Wife denies any new symptoms.  For his back pain, he is agreeable with neurosurgery consultation. We will repeat his back MRI and send him to [facility] neurosurgery. Continue to follow up with pain management, PCP and return as needed    1. Cerebrovascular accident (CVA) due to occlusion of small artery (HCC)   2. Syncope, unspecified syncope type   3. Meningioma (HCC)   4. Acute midline low back pain, unspecified whether sciatica present   5. Lumbosacral radiculopathy      Patient Instructions  Continue current medications Continue follow-up with your doctors Will obtain a updated lumbar spine MRI Referral to neurosurgery, Dr. Myer Haff for possible lumbar surgery Continue follow-up PCP Return as needed.  Orders Placed This Encounter  Procedures   MR LUMBAR SPINE WO CONTRAST   Ambulatory referral to Neurosurgery    No  orders of the defined types were placed in this encounter.   Return if symptoms worsen or fail to improve.    Windell Norfolk, MD 05/07/2023, 5:13 PM  [facility] [address], [address] [place], [place] [address] [phone]

## 2023-05-07 NOTE — Patient Instructions (Signed)
Continue current medications Continue follow-up with your doctors Will obtain a updated lumbar spine MRI Referral to neurosurgery, Dr. Myer Haff for possible lumbar surgery Continue follow-up PCP Return as needed.

## 2023-05-08 ENCOUNTER — Telehealth: Payer: Self-pay | Admitting: Neurology

## 2023-05-08 NOTE — Telephone Encounter (Signed)
Referral for neurosurgery sent through [facility] to [facility]. Phone: [phone]

## 2023-05-14 ENCOUNTER — Telehealth: Payer: Self-pay | Admitting: Neurology

## 2023-05-14 NOTE — Telephone Encounter (Signed)
healthy blue Berkley Harvey: [id number] exp. 05/14/23-07/12/23 sent to GI [phone]

## 2023-05-16 DIAGNOSIS — R1031 Right lower quadrant pain: Secondary | ICD-10-CM | POA: Diagnosis not present

## 2023-05-16 DIAGNOSIS — Z79899 Other long term (current) drug therapy: Secondary | ICD-10-CM | POA: Diagnosis not present

## 2023-05-16 DIAGNOSIS — R1032 Left lower quadrant pain: Secondary | ICD-10-CM | POA: Diagnosis not present

## 2023-05-16 DIAGNOSIS — R892 Abnormal level of other drugs, medicaments and biological substances in specimens from other organs, systems and tissues: Secondary | ICD-10-CM | POA: Diagnosis not present

## 2023-05-28 ENCOUNTER — Ambulatory Visit
Admission: RE | Admit: 2023-05-28 | Discharge: 2023-05-28 | Disposition: A | Discharge status: Home / Self Care | Payer: Medicaid Other | Source: Ambulatory Visit | Attending: Neurology | Admitting: Neurology

## 2023-05-28 DIAGNOSIS — M5417 Radiculopathy, lumbosacral region: Secondary | ICD-10-CM

## 2023-05-28 DIAGNOSIS — M545 Low back pain, unspecified: Secondary | ICD-10-CM

## 2023-05-30 ENCOUNTER — Other Ambulatory Visit: Payer: Medicaid Other

## 2023-05-30 NOTE — Progress Notes (Signed)
Please call and advise the patient that the recent lumbar spine MRI showed some degenerative arthritis changes and narrowing of the nerve exit. Advised them to follow up with Neurosurgery at .  Please also inform them that the MRI showed a kidney cyst and that he needs to follow up with PCP. Please remind patient to keep any upcoming appointments or tests and to call us with any interim questions, concerns, problems or updates. Thanks,   Windell Norfolk, MD

## 2023-06-02 NOTE — Patient Instructions (Incomplete)

## 2023-06-05 ENCOUNTER — Ambulatory Visit: Payer: Medicaid Other | Admitting: Nurse Practitioner

## 2023-06-08 NOTE — Patient Instructions (Signed)

## 2023-06-10 ENCOUNTER — Encounter: Payer: Self-pay | Admitting: Nurse Practitioner

## 2023-06-10 ENCOUNTER — Ambulatory Visit: Payer: Medicaid Other | Admitting: Nurse Practitioner

## 2023-06-10 ENCOUNTER — Telehealth: Payer: Self-pay

## 2023-06-10 VITALS — BP 136/82 | HR 72 | Temp 98.4°F | Ht 74.2 in | Wt 227.4 lb

## 2023-06-10 DIAGNOSIS — I10 Essential (primary) hypertension: Secondary | ICD-10-CM

## 2023-06-10 DIAGNOSIS — M5441 Lumbago with sciatica, right side: Secondary | ICD-10-CM | POA: Diagnosis not present

## 2023-06-10 DIAGNOSIS — N1831 Chronic kidney disease, stage 3a: Secondary | ICD-10-CM | POA: Diagnosis not present

## 2023-06-10 DIAGNOSIS — M5442 Lumbago with sciatica, left side: Secondary | ICD-10-CM | POA: Diagnosis not present

## 2023-06-10 DIAGNOSIS — E7800 Pure hypercholesterolemia, unspecified: Secondary | ICD-10-CM | POA: Diagnosis not present

## 2023-06-10 DIAGNOSIS — G8929 Other chronic pain: Secondary | ICD-10-CM

## 2023-06-10 MED ORDER — ROSUVASTATIN CALCIUM 20 MG PO TABS: 20.0000 mg | ORAL_TABLET | Freq: Every day | ORAL | 4 refills | Status: DC

## 2023-06-10 MED ORDER — OXYCODONE HCL 15 MG PO TABS: 15.0000 mg | ORAL_TABLET | Freq: Four times a day (QID) | ORAL | 0 refills | Status: AC | PRN

## 2023-06-10 MED ORDER — LOSARTAN POTASSIUM-HCTZ 100-12.5 MG PO TABS: 1.0000 | ORAL_TABLET | Freq: Every day | ORAL | 1 refills | Status: DC

## 2023-06-10 MED ORDER — AMLODIPINE BESYLATE 10 MG PO TABS: 10.0000 mg | ORAL_TABLET | Freq: Every day | ORAL | 4 refills | Status: DC

## 2023-06-10 NOTE — Assessment & Plan Note (Addendum)
 Chronic, ongoing. Was followed by [facility], but can no longer attend there. Referral placed for new pain clinic to takeover his Oxycodone Ir.  Will send in 30 day supply at present to bridge him until pain clinic visit, reduce to Q6H PRN however.  Pain medication offers benefit to his daily life and ADLs.  Follows with neurosurgery at present, continue this collaboration, recent notes reviewed.

## 2023-06-10 NOTE — Progress Notes (Signed)
 BP 136/82 (BP Location: Left Arm, Cuff Size: Normal)   Pulse 72   Temp 98.4 F (36.9 C) (Oral)   Ht 6' 2.2" (1.885 m)   Wt 227 lb 6.4 oz (103.1 kg)   SpO2 97%   BMI 29.04 kg/m    Subjective:    Patient ID: Todd Craig, male    DOB: 1960-06-12, 63 y.o.   MRN: [id number]  HPI: Todd Craig is a 63 y.o. male  Chief Complaint  Patient presents with   Hyperlipidemia   Hypertension   Pain   HYPERTENSION / HYPERLIPIDEMIA Continues on Amlodipine, Losartan, and Rosuvastatin. Satisfied with current treatment? yes Duration of hypertension: chronic BP monitoring frequency: not checking BP range:  BP medication side effects: no Duration of hyperlipidemia: chronic Cholesterol medication side effects: no Cholesterol supplements: none Medication compliance: good compliance Aspirin: no Recent stressors: no Recurrent headaches: no Visual changes: no Palpitations: no Dyspnea: no Chest pain: no Lower extremity edema: no Dizzy/lightheaded: no   CHRONIC KIDNEY DISEASE CKD status: stable Medications renally dose: yes Previous renal evaluation: yes Pneumovax:  refuses Influenza Vaccine:  refuses   CHRONIC PAIN  Was following with pain clinic -- was kicked out of there.  Currently living with friend, who reports she is monitoring him.  Oxycodone Ir 10 MG was his pain management -- last fill 06/01/23 for #15 tablets. Present dose:  Morphine equivalents Pain control status: exacerbated Duration: years Location: back pain Quality: dull, aching, and throbbing Current Pain Level: 6/10 Previous Pain Level: 10/10 Breakthrough pain: no Benefit from narcotic medications: yes What Activities task can be accomplished with current medication? Daily ADLs Interested in weaning off narcotics:no   Stool softners/OTC fiber: yes  Previous pain specialty evaluation: yes Non-narcotic analgesic meds: yes Narcotic contract: with pain management  Relevant past medical, surgical, family and social  history reviewed and updated as indicated. Interim medical history since our last visit reviewed. Allergies and medications reviewed and updated.  Review of Systems  Constitutional:  Negative for activity change, diaphoresis, fatigue and fever.  Respiratory:  Negative for cough, chest tightness, shortness of breath and wheezing.   Cardiovascular:  Negative for chest pain, palpitations and leg swelling.  Gastrointestinal: Negative.   Musculoskeletal:  Positive for back pain.  Neurological: Negative.   Psychiatric/Behavioral: Negative.     Per HPI unless specifically indicated above     Objective:    BP 136/82 (BP Location: Left Arm, Cuff Size: Normal)   Pulse 72   Temp 98.4 F (36.9 C) (Oral)   Ht 6' 2.2" (1.885 m)   Wt 227 lb 6.4 oz (103.1 kg)   SpO2 97%   BMI 29.04 kg/m   Wt Readings from Last 3 Encounters:  06/10/23 227 lb 6.4 oz (103.1 kg)  05/07/23 226 lb (102.5 kg)  02/19/23 217 lb 6.4 oz (98.6 kg)    Physical Exam Vitals and nursing note reviewed.  Constitutional:      General: He is awake. He is not in acute distress.    Appearance: He is well-developed and well-groomed. He is not ill-appearing or toxic-appearing.  HENT:     Head: Normocephalic.     Right Ear: Hearing and external ear normal.     Left Ear: Hearing and external ear normal.  Eyes:     General: Lids are normal.     Extraocular Movements: Extraocular movements intact.     Conjunctiva/sclera: Conjunctivae normal.  Neck:     Thyroid: No thyromegaly.     Vascular: No  carotid bruit.  Cardiovascular:     Rate and Rhythm: Normal rate and regular rhythm.     Heart sounds: Normal heart sounds. No murmur heard.    No gallop.  Pulmonary:     Effort: No accessory muscle usage or respiratory distress.     Breath sounds: Normal breath sounds.  Abdominal:     General: Bowel sounds are normal. There is no distension.     Palpations: Abdomen is soft.     Tenderness: There is no abdominal tenderness.   Musculoskeletal:     Cervical back: Full passive range of motion without pain.     Right lower leg: No edema.     Left lower leg: No edema.  Lymphadenopathy:     Cervical: No cervical adenopathy.  Skin:    General: Skin is warm.     Capillary Refill: Capillary refill takes less than 2 seconds.  Neurological:     Mental Status: He is alert and oriented to person, place, and time.     Deep Tendon Reflexes: Reflexes are normal and symmetric.     Reflex Scores:      Brachioradialis reflexes are 2+ on the right side and 2+ on the left side.      Patellar reflexes are 2+ on the right side and 2+ on the left side. Psychiatric:        Attention and Perception: Attention normal.        Mood and Affect: Mood normal.        Speech: Speech normal.        Behavior: Behavior normal. Behavior is cooperative.        Thought Content: Thought content normal.     Results for orders placed or performed in visit on 02/19/23  Microalbumin, Urine Waived   Collection Time: 02/19/23  1:39 PM  Result Value Ref Range   Microalb, Ur Waived 150 (H) 0 - 19 mg/L   Creatinine, Urine Waived 300 10 - 300 mg/dL   Microalb/Creat Ratio 30-300 (H) <30 mg/g  CBC with Differential/Platelet   Collection Time: 02/19/23  1:46 PM  Result Value Ref Range   WBC 9.6 3.4 - 10.8 x10E3/uL   RBC 4.65 4.14 - 5.80 x10E6/uL   Hemoglobin 15.3 13.0 - 17.7 g/dL   Hematocrit 78.2 95.6 - 51.0 %   MCV 100 (H) 79 - 97 fL   MCH 32.9 26.6 - 33.0 pg   MCHC 33.0 31.5 - 35.7 g/dL   RDW 21.3 08.6 - 57.8 %   Platelets 233 150 - 450 x10E3/uL   Neutrophils 73 Not Estab. %   Lymphs 19 Not Estab. %   Monocytes 5 Not Estab. %   Eos 2 Not Estab. %   Basos 1 Not Estab. %   Neutrophils Absolute 7.0 1.4 - 7.0 x10E3/uL   Lymphocytes Absolute 1.9 0.7 - 3.1 x10E3/uL   Monocytes Absolute 0.5 0.1 - 0.9 x10E3/uL   EOS (ABSOLUTE) 0.2 0.0 - 0.4 x10E3/uL   Basophils Absolute 0.1 0.0 - 0.2 x10E3/uL   Immature Granulocytes 0 Not Estab. %    Immature Grans (Abs) 0.0 0.0 - 0.1 x10E3/uL  Comprehensive metabolic panel   Collection Time: 02/19/23  1:46 PM  Result Value Ref Range   Glucose 75 70 - 99 mg/dL   BUN 11 8 - 27 mg/dL   Creatinine, Ser 4.69 (H) 0.76 - 1.27 mg/dL   eGFR 63 >62 XB/MWU/1.32   BUN/Creatinine Ratio 9 (L) 10 - 24   Sodium 141  134 - 144 mmol/L   Potassium 4.4 3.5 - 5.2 mmol/L   Chloride 104 96 - 106 mmol/L   CO2 24 20 - 29 mmol/L   Calcium 11.0 (H) 8.6 - 10.2 mg/dL   Total Protein 7.3 6.0 - 8.5 g/dL   Albumin 4.2 3.9 - 4.9 g/dL   Globulin, Total 3.1 1.5 - 4.5 g/dL   Bilirubin Total 0.5 0.0 - 1.2 mg/dL   Alkaline Phosphatase 115 44 - 121 IU/L   AST 16 0 - 40 IU/L   ALT 13 0 - 44 IU/L  Lipid Panel w/o Chol/HDL Ratio   Collection Time: 02/19/23  1:46 PM  Result Value Ref Range   Cholesterol, Total 141 100 - 199 mg/dL   Triglycerides 562 0 - 149 mg/dL   HDL 39 (L) >13 mg/dL   VLDL Cholesterol Cal 24 5 - 40 mg/dL   LDL Chol Calc (NIH) 78 0 - 99 mg/dL  TSH   Collection Time: 02/19/23  1:46 PM  Result Value Ref Range   TSH 0.534 0.450 - 4.500 uIU/mL  VITAMIN D 25 Hydroxy (Vit-D Deficiency, Fractures)   Collection Time: 02/19/23  1:46 PM  Result Value Ref Range   Vit D, 25-Hydroxy 14.6 (L) 30.0 - 100.0 ng/mL  HgB A1c   Collection Time: 02/19/23  1:46 PM  Result Value Ref Range   Hgb A1c MFr Bld 4.9 4.8 - 5.6 %   Est. average glucose Bld gHb Est-mCnc 94 mg/dL  PTH, intact and calcium   Collection Time: 02/19/23  1:46 PM  Result Value Ref Range   PTH 61 15 - 65 pg/mL   PTH Interp Comment       Assessment & Plan:   Problem List Items Addressed This Visit       Cardiovascular and Mediastinum   Primary hypertension   Chronic, ongoing.  BP elevated today. Will stop Losartan and start Losartan-HCTZ 100-12.5 MG, educated his friend and him on this.  Continues Amlodipine 10 MG.  Recommend he monitor BP at least a few mornings a week at home and document.  DASH diet at home.  Continue current  medication regimen and adjust as needed.  Labs next visit.  Return as scheduled in May, will obtain labs then.       Relevant Medications   losartan-hydrochlorothiazide (HYZAAR) 100-12.5 MG tablet   amLODipine (NORVASC) 10 MG tablet   rosuvastatin (CRESTOR) 20 MG tablet     Nervous and Auditory   Chronic midline low back pain with bilateral sciatica   Chronic, ongoing. Was followed by [facility], but can no longer attend there. Referral placed for new pain clinic to takeover his Oxycodone Ir.  Will send in 30 day supply at present to bridge him until pain clinic visit, reduce to Q6H PRN however.  Pain medication offers benefit to his daily life and ADLs.  Follows with neurosurgery at present, continue this collaboration, recent notes reviewed.      Relevant Medications   oxyCODONE (ROXICODONE) 15 MG immediate release tablet   Other Relevant Orders   Ambulatory referral to Pain Clinic     Genitourinary   Stage 3a chronic kidney disease (HCC) - Primary   Chronic, ongoing. Recommend he return to nephrology for follow-up.  Labs next visit.  Current kidney failure risk at 5 years is 1.21% on calculation.  May benefit SGLT2 in future.        Other   Hypercholesteremia   Ongoing, chronic.  Continue current medication regimen  and adjust as needed. Lipid panel next visit.      Relevant Medications   losartan-hydrochlorothiazide (HYZAAR) 100-12.5 MG tablet   amLODipine (NORVASC) 10 MG tablet   rosuvastatin (CRESTOR) 20 MG tablet     Follow up plan: Return for as scheduled May 5th.

## 2023-06-10 NOTE — Assessment & Plan Note (Signed)
 Ongoing, chronic.  Continue current medication regimen and adjust as needed. Lipid panel next visit.

## 2023-06-10 NOTE — Assessment & Plan Note (Addendum)
 Chronic, ongoing. Recommend he return to nephrology for follow-up.  Labs next visit.  Current kidney failure risk at 5 years is 1.21% on calculation.  May benefit SGLT2 in future.

## 2023-06-10 NOTE — Assessment & Plan Note (Signed)
 Chronic, ongoing.  BP elevated today. Will stop Losartan and start Losartan-HCTZ 100-12.5 MG, educated his friend and him on this.  Continues Amlodipine 10 MG.  Recommend he monitor BP at least a few mornings a week at home and document.  DASH diet at home.  Continue current medication regimen and adjust as needed.  Labs next visit.  Return as scheduled in May, will obtain labs then.

## 2023-06-10 NOTE — Telephone Encounter (Signed)
 Called [facility] and confirmed that all 4 medications were received for the patient. States they are finishing up filling the 4th one now. Called and notified patient's fiance about the prescriptions.   Copied from CRM [id number]. Topic: Clinical - Medication Question >> Jun 10, 2023  2:41 PM Todd Craig wrote: Reason for CRM:  Todd Craig (fiance) is calling to report that only 3 medications were sent to the pharmacy. Pt discussed with [name] during office visit for pain medication to be sent for 30 days. Pharmacy  [facility] [address]), [place] - [address] [place]) [place] [address] Phone: [phone]  Fax: [phone]  Please advise

## 2023-06-27 DIAGNOSIS — M545 Low back pain, unspecified: Secondary | ICD-10-CM | POA: Diagnosis not present

## 2023-06-27 DIAGNOSIS — G8929 Other chronic pain: Secondary | ICD-10-CM | POA: Diagnosis not present

## 2023-06-27 DIAGNOSIS — M5416 Radiculopathy, lumbar region: Secondary | ICD-10-CM | POA: Diagnosis not present

## 2023-07-05 NOTE — Progress Notes (Signed)
 Referring Physician:  Windell Norfolk, MD [address] [place],  [place] [address]  Primary Physician:  Marjie Skiff, NP  History of Present Illness: 07/09/2023 [name] has a history of HTN, stage 3a CKD, h/o stroke, hypercholesterolemia, gout, h/o prostate CA.   He has chronic constant back and right leg pain in anterior and posterior thigh. Occasionally has pain below knee. He has intermittent left leg pain in the thigh. LBP = leg pain. Right leg pain > left leg pain. Pain is worse with sitting and standing. He has weakness in his leg. No numbness or tingling.   He is on oxycodone 15mg  from his PCP and is trying to get in with a new pain clinic. Was seeing [facility] previously.    He is seeing [facility]- saw them for initial visit and has follow up on 07/25/23. They did not prescribe any medication per patient.    He saw [facility] back in 2024 Noralyn Pick) and he discussed left SI joint fusion with patient. He declined surgery.   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  He does not drink alcohol daily. Does this only occasionally.   Bowel/Bladder Dysfunction: none  Conservative measures:  Physical therapy: has not participated in PT Multimodal medical therapy including regular antiinflammatories: Oxycodone, prednisone, Tizanidine, Tramadol, meloxicam, Ibuprofen, Hydrocodone Injections:  Left SI joint injection at [facility] in 2024 that only helped for about 3 days  Past Surgery: no spinal surgeries   Jvion Turgeon has no symptoms of cervical myelopathy.  The symptoms are causing a significant impact on the patient's life.   Review of Systems:  A 10 point review of systems is negative, except for the pertinent positives and negatives detailed in the HPI.  Past Medical History: Past Medical History:  Diagnosis Date   Deaf, right    GERD (gastroesophageal reflux disease)    Hypertension    Hypertensive crisis, unspecified 01/11/2020   Stroke (HCC)  2014    Past Surgical History: Past Surgical History:  Procedure Laterality Date   PROSTATE BIOPSY N/A 08/01/2021   Procedure: PROSTATE BIOPSY;  Surgeon: Riki Altes, MD;  Location: [facility] ORS;  Service: Urology;  Laterality: N/A;   PROSTATE SURGERY     august /2023, for prostate cancer   right hand repair  1985   trauma with saw laceration    TRANSRECTAL ULTRASOUND N/A 08/01/2021   Procedure: TRANSRECTAL ULTRASOUND;  Surgeon: Riki Altes, MD;  Location: [facility] ORS;  Service: Urology;  Laterality: N/A;    Allergies: Allergies as of 07/09/2023 - Review Complete 07/09/2023  Allergen Reaction Noted   Tramadol  02/16/2020    Medications: Outpatient Encounter Medications as of 07/09/2023  Medication Sig   amLODipine (NORVASC) 10 MG tablet Take 1 tablet (10 mg total) by mouth daily.   losartan-hydrochlorothiazide (HYZAAR) 100-12.5 MG tablet Take 1 tablet by mouth daily.   naloxone (NARCAN) nasal spray 4 mg/0.1 mL Place 1 spray into the nose once.   oxyCODONE (ROXICODONE) 15 MG immediate release tablet Take 1 tablet (15 mg total) by mouth every 6 (six) hours as needed. Take only as needed, this is to bridge you until new pain clinic visit.   rosuvastatin (CRESTOR) 20 MG tablet Take 1 tablet (20 mg total) by mouth daily.   No facility-administered encounter medications on file as of 07/09/2023.    Social History: Social History   Tobacco Use   Smoking status: Every Day    Current packs/day:  0.50    Types: Cigarettes   Smokeless tobacco: Never   Tobacco comments:    Pt states he Smokes 1/2 pack a day   Vaping Use   Vaping status: Never Used  Substance Use Topics   Alcohol use: Yes    Comment: on occasion   Drug use: No    Family Medical History: No family history on file.  Physical Examination: Vitals:   07/09/23 1403 07/09/23 1450  BP: (!) 142/88 (!) 140/78    General: Patient is well developed, well nourished, calm, collected, and in no apparent distress.  Attention to examination is appropriate.  Respiratory: Patient is breathing without any difficulty.   NEUROLOGICAL:     Awake, alert, oriented to person, place, and time.  Speech is clear and fluent. Fund of knowledge is appropriate.   Cranial Nerves: Pupils equal round and reactive to light.  Facial tone is symmetric.    No posterior lumbar tenderness.   No abnormal lesions on exposed skin.   Strength: Side Biceps Triceps Deltoid Interossei Grip Wrist Ext. Wrist Flex.  R 5 5 5 5 5 5 5   L 5 5 5 5 5 5 5    Side Iliopsoas Quads Hamstring PF DF EHL  R 5 5 5 5 5 5   L 5 5 5 5 5 5    Reflexes are 2+ and symmetric at the biceps, brachioradialis, patella and achilles.   Hoffman's is absent.  Clonus is not present.   Bilateral upper and lower extremity sensation is intact to light touch.     No pain with IR/ER of both hips.   Slight limp when he walks favoring right leg.   Medical Decision Making  Imaging: MRI of lumbar spine dated 05/28/23:  FINDINGS:    On sagittal views the vertebral bodies have normal height and alignment.  Degenerative spondylosis and disc bulging from L2-3 down to L5-S1.  Degenerative disc disease with fatty marrow endplate degeneration at L3-4 and L4-5.  Loss of intervertebral disc space height at L3-4, L4-5 and L5-S1.  Anterior bone bridging at L3-4, L4-5 and L5-S1.  The conus medullaris terminates at the level of L1.     On axial views:  T12-L1: No spinal stenosis or foraminal narrowing L1-2: No spinal stenosis or foraminal narrowing L2-3: Disc bulging facet hypertrophy with mild bilateral foraminal stenosis L3-4: Disc bulging and facet hypertrophy with mild right and moderate to severe left foraminal stenosis L4-5: Osteophytic projection and facet hypertrophy with mild right and moderate left foraminal stenosis L5-S1: Facet hypertrophy with moderate left foraminal stenosis   Limited views of the aorta, kidneys, iliopsoas muscles and sacroiliac joints are  notable for partially visualized large left renal cyst measuring at least 9 cm.      IMPRESSION:    MRI lumbar spine without contrast demonstrating: -Multilevel degenerative spondylosis, disc disease, endplate degeneration mainly at L3-4, L4-5 and L5-S1 with foraminal narrowing bilaterally as above.  -Partially visualized left renal cyst measuring at least 9 cm in diameter similar to CT abdomen from 08/10/2021.     INTERPRETING PHYSICIAN:  Suanne Marker, MD Certified in Neurology, Neurophysiology and Neuroimaging   [facility] [address] [place], [place] [address] [phone]   Lumbar xrays dated 04/19/22:  FINDINGS: Normal anatomic alignment. No evidence for acute fracture or dislocation. Marked degenerative disc disease most pronounced L4-5 and L5-S1 with severe disc space height loss and bridging anterior osteophytes. Additionally there is L3-4 degenerative disc disease. SI joints are unremarkable.  Stool throughout the colon.   IMPRESSION: Degenerative disc disease most pronounced L4-5 and L5-S1.     Electronically Signed   By: Annia Belt M.D.   On: 04/20/2022 16:32  I have personally reviewed the images and agree with the above interpretation.  Assessment and Plan: Mr. Jared  has chronic constant back and right leg pain in anterior and posterior thigh x 2+ years. Occasionally has pain below knee. He has intermittent left leg pain in the thigh. LBP = leg pain. Right leg pain > left leg pain.   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 He has mild right and moderate/severe left foraminal stenosis L3-L4, mild right/moderate left foraminal stenosis L4-L5, and moderate left foraminal stenosis L5-S1.   LBP likely due to underlying spondylosis and DDD. Right leg pain may be due to foraminal stenosis L3-L4.   Treatment options discussed with patient and following plan made:   - PT for lumbar spine. Orders  to [facility].  - Recommend lumbar injections with pain management. He is seen [facility]. Will see if they will do injections. If not, can send to Dr. Cherylann Ratel or PMR at [facility].  - Message sent to PCP regarding pain medication. She had given him a refill until he sees pain management and he is out.  - Discussed we do not prescribe narcotics long term.  - Follow up with me in 6-8 weeks and prn.   BP was slightly elevated. No symptoms of chest pain, shortness of breath, blurry vision, or headaches. He will recheck at home and call PCP if not improved. If he develops CP, SOB, blurry vision, or headaches, then he will go to ED.     I spent a total of 40 minutes in face-to-face and non-face-to-face activities related to this patient's care today including review of outside records, review of imaging, review of symptoms, physical exam, discussion of differential diagnosis, discussion of treatment options, and documentation.   Thank you for involving me in the care of this patient.   Drake Leach PA-C Dept. of Neurosurgery

## 2023-07-09 ENCOUNTER — Encounter: Payer: Self-pay | Admitting: Orthopedic Surgery

## 2023-07-09 ENCOUNTER — Ambulatory Visit: Admitting: Orthopedic Surgery

## 2023-07-09 VITALS — BP 140/78 | Ht 74.2 in | Wt 226.0 lb

## 2023-07-09 DIAGNOSIS — M47816 Spondylosis without myelopathy or radiculopathy, lumbar region: Secondary | ICD-10-CM

## 2023-07-09 DIAGNOSIS — M48061 Spinal stenosis, lumbar region without neurogenic claudication: Secondary | ICD-10-CM

## 2023-07-09 DIAGNOSIS — M5416 Radiculopathy, lumbar region: Secondary | ICD-10-CM

## 2023-07-09 DIAGNOSIS — M51362 Other intervertebral disc degeneration, lumbar region with discogenic back pain and lower extremity pain: Secondary | ICD-10-CM

## 2023-07-09 DIAGNOSIS — M4726 Other spondylosis with radiculopathy, lumbar region: Secondary | ICD-10-CM | POA: Diagnosis not present

## 2023-07-09 NOTE — Patient Instructions (Addendum)
 It was so nice to see you today. Thank you so much for coming in.    You have wear and tear in your back (arthritis and degeneration of the discs) and this is likely causing your back and leg pain.   I sent physical therapy orders to [facility] in [place]. You can call them at [phone] if you don't hear from them to schedule your visit. They are located at [employer] in [place]. They are in the [employer] gym.   I sent a referral to [facility] in [facility] for lumbar injections. You can discuss at your follow up or they should call you.   I will send a message to your PCP about your pain medication.   I will see you back in 6-8 weeks. Please do not hesitate to call if you have any questions or concerns. You can also message me in MyChart.   Your blood pressure was slightly elevated today. I want you to recheck it at home and follow up with your PCP if it remains high. If you have any chest pain, shortness of breath, blurry vision, or headaches then you need to go to ED.    Drake Leach PA-C [phone]     The physicians and staff at [facility] are committed to providing excellent care. You may receive a survey asking for feedback about your experience at our office. We value you your feedback and appreciate you taking the time to to fill it out. The [facility] leadership team is also available to discuss your experience in person, feel free to contact us [phone].

## 2023-07-19 ENCOUNTER — Telehealth: Payer: Self-pay

## 2023-07-19 DIAGNOSIS — G8929 Other chronic pain: Secondary | ICD-10-CM

## 2023-07-19 NOTE — Telephone Encounter (Signed)
 Copied from CRM [id number]. Topic: Clinical - Medication Question >> Jul 19, 2023 11:14 AM DeAngela L wrote: Reason for CRM: Patient / wife called asking if the Dr could refill a prescription for the oxycodone. the patient was looking for a pain management Dr and the office didn't accept his insurance so he could not get the pain medication refilled Patient has been out of medication since beginning of March Call back number [phone]

## 2023-07-19 NOTE — Telephone Encounter (Signed)
 Forwarding to PCP for review.

## 2023-07-22 DIAGNOSIS — M5416 Radiculopathy, lumbar region: Secondary | ICD-10-CM | POA: Diagnosis not present

## 2023-07-23 ENCOUNTER — Encounter: Payer: Self-pay | Admitting: Nurse Practitioner

## 2023-07-23 NOTE — Telephone Encounter (Signed)
 Request for continuation of care notes from [facility] requested from pain management. Will need to be reviewed with provider when received.

## 2023-07-25 ENCOUNTER — Other Ambulatory Visit: Payer: Self-pay | Admitting: Nurse Practitioner

## 2023-07-25 DIAGNOSIS — M545 Low back pain, unspecified: Secondary | ICD-10-CM | POA: Diagnosis not present

## 2023-07-25 DIAGNOSIS — M47816 Spondylosis without myelopathy or radiculopathy, lumbar region: Secondary | ICD-10-CM | POA: Diagnosis not present

## 2023-07-25 DIAGNOSIS — G8929 Other chronic pain: Secondary | ICD-10-CM | POA: Diagnosis not present

## 2023-07-25 NOTE — Telephone Encounter (Signed)
 Copied from CRM [id number]. Topic: Clinical - Medication Refill >> Jul 25, 2023 11:59 AM Patsy Lager T wrote: Most Recent Primary Care Visit:  Provider: Aura Dials T  Department: [facility]  Visit Type: OFFICE VISIT  Date: 06/10/2023  Medication: oxyCODONE (ROXICODONE) 15 MG immediate release tablet   Has the patient contacted their pharmacy? Yes  Is this the correct pharmacy for this prescription? Yes If no, delete pharmacy and type the correct one.  This is the patient's preferred pharmacy:  [facility] [address]), [place] - [address] [place]) [place] [address] Phone: [phone] Fax: [phone]   Has the prescription been filled recently? No  Is the patient out of the medication? Yes  Has the patient been seen for an appointment in the last year OR does the patient have an upcoming appointment? Yes  Can we respond through MyChart? No  Agent: Please be advised that Rx refills may take up to 3 business days. We ask that you follow-up with your pharmacy.  Medication has been discontinued by provider but patient is requesting a refill

## 2023-07-26 MED ORDER — OXYCODONE HCL 15 MG PO TABS: 15.0000 mg | ORAL_TABLET | Freq: Four times a day (QID) | ORAL | 0 refills | Status: DC | PRN

## 2023-07-26 NOTE — Telephone Encounter (Signed)
 Requested medication (s) are due for refill today - no  Requested medication (s) are on the active medication list -no  Future visit scheduled -yes  Last refill: unsure  Notes to clinic: non delegated Rx, no longer listed on current medication list   Requested Prescriptions  Pending Prescriptions Disp Refills   oxyCODONE (ROXICODONE) 15 MG immediate release tablet 30 tablet 0    Sig: Take 1 tablet (15 mg total) by mouth every 4 (four) hours as needed for pain.     Not Delegated - Analgesics:  Opioid Agonists Failed - 07/26/2023  9:05 AM      Failed - This refill cannot be delegated      Failed - Urine Drug Screen completed in last 360 days      Passed - Valid encounter within last 3 months    Recent Outpatient Visits           1 month ago Stage 3a chronic kidney disease (HCC)   [place] [facility] [place], Corrie Dandy T, NP       Future Appointments             In 3 weeks Cannady, Dorie Rank, NP [place] [facility]               Requested Prescriptions  Pending Prescriptions Disp Refills   oxyCODONE (ROXICODONE) 15 MG immediate release tablet 30 tablet 0    Sig: Take 1 tablet (15 mg total) by mouth every 4 (four) hours as needed for pain.     Not Delegated - Analgesics:  Opioid Agonists Failed - 07/26/2023  9:05 AM      Failed - This refill cannot be delegated      Failed - Urine Drug Screen completed in last 360 days      Passed - Valid encounter within last 3 months    Recent Outpatient Visits           1 month ago Stage 3a chronic kidney disease (HCC)   [place] [facility] [place], Dorie Rank, NP       Future Appointments             In 3 weeks Cannady, Dorie Rank, NP [facility]

## 2023-07-26 NOTE — Addendum Note (Signed)
 Addended by: Aura Dials T on: 07/26/2023 04:59 PM   Modules accepted: Orders

## 2023-07-31 ENCOUNTER — Ambulatory Visit: Admitting: Nurse Practitioner

## 2023-07-31 ENCOUNTER — Encounter: Payer: Self-pay | Admitting: Nurse Practitioner

## 2023-07-31 VITALS — BP 138/84 | HR 81 | Temp 98.1°F | Ht 74.0 in | Wt 228.6 lb

## 2023-07-31 DIAGNOSIS — G8929 Other chronic pain: Secondary | ICD-10-CM | POA: Diagnosis not present

## 2023-07-31 DIAGNOSIS — M5442 Lumbago with sciatica, left side: Secondary | ICD-10-CM

## 2023-07-31 DIAGNOSIS — M5441 Lumbago with sciatica, right side: Secondary | ICD-10-CM | POA: Diagnosis not present

## 2023-07-31 MED ORDER — OXYCODONE HCL 15 MG PO TABS: 15.0000 mg | ORAL_TABLET | Freq: Four times a day (QID) | ORAL | 0 refills | Status: DC | PRN

## 2023-07-31 NOTE — Patient Instructions (Signed)
Chronic Pain, Adult Chronic pain is a type of pain that lasts or keeps coming back for at least 3-6 months. You may have headaches, pain in the abdomen, or pain in other areas of the body. Chronic pain may be related to an illness, injury, or a health condition. Sometimes, the cause of chronic pain is not known. Chronic pain can make it hard for you to do daily activities. If it is not treated, chronic pain can lead to anxiety and depression. Treatment depends on the cause of your pain and how severe it is. You may need to work with a pain specialist to come up with a treatment plan. Many people benefit from two or more types of treatment to control their pain. Follow these instructions at home: Treatment plan Follow your treatment plan as told by your health care provider. This may include: Gentle, regular exercise. Eating a healthy diet that includes foods such as vegetables, fruits, fish, and lean meats. Mental health therapy (cognitive or behavioral therapy) that changes the way you think or act in response to the pain. This may help improve how you feel. Doing physical therapy exercises to improve movement and strength. Meditation, yoga, acupuncture, or massage therapy. Using the oils from plants in your environment or on your skin (aromatherapy). Other treatments may include: Over-the-counter or prescription medicines. Color, light, or sound therapy. Local electrical stimulation. The electrical pulses help to relieve pain by temporarily stopping the nerve impulses that cause you to feel pain. Injections. These deliver numbing or pain-relieving medicines into the spine or the area of pain.  Medicines Take over-the-counter and prescription medicines only as told by your health care provider. Ask your health care provider if the medicine prescribed to you: Requires you to avoid driving or using machinery. Can cause constipation. You may need to take these actions to prevent or treat  constipation: Drink enough fluid to keep your urine pale yellow. Take over-the-counter or prescription medicines. Eat foods that are high in fiber, such as beans, whole grains, and fresh fruits and vegetables. Limit foods that are high in fat and processed sugars, such as fried or sweet foods. Lifestyle  Ask your health care provider whether you should keep a pain diary. Your health care provider will tell you what information to write in the diary. This may include: When you have pain. What the pain feels like. How medicines and other behaviors or treatments help to reduce the pain. Consider talking with a mental health care provider about how to help manage chronic pain. Consider joining a chronic pain support group. Try to control or lower your stress levels. Talk with your health care provider about ways to do this. General instructions Learn as much as you can about how to manage your chronic pain. Ask your health care provider if an intensive pain rehabilitation program or a chronic pain specialist would be helpful. Check your pain level as told by your health care provider. Ask your health care provider if you should use a pain scale. Contact a health care provider if: Your pain is not controlled with treatment. You have new pain. You have side effects from pain medicine. You feel weak or you have trouble doing your normal activities. You have trouble sleeping or you develop confusion. You lose feeling or have numbness in your body. You lose control of your bowels or bladder. Get help right away if: Your pain suddenly gets much worse. You develop chest pain. You have trouble breathing or shortness of  breath. You faint, or another person sees you faint. These symptoms may be an emergency. Get help right away. Call 911. Do not wait to see if the symptoms will go away. Do not drive yourself to the hospital. Also, get help right away if: You have thoughts about hurting yourself  or others. Take one of these steps if you feel like you may hurt yourself or others, or have thoughts about taking your own life: Go to your nearest emergency room. Call 911. Call the National Suicide Prevention Lifeline at 613-055-8912 or 988. This is open 24 hours a day. Text the Crisis Text Line at 323-193-1954. This information is not intended to replace advice given to you by your health care provider. Make sure you discuss any questions you have with your health care provider. Document Revised: 11/22/2021 Document Reviewed: 10/25/2021 Elsevier Patient Education  2024 [employer].

## 2023-07-31 NOTE — Progress Notes (Signed)
 BP 138/84 (BP Location: Left Arm, Patient Position: Sitting)   Pulse 81   Temp 98.1 F (36.7 C) (Oral)   Ht 6\' 2"  (1.88 m)   Wt 228 lb 9.6 oz (103.7 kg)   SpO2 100%   BMI 29.35 kg/m    Subjective:    Patient ID: Todd Craig, male    DOB: 13-Apr-1961, 63 y.o.   MRN: [id number]  HPI: Todd Craig is a 63 y.o. male  Chief Complaint  Patient presents with   Pain    Patient's girlfriends states they went to [facility] yesterday for an appointment. States that they sat there for an hour in the waiting room before they were informed that he could not be seen.    CHRONIC PAIN  Has had chronic back pain ongoing for years since prostate cancer and removal.  Was following with pain clinic. Was kicked out of there per patient, [facility] in [place] due to drug screen issues.  Currently living with friend, who reports she is monitoring him.  Oxycodone Ir 10 MG Q6H is his pain management -- last fill 06/12/23 for 30 day supply. Has been doing PT.  Last saw neurosurgery on 07/09/23. [facility] following currently and plan injections. He currently lives with friend who monitors medications and ensures he takes as instructed + does not do anything else. Present dose: as above Morphine equivalents: 90 Pain control status: exacerbated Duration: years Location: back pain Quality: dull, aching, and throbbing Current Pain Level: 6/10 Previous Pain Level: 10/10 Breakthrough pain: no Benefit from narcotic medications: yes What Activities task can be accomplished with current medication? Daily ADLs Interested in weaning off narcotics:no   Stool softners/OTC fiber: yes  Previous pain specialty evaluation: yes Non-narcotic analgesic meds: yes Narcotic contract: with pain management  Relevant past medical, surgical, family and social history reviewed and updated as indicated. Interim medical history since our last visit reviewed. Allergies and medications reviewed and updated.  Review of Systems   Constitutional:  Negative for activity change, diaphoresis, fatigue and fever.  Respiratory:  Negative for cough, chest tightness, shortness of breath and wheezing.   Cardiovascular:  Negative for chest pain, palpitations and leg swelling.  Gastrointestinal: Negative.   Musculoskeletal:  Positive for back pain.  Neurological: Negative.   Psychiatric/Behavioral: Negative.     Per HPI unless specifically indicated above     Objective:    BP 138/84 (BP Location: Left Arm, Patient Position: Sitting)   Pulse 81   Temp 98.1 F (36.7 C) (Oral)   Ht 6\' 2"  (1.88 m)   Wt 228 lb 9.6 oz (103.7 kg)   SpO2 100%   BMI 29.35 kg/m   Wt Readings from Last 3 Encounters:  07/31/23 228 lb 9.6 oz (103.7 kg)  07/09/23 226 lb (102.5 kg)  06/10/23 227 lb 6.4 oz (103.1 kg)    Physical Exam Vitals and nursing note reviewed.  Constitutional:      General: He is awake. He is not in acute distress.    Appearance: He is well-developed and well-groomed. He is not ill-appearing or toxic-appearing.  HENT:     Head: Normocephalic.     Right Ear: Hearing and external ear normal.     Left Ear: Hearing and external ear normal.  Eyes:     General: Lids are normal.     Extraocular Movements: Extraocular movements intact.     Conjunctiva/sclera: Conjunctivae normal.  Neck:     Thyroid: No thyromegaly.     Vascular:  No carotid bruit.  Cardiovascular:     Rate and Rhythm: Normal rate and regular rhythm.     Heart sounds: Normal heart sounds. No murmur heard.    No gallop.  Pulmonary:     Effort: No accessory muscle usage or respiratory distress.     Breath sounds: Normal breath sounds.  Abdominal:     General: Bowel sounds are normal. There is no distension.     Palpations: Abdomen is soft.     Tenderness: There is no abdominal tenderness.  Musculoskeletal:     Cervical back: Full passive range of motion without pain.     Lumbar back: Tenderness present. No swelling, edema or bony tenderness.  Decreased range of motion.     Right lower leg: No edema.     Left lower leg: No edema.     Comments: Antalgic gait.  Lymphadenopathy:     Cervical: No cervical adenopathy.  Skin:    General: Skin is warm.     Capillary Refill: Capillary refill takes less than 2 seconds.  Neurological:     Mental Status: He is alert and oriented to person, place, and time.     Deep Tendon Reflexes: Reflexes are normal and symmetric.     Reflex Scores:      Brachioradialis reflexes are 2+ on the right side and 2+ on the left side.      Patellar reflexes are 2+ on the right side and 2+ on the left side. Psychiatric:        Attention and Perception: Attention normal.        Mood and Affect: Mood normal.        Speech: Speech normal.        Behavior: Behavior normal. Behavior is cooperative.        Thought Content: Thought content normal.    Results for orders placed or performed in visit on 02/19/23  Microalbumin, Urine Waived   Collection Time: 02/19/23  1:39 PM  Result Value Ref Range   Microalb, Ur Waived 150 (H) 0 - 19 mg/L   Creatinine, Urine Waived 300 10 - 300 mg/dL   Microalb/Creat Ratio 30-300 (H) <30 mg/g  CBC with Differential/Platelet   Collection Time: 02/19/23  1:46 PM  Result Value Ref Range   WBC 9.6 3.4 - 10.8 x10E3/uL   RBC 4.65 4.14 - 5.80 x10E6/uL   Hemoglobin 15.3 13.0 - 17.7 g/dL   Hematocrit 19.1 47.8 - 51.0 %   MCV 100 (H) 79 - 97 fL   MCH 32.9 26.6 - 33.0 pg   MCHC 33.0 31.5 - 35.7 g/dL   RDW 29.5 62.1 - 30.8 %   Platelets 233 150 - 450 x10E3/uL   Neutrophils 73 Not Estab. %   Lymphs 19 Not Estab. %   Monocytes 5 Not Estab. %   Eos 2 Not Estab. %   Basos 1 Not Estab. %   Neutrophils Absolute 7.0 1.4 - 7.0 x10E3/uL   Lymphocytes Absolute 1.9 0.7 - 3.1 x10E3/uL   Monocytes Absolute 0.5 0.1 - 0.9 x10E3/uL   EOS (ABSOLUTE) 0.2 0.0 - 0.4 x10E3/uL   Basophils Absolute 0.1 0.0 - 0.2 x10E3/uL   Immature Granulocytes 0 Not Estab. %   Immature Grans (Abs) 0.0 0.0 - 0.1  x10E3/uL  Comprehensive metabolic panel   Collection Time: 02/19/23  1:46 PM  Result Value Ref Range   Glucose 75 70 - 99 mg/dL   BUN 11 8 - 27 mg/dL   Creatinine, Ser  1.29 (H) 0.76 - 1.27 mg/dL   eGFR 63 >16 SA/YTK/1.60   BUN/Creatinine Ratio 9 (L) 10 - 24   Sodium 141 134 - 144 mmol/L   Potassium 4.4 3.5 - 5.2 mmol/L   Chloride 104 96 - 106 mmol/L   CO2 24 20 - 29 mmol/L   Calcium 11.0 (H) 8.6 - 10.2 mg/dL   Total Protein 7.3 6.0 - 8.5 g/dL   Albumin 4.2 3.9 - 4.9 g/dL   Globulin, Total 3.1 1.5 - 4.5 g/dL   Bilirubin Total 0.5 0.0 - 1.2 mg/dL   Alkaline Phosphatase 115 44 - 121 IU/L   AST 16 0 - 40 IU/L   ALT 13 0 - 44 IU/L  Lipid Panel w/o Chol/HDL Ratio   Collection Time: 02/19/23  1:46 PM  Result Value Ref Range   Cholesterol, Total 141 100 - 199 mg/dL   Triglycerides 109 0 - 149 mg/dL   HDL 39 (L) >32 mg/dL   VLDL Cholesterol Cal 24 5 - 40 mg/dL   LDL Chol Calc (NIH) 78 0 - 99 mg/dL  TSH   Collection Time: 02/19/23  1:46 PM  Result Value Ref Range   TSH 0.534 0.450 - 4.500 uIU/mL  VITAMIN D 25 Hydroxy (Vit-D Deficiency, Fractures)   Collection Time: 02/19/23  1:46 PM  Result Value Ref Range   Vit D, 25-Hydroxy 14.6 (L) 30.0 - 100.0 ng/mL  HgB A1c   Collection Time: 02/19/23  1:46 PM  Result Value Ref Range   Hgb A1c MFr Bld 4.9 4.8 - 5.6 %   Est. average glucose Bld gHb Est-mCnc 94 mg/dL  PTH, intact and calcium   Collection Time: 02/19/23  1:46 PM  Result Value Ref Range   PTH 61 15 - 65 pg/mL   PTH Interp Comment       Assessment & Plan:   Problem List Items Addressed This Visit       Nervous and Auditory   Chronic midline low back pain with bilateral sciatica - Primary   Chronic, ongoing. Was followed by [facility], but can no longer attend there. Referral placed for new pain clinic to takeover his Oxycodone Ir.  Will send in 30 day supply at present to bridge him until pain clinic visit, maintain at [facility] as ordered which was a reduction by PCP  on last script.  Pain medication offers benefit to his daily life and ADLs.  Follows with neurosurgery at present, continue this collaboration, recent notes reviewed.      Relevant Medications   oxyCODONE (ROXICODONE) 15 MG immediate release tablet      Follow up plan: Return for as scheduled on May 5th.

## 2023-07-31 NOTE — Assessment & Plan Note (Signed)
 Chronic, ongoing. Was followed by [facility], but can no longer attend there. Referral placed for new pain clinic to takeover his Oxycodone Ir.  Will send in 30 day supply at present to bridge him until pain clinic visit, maintain at [facility] as ordered which was a reduction by PCP on last script.  Pain medication offers benefit to his daily life and ADLs.  Follows with neurosurgery at present, continue this collaboration, recent notes reviewed.

## 2023-08-07 DIAGNOSIS — M5416 Radiculopathy, lumbar region: Secondary | ICD-10-CM | POA: Diagnosis not present

## 2023-08-09 DIAGNOSIS — M5416 Radiculopathy, lumbar region: Secondary | ICD-10-CM | POA: Diagnosis not present

## 2023-08-15 DIAGNOSIS — Z79899 Other long term (current) drug therapy: Secondary | ICD-10-CM | POA: Diagnosis not present

## 2023-08-19 ENCOUNTER — Encounter: Payer: Self-pay | Admitting: Orthopedic Surgery

## 2023-08-19 ENCOUNTER — Ambulatory Visit: Payer: Self-pay | Admitting: Nurse Practitioner

## 2023-08-19 DIAGNOSIS — M5416 Radiculopathy, lumbar region: Secondary | ICD-10-CM | POA: Diagnosis not present

## 2023-08-19 DIAGNOSIS — G8929 Other chronic pain: Secondary | ICD-10-CM

## 2023-08-19 DIAGNOSIS — F17290 Nicotine dependence, other tobacco product, uncomplicated: Secondary | ICD-10-CM

## 2023-08-19 DIAGNOSIS — F1090 Alcohol use, unspecified, uncomplicated: Secondary | ICD-10-CM

## 2023-08-19 DIAGNOSIS — I10 Essential (primary) hypertension: Secondary | ICD-10-CM

## 2023-08-19 DIAGNOSIS — E212 Other hyperparathyroidism: Secondary | ICD-10-CM

## 2023-08-19 DIAGNOSIS — E559 Vitamin D deficiency, unspecified: Secondary | ICD-10-CM

## 2023-08-19 DIAGNOSIS — N1831 Chronic kidney disease, stage 3a: Secondary | ICD-10-CM

## 2023-08-19 DIAGNOSIS — Z8546 Personal history of malignant neoplasm of prostate: Secondary | ICD-10-CM

## 2023-08-19 DIAGNOSIS — E663 Overweight: Secondary | ICD-10-CM

## 2023-08-19 DIAGNOSIS — E7800 Pure hypercholesterolemia, unspecified: Secondary | ICD-10-CM

## 2023-08-19 DIAGNOSIS — R801 Persistent proteinuria, unspecified: Secondary | ICD-10-CM

## 2023-08-19 NOTE — Progress Notes (Signed)
 PT notes scanned into chart from [facility] for his lumbar spine. He was discharged after 4 visits due to minimal improvement.

## 2023-08-29 ENCOUNTER — Telehealth: Payer: Self-pay | Admitting: Nurse Practitioner

## 2023-08-29 NOTE — Telephone Encounter (Signed)
 Copied from CRM [id number]. Topic: Clinical - Medication Prior Auth >> Aug 29, 2023  2:00 PM Chrystal Crape R wrote: Pt Girlfriend is calling for prior Auth for pt rx.  oxyCODONE  (ROXICODONE ) 15 MG immediate release tablet.  Pharmacy is telling her they have not received authorization and pt is complete out of medication.

## 2023-08-29 NOTE — Telephone Encounter (Signed)
 PA for Oxycodone  has been initiated and submitted via Cover My Meds. Key: [id number]

## 2023-08-30 NOTE — Telephone Encounter (Signed)
 PA has been approved. Called and notified patient's girlfriend of approval.

## 2023-08-30 NOTE — Progress Notes (Deleted)
 Referring Physician:  Lemar Pyles, NP [address] [place],  [place] [address]  Primary Physician:  Todd Pyles, NP  History of Present Illness: 08/30/2023 [name] has a history of HTN, stage 3a CKD, h/o stroke, hypercholesterolemia, gout, h/o prostate CA.   Last seen by me on 07/09/23 for back and leg pain. 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 He has mild right and moderate/severe left foraminal stenosis L3-L4, mild right/moderate left foraminal stenosis L4-L5, and moderate left foraminal stenosis L5-S1.   He was sent to PT and discharged on 08/19/23 after 4 visits. He was to discuss lumbar injections with [facility]. He is scheduled for bilateral lumbar MBB L4-S1.***  He is here for follow up.        He has chronic constant back and right leg pain in anterior and posterior thigh. Occasionally has pain below knee. He has intermittent left leg pain in the thigh. LBP = leg pain. Right leg pain > left leg pain. Pain is worse with sitting and standing. He has weakness in his leg. No numbness or tingling.   He is on oxycodone  15mg  from his PCP and is trying to get in with a new pain clinic. Was seeing [facility] previously.    He is seeing [facility]- saw them for initial visit and has follow up on 07/25/23. They did not prescribe any medication per patient.    He saw [facility] back in 2024 Todd Craig) and he discussed left SI joint fusion with patient. He declined surgery.   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  He does not drink alcohol  daily. Does this only occasionally.   Bowel/Bladder Dysfunction: none  Conservative measures:  Physical therapy:  discharged from [facility] on 08/19/23 after 4 visits Multimodal medical therapy including regular antiinflammatories: Oxycodone , prednisone , Tizanidine , Tramadol , meloxicam, Ibuprofen , Hydrocodone  Injections:  Left SI joint injection at [facility] in 2024 that only  helped for about 3 days  Past Surgery: no spinal surgeries   Todd Craig has no symptoms of cervical myelopathy.  The symptoms are causing a significant impact on the patient's life.   Review of Systems:  A 10 point review of systems is negative, except for the pertinent positives and negatives detailed in the HPI.  Past Medical History: Past Medical History:  Diagnosis Date   Deaf, right    GERD (gastroesophageal reflux disease)    Hypertension    Hypertensive crisis, unspecified 01/11/2020   Stroke (HCC) 2014    Past Surgical History: Past Surgical History:  Procedure Laterality Date   PROSTATE BIOPSY N/A 08/01/2021   Procedure: PROSTATE BIOPSY;  Surgeon: Geraline Knapp, MD;  Location: [facility] ORS;  Service: Urology;  Laterality: N/A;   PROSTATE SURGERY     august /2023, for prostate cancer   right hand repair  1985   trauma with saw laceration    TRANSRECTAL ULTRASOUND N/A 08/01/2021   Procedure: TRANSRECTAL ULTRASOUND;  Surgeon: Geraline Knapp, MD;  Location: [facility] ORS;  Service: Urology;  Laterality: N/A;    Allergies: Allergies as of 09/03/2023 - Review Complete 07/31/2023  Allergen Reaction Noted   Tramadol   02/16/2020    Medications: Outpatient Encounter Medications as of 09/03/2023  Medication Sig   amLODipine  (NORVASC ) 10 MG tablet Take 1 tablet (10 mg total) by mouth daily.   losartan -hydrochlorothiazide  (HYZAAR) 100-12.5 MG tablet Take 1 tablet by mouth daily.   naloxone (NARCAN) nasal spray  4 mg/0.1 mL Place 1 spray into the nose once.   oxyCODONE  (ROXICODONE ) 15 MG immediate release tablet Take 1 tablet (15 mg total) by mouth every 6 (six) hours as needed for pain. Use sparingly and only as needed for severe pain >7/10.  Will need to see pain management provider for ongoing refills.   rosuvastatin  (CRESTOR ) 20 MG tablet Take 1 tablet (20 mg total) by mouth daily.   No facility-administered encounter medications on file as of 09/03/2023.    Social  History: Social History   Tobacco Use   Smoking status: Every Day    Current packs/day: 0.50    Types: Cigarettes   Smokeless tobacco: Never   Tobacco comments:    Pt states he Smokes 1/2 pack a day   Vaping Use   Vaping status: Never Used  Substance Use Topics   Alcohol  use: Yes    Comment: on occasion   Drug use: No    Family Medical History: No family history on file.  Physical Examination: There were no vitals filed for this visit.    Awake, alert, oriented to person, place, and time.  Speech is clear and fluent. Fund of knowledge is appropriate.   Cranial Nerves: Pupils equal round and reactive to light.  Facial tone is symmetric.    No posterior lumbar tenderness.   No abnormal lesions on exposed skin.   Strength: Side Biceps Triceps Deltoid Interossei Grip Wrist Ext. Wrist Flex.  R 5 5 5 5 5 5 5   L 5 5 5 5 5 5 5    Side Iliopsoas Quads Hamstring PF DF EHL  R 5 5 5 5 5 5   L 5 5 5 5 5 5    Reflexes are 2+ and symmetric at the biceps, brachioradialis, patella and achilles.   Hoffman's is absent.  Clonus is not present.   Bilateral upper and lower extremity sensation is intact to light touch.     No pain with IR/ER of both hips.   Slight limp when he walks favoring right leg.   Medical Decision Making  Imaging: none  Assessment and Plan: Mr. Frazzini  has chronic constant back and right leg pain in anterior and posterior thigh x 2+ years. Occasionally has pain below knee. He has intermittent left leg pain in the thigh. LBP = leg pain. Right leg pain > left leg pain.   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 He has mild right and moderate/severe left foraminal stenosis L3-L4, mild right/moderate left foraminal stenosis L4-L5, and moderate left foraminal stenosis L5-S1.   LBP likely due to underlying spondylosis and DDD. Right leg pain may be due to foraminal stenosis L3-L4.   Treatment options discussed with patient and  following plan made:   - PT for lumbar spine. [facility] to [facility].  - Recommend lumbar injections with pain management. He is seen [facility]. Will see if they will do injections. If not, can send to Dr. Rhesa Celeste or PMR at [facility].  - Message sent to PCP regarding pain medication. She had given him a refill until he sees pain management and he is out.  - Discussed we do not prescribe narcotics long term.  - Follow up with me in 6-8 weeks and prn.   BP was slightly elevated. No symptoms of chest pain, shortness of breath, blurry vision, or headaches. He will recheck at home and call PCP if not improved. If he develops CP, SOB, blurry vision, or  headaches, then he will go to ED.     I spent a total of 40 minutes in face-to-face and non-face-to-face activities related to this patient's care today including review of outside records, review of imaging, review of symptoms, physical exam, discussion of differential diagnosis, discussion of treatment options, and documentation.   Thank you for involving me in the care of this patient.   Lucetta Russel PA-C Dept. of Neurosurgery

## 2023-09-03 ENCOUNTER — Ambulatory Visit: Admitting: Orthopedic Surgery

## 2023-09-26 DIAGNOSIS — Z79899 Other long term (current) drug therapy: Secondary | ICD-10-CM | POA: Diagnosis not present

## 2023-09-26 DIAGNOSIS — I639 Cerebral infarction, unspecified: Secondary | ICD-10-CM | POA: Diagnosis not present

## 2023-09-26 DIAGNOSIS — G894 Chronic pain syndrome: Secondary | ICD-10-CM | POA: Diagnosis not present

## 2023-09-26 DIAGNOSIS — M545 Low back pain, unspecified: Secondary | ICD-10-CM | POA: Diagnosis not present

## 2023-10-23 NOTE — Patient Instructions (Signed)

## 2023-10-24 ENCOUNTER — Ambulatory Visit: Admitting: Nurse Practitioner

## 2023-10-24 ENCOUNTER — Encounter: Payer: Self-pay | Admitting: Nurse Practitioner

## 2023-10-24 VITALS — BP 135/81 | HR 69 | Temp 98.4°F | Ht 74.0 in | Wt 227.0 lb

## 2023-10-24 DIAGNOSIS — G8929 Other chronic pain: Secondary | ICD-10-CM

## 2023-10-24 DIAGNOSIS — M5441 Lumbago with sciatica, right side: Secondary | ICD-10-CM

## 2023-10-24 DIAGNOSIS — E212 Other hyperparathyroidism: Secondary | ICD-10-CM

## 2023-10-24 DIAGNOSIS — F17290 Nicotine dependence, other tobacco product, uncomplicated: Secondary | ICD-10-CM

## 2023-10-24 DIAGNOSIS — F1090 Alcohol use, unspecified, uncomplicated: Secondary | ICD-10-CM

## 2023-10-24 DIAGNOSIS — E559 Vitamin D deficiency, unspecified: Secondary | ICD-10-CM | POA: Diagnosis not present

## 2023-10-24 DIAGNOSIS — I10 Essential (primary) hypertension: Secondary | ICD-10-CM

## 2023-10-24 DIAGNOSIS — R413 Other amnesia: Secondary | ICD-10-CM | POA: Insufficient documentation

## 2023-10-24 DIAGNOSIS — Z8546 Personal history of malignant neoplasm of prostate: Secondary | ICD-10-CM | POA: Diagnosis not present

## 2023-10-24 DIAGNOSIS — M5442 Lumbago with sciatica, left side: Secondary | ICD-10-CM | POA: Diagnosis not present

## 2023-10-24 DIAGNOSIS — N1831 Chronic kidney disease, stage 3a: Secondary | ICD-10-CM | POA: Diagnosis not present

## 2023-10-24 DIAGNOSIS — E7800 Pure hypercholesterolemia, unspecified: Secondary | ICD-10-CM

## 2023-10-24 LAB — MICROALBUMIN, URINE WAIVED
Creatinine, Urine Waived: 300 mg/dL (ref 10–300)
Microalb, Ur Waived: 150 mg/L — ABNORMAL HIGH (ref 0–19)

## 2023-10-24 MED ORDER — OXYCODONE HCL 15 MG PO TABS: 15.0000 mg | ORAL_TABLET | Freq: Four times a day (QID) | ORAL | 0 refills | Status: DC | PRN

## 2023-10-24 NOTE — Assessment & Plan Note (Signed)
 Chronic, ongoing.  BP stable today. Continue Losartan -HCTZ 100-12.5 MG and Amlodipine  10 MG.  Recommend he monitor BP at least a few mornings a week at home and document.  DASH diet at home.  Continue current medication regimen and adjust as needed.  Labs: CBC, CMP, TSH, urine ALB.  Urine ALB 150 July 2025.

## 2023-10-24 NOTE — Progress Notes (Signed)
 BP 135/81   Pulse 69   Temp 98.4 F (36.9 C) (Oral)   Ht 6' 2 (1.88 m)   Wt 227 lb (103 kg)   SpO2 98%   BMI 29.15 kg/m    Subjective:    Patient ID: Todd Craig, male    DOB: Jun 26, 1960, 63 y.o.   MRN: [id number]  HPI: Todd Craig is a 63 y.o. male  Chief Complaint  Patient presents with   Memory Changes    Patient's friend states she has been noticing memory changes in the patient over the last few months. States she has been noticing he has been doing things that just do not make sense.    Pain    Patient's friend states that the patient has not been able to get his medication from the pain clinic. States they went to the pain clinic in [place] twice, urine was clean, but they still wouldn't give the patient the medication   His friend who attends all visits with him is present at bedside, Todd Craig.  MEMORY CHANGES Todd Craig reports noticing a lot of confusion and memory changes for awhile that are worsening.  Lives with Todd Craig. Is forgetting things and lots of confusion.  Long periods of time to take clothes off or get dressed.  Gets angry easily.  Has family history of dementia, mother.  Does not recall how old she was when she passed away.  No recent alcohol  or drug use.  Continues to smoke cigarettes daily. Last imaging: 08/10/2010 Neurology visits: none Current memory care medications: none Medication compliance: as above Family support: friend Living situation: as above Recent Falls: not recent Incontinent/Continent: continent Meal intake: eating meals well Behaviors: irritability Sleep pattern: sleeps a lot    10/24/2023   11:55 AM  6CIT Screen  What Year? 4 points  What month? 0 points  What time? 3 points  Count back from 20 2 points  Months in reverse 4 points  Repeat phrase 10 points  Total Score 23 points   CHRONIC KIDNEY DISEASE Has not seen nephrology since 05/08/21, have instructed at all recent visits to schedule with them. History of prostate cancer,  saw urology last 07/27/22.  Prostate removed 11/28/21. CKD status: stable Medications renally dose: yes Previous renal evaluation: yes Pneumovax:  refuses Influenza Vaccine:  refuses   CHRONIC PAIN  Went to pain clinic in [employer]. Per Caro they reported he had a clean UDS and when they went back to get pain medication was told them they could not prescribe as there were no drugs in system. Completed physical therapy in past.  They would like a referral to [facility] or alternate pain clinic.  Currently living with Todd Craig, who reports she is monitoring him.  Oxycodone  Ir 10 MG was his pain management -- last fill 08/30/23 for 14 pills. They are aware PCP does not perform chronic pain management. Present dose:  Morphine equivalents Pain control status: exacerbated Duration: years Location: back pain Quality: dull, aching, and throbbing Current Pain Level: 6/10 Previous Pain Level: 10/10 Breakthrough pain: no Benefit from narcotic medications: yes What Activities task can be accomplished with current medication? Daily ADLs Interested in weaning off narcotics:no   Stool softners/OTC fiber: yes  Previous pain specialty evaluation: yes Non-narcotic analgesic meds: yes Narcotic contract: with pain management  HYPERTENSION / HYPERLIPIDEMIA Taking Amlodipine , Losartan -HCTZ, and Crestor . Satisfied with current treatment? yes Duration of hypertension: chronic BP monitoring frequency: not checking BP range:  BP medication side effects: no Duration of  hyperlipidemia: chronic Cholesterol medication side effects: no Cholesterol supplements: none Medication compliance: good compliance Aspirin: no Recent stressors: no Recurrent headaches: no Visual changes: no Palpitations: no Dyspnea: no Chest pain: no Lower extremity edema: no Dizzy/lightheaded: no   Relevant past medical, surgical, family and social history reviewed and updated as indicated. Interim medical history since our last visit  reviewed. Allergies and medications reviewed and updated.  Review of Systems  Constitutional:  Negative for activity change, diaphoresis, fatigue and fever.  Respiratory:  Negative for cough, chest tightness, shortness of breath and wheezing.   Cardiovascular:  Negative for chest pain, palpitations and leg swelling.  Gastrointestinal: Negative.   Musculoskeletal:  Positive for back pain.  Neurological: Negative.   Psychiatric/Behavioral: Negative.      Per HPI unless specifically indicated above     Objective:    BP 135/81   Pulse 69   Temp 98.4 F (36.9 C) (Oral)   Ht 6' 2 (1.88 m)   Wt 227 lb (103 kg)   SpO2 98%   BMI 29.15 kg/m   Wt Readings from Last 3 Encounters:  10/24/23 227 lb (103 kg)  07/31/23 228 lb 9.6 oz (103.7 kg)  07/09/23 226 lb (102.5 kg)    Physical Exam Vitals and nursing note reviewed.  Constitutional:      General: He is awake. He is not in acute distress.    Appearance: He is well-developed and well-groomed. He is not ill-appearing or toxic-appearing.  HENT:     Head: Normocephalic.     Right Ear: Hearing and external ear normal.     Left Ear: Hearing and external ear normal.  Eyes:     General: Lids are normal.     Extraocular Movements: Extraocular movements intact.     Conjunctiva/sclera: Conjunctivae normal.  Neck:     Thyroid : No thyromegaly.     Vascular: No carotid bruit.  Cardiovascular:     Rate and Rhythm: Normal rate and regular rhythm.     Heart sounds: Normal heart sounds. No murmur heard.    No gallop.  Pulmonary:     Effort: No accessory muscle usage or respiratory distress.     Breath sounds: Normal breath sounds.  Abdominal:     General: Bowel sounds are normal. There is no distension.     Palpations: Abdomen is soft.     Tenderness: There is no abdominal tenderness.  Musculoskeletal:     Cervical back: Full passive range of motion without pain.     Lumbar back: Tenderness present. No swelling, edema or bony  tenderness. Decreased range of motion.     Right lower leg: No edema.     Left lower leg: No edema.     Comments: Antalgic gait.  Lymphadenopathy:     Cervical: No cervical adenopathy.  Skin:    General: Skin is warm.     Capillary Refill: Capillary refill takes less than 2 seconds.  Neurological:     Mental Status: He is alert.     Cranial Nerves: Cranial nerves 2-12 are intact.     Motor: Motor function is intact.     Coordination: Coordination is intact.     Deep Tendon Reflexes: Reflexes are normal and symmetric.     Reflex Scores:      Brachioradialis reflexes are 2+ on the right side and 2+ on the left side.      Patellar reflexes are 2+ on the right side and 2+ on the left side.  Comments: Poor recall and short term memory.  Not oriented to year, day, and month.  Psychiatric:        Attention and Perception: Attention normal.        Mood and Affect: Mood normal.        Speech: Speech normal.        Behavior: Behavior normal. Behavior is cooperative.        Thought Content: Thought content normal.    Results for orders placed or performed in visit on 10/24/23  Microalbumin, Urine Waived   Collection Time: 10/24/23 11:24 AM  Result Value Ref Range   Microalb, Ur Waived 150 (H) 0 - 19 mg/L   Creatinine, Urine Waived 300 10 - 300 mg/dL   Microalb/Creat Ratio 30-300 (H) <30 mg/g      Assessment & Plan:   Problem List Items Addressed This Visit       Cardiovascular and Mediastinum   Primary hypertension   Chronic, ongoing.  BP stable today. Continue Losartan -HCTZ 100-12.5 MG and Amlodipine  10 MG.  Recommend he monitor BP at least a few mornings a week at home and document.  DASH diet at home.  Continue current medication regimen and adjust as needed.  Labs: CBC, CMP, TSH, urine ALB.  Urine ALB 150 July 2025.       Relevant Orders   Microalbumin, Urine Waived (Completed)   TSH   Comprehensive metabolic panel with GFR   CBC with Differential/Platelet      Endocrine   Other hyperparathyroidism (HCC)   Ongoing, being followed with nephrology -- recommend they schedule a return visit.  Continue this collaboration, last note and labs reviewed.  Obtain updated labs today.        Nervous and Auditory   Chronic midline low back pain with bilateral sciatica   Chronic, ongoing. Has gone to 2 pain clinics and recent one they did not have good experience. Referral placed for new pain clinic to takeover his Oxycodone  Ir.  Will send in 30 day supply at present to bridge him until pain clinic visit, maintain at [facility] as ordered which was a reduction by PCP on last script.  Pain medication offers benefit to his daily life and ADLs.  Follows with neurosurgery as needed, continue this collaboration, recent notes reviewed.      Relevant Medications   oxyCODONE  (ROXICODONE ) 15 MG immediate release tablet   Other Relevant Orders   Ambulatory referral to Pain Clinic     Genitourinary   Stage 3a chronic kidney disease (HCC) - Primary   Chronic, ongoing. Recommend he return to nephrology for follow-up.  Labs today.  Current kidney failure risk at 5 years is 1.21% on calculation.  May benefit SGLT2 in future. Avoid Ibuprofen  and contrast.      Relevant Orders   Microalbumin, Urine Waived (Completed)   Comprehensive metabolic panel with GFR   CBC with Differential/Platelet     Other   History of prostate cancer   Performed 11/28/21.  Is to be following with urology, continue this collaboration.  Recent note reviewed. PSA today.      Relevant Orders   PSA   Vitamin D  deficiency   Noted past labs, recheck today and start supplement as needed.      Relevant Orders   VITAMIN D  25 Hydroxy (Vit-D Deficiency, Fractures)   Nicotine dependence due to vaping tobacco product   I have recommended complete cessation of tobacco use. I have discussed various options available for assistance with tobacco  cessation including over the counter methods (Nicotine gum, patch  and lozenges). We also discussed prescription options (Chantix, Nicotine Inhaler / Nasal Spray). The patient is not interested in pursuing any prescription tobacco cessation options at this time.  Lung cancer screening ordered, but has not attended.       Memory changes   Per Todd Craig this has been present awhile, but is worsening.  6CIT 23 today, reports year as 26.  Poor recall and short term memory.  Will obtain labs today and order MRI brain to further assess.  Discussed with them today.  His mother had dementia.  Determine next steps after all testing returned.      Relevant Orders   Vitamin B12   TSH   RPR   MR Brain Wo Contrast   Hypercholesteremia   Ongoing, chronic.  Continue current medication regimen and adjust as needed. Lipid panel today.      Relevant Orders   Lipid Panel w/o Chol/HDL Ratio   Comprehensive metabolic panel with GFR     Follow up plan: Return in about 5 weeks (around 11/28/2023) for MEMORY CHANGES.

## 2023-10-24 NOTE — Assessment & Plan Note (Signed)
 Ongoing, chronic.  Continue current medication regimen and adjust as needed. Lipid panel today.

## 2023-10-24 NOTE — Assessment & Plan Note (Signed)
 Noted past labs, recheck today and start supplement as needed.

## 2023-10-24 NOTE — Assessment & Plan Note (Signed)
 Chronic, ongoing. Has gone to 2 pain clinics and recent one they did not have good experience. Referral placed for new pain clinic to takeover his Oxycodone  Ir.  Will send in 30 day supply at present to bridge him until pain clinic visit, maintain at [facility] as ordered which was a reduction by PCP on last script.  Pain medication offers benefit to his daily life and ADLs.  Follows with neurosurgery as needed, continue this collaboration, recent notes reviewed.

## 2023-10-24 NOTE — Assessment & Plan Note (Signed)
 I have recommended complete cessation of tobacco use. I have discussed various options available for assistance with tobacco cessation including over the counter methods (Nicotine gum, patch and lozenges). We also discussed prescription options (Chantix, Nicotine Inhaler / Nasal Spray). The patient is not interested in pursuing any prescription tobacco cessation options at this time.  Lung cancer screening ordered, but has not attended.

## 2023-10-24 NOTE — Assessment & Plan Note (Signed)
 Performed 11/28/21.  Is to be following with urology, continue this collaboration.  Recent note reviewed. PSA today.

## 2023-10-24 NOTE — Assessment & Plan Note (Signed)
 Per Erminio this has been present awhile, but is worsening.  6CIT 23 today, reports year as 9.  Poor recall and short term memory.  Will obtain labs today and order MRI brain to further assess.  Discussed with them today.  His mother had dementia.  Determine next steps after all testing returned.

## 2023-10-24 NOTE — Assessment & Plan Note (Signed)
 Ongoing, being followed with nephrology -- recommend they schedule a return visit.  Continue this collaboration, last note and labs reviewed.  Obtain updated labs today.

## 2023-10-24 NOTE — Assessment & Plan Note (Signed)
 Chronic, ongoing. Recommend he return to nephrology for follow-up.  Labs today.  Current kidney failure risk at 5 years is 1.21% on calculation.  May benefit SGLT2 in future. Avoid Ibuprofen  and contrast.

## 2023-10-25 ENCOUNTER — Ambulatory Visit: Payer: Self-pay | Admitting: Nurse Practitioner

## 2023-10-25 LAB — COMPREHENSIVE METABOLIC PANEL WITH GFR
ALT: 11 IU/L (ref 0–44)
AST: 17 IU/L (ref 0–40)
Albumin: 4.5 g/dL (ref 3.9–4.9)
Alkaline Phosphatase: 100 IU/L (ref 44–121)
BUN/Creatinine Ratio: 12 (ref 10–24)
BUN: 15 mg/dL (ref 8–27)
Bilirubin Total: 0.4 mg/dL (ref 0.0–1.2)
CO2: 20 mmol/L (ref 20–29)
Calcium: 11.3 mg/dL — ABNORMAL HIGH (ref 8.6–10.2)
Chloride: 106 mmol/L (ref 96–106)
Creatinine, Ser: 1.3 mg/dL — ABNORMAL HIGH (ref 0.76–1.27)
Globulin, Total: 2.7 g/dL (ref 1.5–4.5)
Glucose: 93 mg/dL (ref 70–99)
Potassium: 4 mmol/L (ref 3.5–5.2)
Sodium: 142 mmol/L (ref 134–144)
Total Protein: 7.2 g/dL (ref 6.0–8.5)
eGFR: 63 mL/min/1.73 (ref >59)

## 2023-10-25 LAB — RPR: RPR Ser Ql: NONREACTIVE

## 2023-10-25 LAB — CBC WITH DIFFERENTIAL/PLATELET
Basophils Absolute: 0.1 x10E3/uL (ref 0.0–0.2)
Basos: 1 %
EOS (ABSOLUTE): 0.3 x10E3/uL (ref 0.0–0.4)
Eos: 3 %
Hematocrit: 51.2 % — ABNORMAL HIGH (ref 37.5–51.0)
Hemoglobin: 16.5 g/dL (ref 13.0–17.7)
Immature Grans (Abs): 0 x10E3/uL (ref 0.0–0.1)
Immature Granulocytes: 0 %
Lymphocytes Absolute: 1.9 x10E3/uL (ref 0.7–3.1)
Lymphs: 21 %
MCH: 33 pg (ref 26.6–33.0)
MCHC: 32.2 g/dL (ref 31.5–35.7)
MCV: 102 fL — ABNORMAL HIGH (ref 79–97)
Monocytes Absolute: 0.6 x10E3/uL (ref 0.1–0.9)
Monocytes: 6 %
Neutrophils Absolute: 6.3 x10E3/uL (ref 1.4–7.0)
Neutrophils: 69 %
Platelets: 192 x10E3/uL (ref 150–450)
RBC: 5 x10E6/uL (ref 4.14–5.80)
RDW: 12.4 % (ref 11.6–15.4)
WBC: 9.1 x10E3/uL (ref 3.4–10.8)

## 2023-10-25 LAB — TSH: TSH: 0.551 u[IU]/mL (ref 0.450–4.500)

## 2023-10-25 LAB — VITAMIN B12: Vitamin B-12: 437 pg/mL (ref 232–1245)

## 2023-10-25 LAB — LIPID PANEL W/O CHOL/HDL RATIO
Cholesterol, Total: 159 mg/dL (ref 100–199)
HDL: 32 mg/dL — ABNORMAL LOW (ref >39)
LDL Chol Calc (NIH): 97 mg/dL (ref 0–99)
Triglycerides: 171 mg/dL — ABNORMAL HIGH (ref 0–149)
VLDL Cholesterol Cal: 30 mg/dL (ref 5–40)

## 2023-10-25 LAB — VITAMIN D 25 HYDROXY (VIT D DEFICIENCY, FRACTURES): Vit D, 25-Hydroxy: 12.9 ng/mL — ABNORMAL LOW (ref 30.0–100.0)

## 2023-10-25 LAB — PSA: Prostate Specific Ag, Serum: <0.1 ng/mL (ref 0.0–4.0)

## 2023-10-25 NOTE — Progress Notes (Signed)
 Good afternoon, please let Todd Craig know labs have returned: - Vitamin D  is too low.  Please start taking over the counter Vitamin D3 2000 units daily for overall bone health. - Lipid panel shows levels not quite at goal, we will discuss increasing Rosuvastatin  at next visit. - Remainder of labs overall stable.  Including no syphilis and prostate blood work is normal.  Any questions? Keep being amazing!!  Thank you for allowing me to participate in your care.  I appreciate you. Kindest regards, Ezequias Lard

## 2023-10-28 NOTE — Telephone Encounter (Signed)
 CRM # [id number] Owner: None Status: Unresolved Open  Priority: Routine Created on: 10/28/2023 11:47 AM By: Hobart Delon SAUNDERS   Primary Information  Source  Dannial Dames (Patient)   Subject  Dannial Dames (Patient)   Topic  Clinical - Lab/Test Results    Communication  Reason for CRM: read results verbatim per instructions, no questions     Patient Information  Patient Name Gender DOB SSN  Todd Craig, Todd Craig Male [birth year] [id number]   Contacts  Contact Date/Time Type Contact Phone/Fax  10/28/2023 11:46 AM EDT Phone (Incoming) [place], [place] (Self) [phone]   Routing History   From To Priority  10/28/2023 11:49 AM Todd-Gorman, Delon SAUNDERS P [facility] Routine

## 2023-11-11 ENCOUNTER — Telehealth: Payer: Self-pay | Admitting: Nurse Practitioner

## 2023-11-11 DIAGNOSIS — G8929 Other chronic pain: Secondary | ICD-10-CM

## 2023-11-11 NOTE — Telephone Encounter (Signed)
 Referral team, can you guys check on this referral please?

## 2023-11-11 NOTE — Telephone Encounter (Signed)
New referral needed.

## 2023-11-11 NOTE — Telephone Encounter (Signed)
 Copied from CRM [id number]. Topic: Referral - Question >> Nov 11, 2023 12:15 PM Todd Craig wrote: Reason for CRM:  Todd Craig is calling to check on the referral that was discussed in last office visit: From NP Cannady notes: Referral placed for new pain clinic to takeover his Oxycodone  Ir.  Will send in 30 day supply at present to bridge him until pain clinic visit, maintain at [facility] as ordered which was a reduction by PCP on last scrip Chart shows the referral to a Pain Clinic was closed out. Please call Todd Craig to discuss referral at [phone]

## 2023-11-12 ENCOUNTER — Ambulatory Visit
Admission: RE | Admit: 2023-11-12 | Discharge: 2023-11-12 | Disposition: A | Discharge status: Home / Self Care | Source: Ambulatory Visit | Attending: Nurse Practitioner | Admitting: Nurse Practitioner

## 2023-11-12 DIAGNOSIS — Z8546 Personal history of malignant neoplasm of prostate: Secondary | ICD-10-CM | POA: Diagnosis not present

## 2023-11-12 DIAGNOSIS — R413 Other amnesia: Secondary | ICD-10-CM | POA: Insufficient documentation

## 2023-11-12 DIAGNOSIS — F0390 Unspecified dementia without behavioral disturbance: Secondary | ICD-10-CM | POA: Diagnosis not present

## 2023-11-18 NOTE — Progress Notes (Signed)
 Please let Todd Craig's support person know that imaging has returned and no acute issues present.  He does have long term changes from his past stroke and hypertension.  We do see memory changes at times with past stroke and these can increase over time for some patients. I suspect some of what we are seeing is related to this.  Recommend return to neurology in future, which we can discuss more next visit + goal is to keep his blood pressure <130/80.  Any questions? Keep being stellar!!  Thank you for allowing me to participate in your care.  I appreciate you. Kindest regards, Callaway Hardigree

## 2023-11-19 ENCOUNTER — Telehealth: Payer: Self-pay | Admitting: Neurology

## 2023-11-19 NOTE — Telephone Encounter (Signed)
 Patient's wife, Erminio Pepper said PCP ordered an MRI and would like Dr. Gregg to review the results. Would like a call back.

## 2023-11-25 NOTE — Telephone Encounter (Signed)
 Please schedule patient for a follow up to discuss these results. There is an opening this Wednesday but please check with referral first prior to scheduling patient. Thanks

## 2023-11-25 NOTE — Telephone Encounter (Signed)
 Pt called , returning call  informed Nurse will call back

## 2023-11-25 NOTE — Telephone Encounter (Signed)
 Lmtrc 1st attempt by hf 11/25/23

## 2023-11-25 NOTE — Telephone Encounter (Signed)
 Called and spoke to Todd Craig and got pt scheduled and Todd Craig agreed to 11:45am for a Friday 8/29

## 2023-12-08 NOTE — Patient Instructions (Incomplete)
 STOP LOSARTAN -HYDROCHLOROTHIAZIDE  and start Valsartan  and Chlorthalidone !!  Continue Amlodipine .  Memory Compensation Strategies  Use WARM strategy.  W= write it down  A= associate it  R= repeat it  M= make a mental note  2.   You can keep a Glass blower/designer.  Use a 3-ring notebook with sections for the following: calendar, important names and phone numbers,  medications, doctors' names/phone numbers, lists/reminders, and a section to journal what you did  each day.   3.    Use a calendar to write appointments down.  4.    Write yourself a schedule for the day.  This can be placed on the calendar or in a separate section of the Memory Notebook.  Keeping a  regular schedule can help memory.  5.    Use medication organizer with sections for each day or morning/evening pills.  You may need help loading it  6.    Keep a basket, or pegboard by the door.  Place items that you need to take out with you in the basket or on the pegboard.  You may also want to  include a message board for reminders.  7.    Use sticky notes.  Place sticky notes with reminders in a place where the task is performed.  For example:  turn off the  stove placed by the stove, lock the door placed on the door at eye level,  take your medications on  the bathroom mirror or by the place where you normally take your medications.  8.    Use alarms/timers.  Use while cooking to remind yourself to check on food or as a reminder to take your medicine, or as a  reminder to make a call, or as a reminder to perform another task, etc.

## 2023-12-12 ENCOUNTER — Ambulatory Visit: Admitting: Nurse Practitioner

## 2023-12-12 ENCOUNTER — Encounter: Payer: Self-pay | Admitting: Nurse Practitioner

## 2023-12-12 VITALS — BP 145/93 | HR 71 | Temp 98.3°F | Ht 74.0 in | Wt 225.8 lb

## 2023-12-12 DIAGNOSIS — E7800 Pure hypercholesterolemia, unspecified: Secondary | ICD-10-CM | POA: Diagnosis not present

## 2023-12-12 DIAGNOSIS — I10 Essential (primary) hypertension: Secondary | ICD-10-CM

## 2023-12-12 DIAGNOSIS — Z8673 Personal history of transient ischemic attack (TIA), and cerebral infarction without residual deficits: Secondary | ICD-10-CM | POA: Diagnosis not present

## 2023-12-12 DIAGNOSIS — M5442 Lumbago with sciatica, left side: Secondary | ICD-10-CM

## 2023-12-12 DIAGNOSIS — M5441 Lumbago with sciatica, right side: Secondary | ICD-10-CM | POA: Diagnosis not present

## 2023-12-12 DIAGNOSIS — N1831 Chronic kidney disease, stage 3a: Secondary | ICD-10-CM | POA: Diagnosis not present

## 2023-12-12 DIAGNOSIS — F17290 Nicotine dependence, other tobacco product, uncomplicated: Secondary | ICD-10-CM

## 2023-12-12 DIAGNOSIS — R413 Other amnesia: Secondary | ICD-10-CM | POA: Diagnosis not present

## 2023-12-12 DIAGNOSIS — G8929 Other chronic pain: Secondary | ICD-10-CM

## 2023-12-12 MED ORDER — VALSARTAN 160 MG PO TABS: 160.0000 mg | ORAL_TABLET | Freq: Every day | ORAL | 1 refills | Status: DC

## 2023-12-12 MED ORDER — OXYCODONE HCL 15 MG PO TABS
15.0000 mg | ORAL_TABLET | Freq: Three times a day (TID) | ORAL | 0 refills | Status: DC | PRN
Start: 2023-12-12 — End: 2024-01-13

## 2023-12-12 MED ORDER — CHLORTHALIDONE 25 MG PO TABS: 25.0000 mg | ORAL_TABLET | Freq: Every day | ORAL | 1 refills | Status: DC

## 2023-12-12 NOTE — Progress Notes (Signed)
 BP (!) 145/93   Pulse 71   Temp 98.3 F (36.8 C) (Oral)   Ht 6' 2 (1.88 m)   Wt 225 lb 12.8 oz (102.4 kg)   SpO2 97%   BMI 28.99 kg/m    Subjective:    Patient ID: Todd Craig, male    DOB: 24-Oct-1960, 63 y.o.   MRN: [id number]  HPI: Todd Craig is a 63 y.o. male  Chief Complaint  Patient presents with   Memory changes     Review mri   Pain Management    [facility]    His friend, Erminio, who attends all visits with him is present at bedside.  MEMORY CHANGES Memory changes reported by friend Erminio last visit. Had MRI done on 11/12/23 which noted chronic small vessel disease and numerous chronic microhemorrhages which are unchanged from previous imaging.  Has history of CVA. Is forgetting things often. Takes awhile to get ready in the morning, but doing opposite of what she is asking.  Gets angry easily.  Has family history of dementia, mother. No recent alcohol  or drug use. Continues to smoke cigarettes daily -- 1 PPD.  Taking Hyzaar 100-12.5 MG daily and Amlodipine  10 MG daily for HTN but levels above goal at home per [place] and above goal in office.  Last imaging: 11/12/23 Neurology visits: goes tomorrow to neurology Current memory care medications: none Medication compliance: as above Family support: friend Living situation: as above Recent Falls: not recent Incontinent/Continent: continent Meal intake: eating meals well Behaviors: irritability Sleep pattern: sleeps a lot    10/24/2023   11:55 AM  6CIT Screen  What Year? 4 points  What month? 0 points  What time? 3 points  Count back from 20 2 points  Months in reverse 4 points  Repeat phrase 10 points  Total Score 23 points   CHRONIC PAIN  Went to pain clinic in [place] and [place] without benefit. Completed physical therapy in past.  Oxycodone  Ir 10 MG was his pain management -- last fill 10/25/23. They are aware PCP does not perform chronic pain management.  Would like referral to pain clinic in [place] which has been recommended by friends. Present dose:  Morphine equivalents Pain control status: exacerbated Duration: years Location: back pain Quality: dull, aching, and throbbing Current Pain Level: 9/10 Previous Pain Level: 10/10 Breakthrough pain: no Benefit from narcotic medications: yes What Activities task can be accomplished with current medication? Daily ADLs Interested in weaning off narcotics:no   Stool softners/OTC fiber: yes  Previous pain specialty evaluation: yes Non-narcotic analgesic meds: yes Narcotic contract: with pain management  HYPERTENSION / HYPERLIPIDEMIA Taking Amlodipine , Losartan -HCTZ, and Crestor . Satisfied with current treatment? yes Duration of hypertension: chronic BP monitoring frequency: not checking BP range:  BP medication side effects: no Duration of hyperlipidemia: chronic Cholesterol medication side effects: no Cholesterol supplements: none Medication compliance: good compliance Aspirin: no Recent stressors: no Recurrent headaches: no Visual changes: no Palpitations: no Dyspnea: no Chest pain: no Lower extremity edema: no Dizzy/lightheaded: no   CHRONIC KIDNEY DISEASE CKD status: stable Medications renally dose: yes Previous renal evaluation: yes Pneumovax:  Up to Date Influenza Vaccine:  Up to Date   Relevant past medical, surgical, family and social history reviewed and updated as indicated. Interim medical history since our last visit reviewed. Allergies and medications reviewed and updated.  Review of Systems  Constitutional:  Negative for activity change, diaphoresis, fatigue and fever.  Respiratory:  Negative  for cough, chest tightness, shortness of breath and wheezing.   Cardiovascular:  Negative for chest pain, palpitations and leg swelling.  Gastrointestinal: Negative.   Musculoskeletal:  Positive for back pain.  Neurological: Negative.   Psychiatric/Behavioral:  Negative.      Per HPI unless specifically indicated above     Objective:    BP (!) 145/93   Pulse 71   Temp 98.3 F (36.8 C) (Oral)   Ht 6' 2 (1.88 m)   Wt 225 lb 12.8 oz (102.4 kg)   SpO2 97%   BMI 28.99 kg/m   Wt Readings from Last 3 Encounters:  12/12/23 225 lb 12.8 oz (102.4 kg)  10/24/23 227 lb (103 kg)  07/31/23 228 lb 9.6 oz (103.7 kg)    Physical Exam Vitals and nursing note reviewed.  Constitutional:      General: He is awake. He is not in acute distress.    Appearance: He is well-developed and well-groomed. He is not ill-appearing or toxic-appearing.  HENT:     Head: Normocephalic.     Right Ear: Hearing and external ear normal.     Left Ear: Hearing and external ear normal.  Eyes:     General: Lids are normal.     Extraocular Movements: Extraocular movements intact.     Conjunctiva/sclera: Conjunctivae normal.  Neck:     Thyroid : No thyromegaly.     Vascular: No carotid bruit.  Cardiovascular:     Rate and Rhythm: Normal rate and regular rhythm.     Heart sounds: Normal heart sounds. No murmur heard.    No gallop.  Pulmonary:     Effort: No accessory muscle usage or respiratory distress.     Breath sounds: Normal breath sounds.  Abdominal:     General: Bowel sounds are normal. There is no distension.     Palpations: Abdomen is soft.     Tenderness: There is no abdominal tenderness.  Musculoskeletal:     Cervical back: Full passive range of motion without pain.     Lumbar back: Tenderness present. No swelling, edema or bony tenderness. Decreased range of motion.     Right lower leg: No edema.     Left lower leg: No edema.     Comments: Antalgic gait.  Lymphadenopathy:     Cervical: No cervical adenopathy.  Skin:    General: Skin is warm.     Capillary Refill: Capillary refill takes less than 2 seconds.  Neurological:     Mental Status: He is alert.     Cranial Nerves: Cranial nerves 2-12 are intact.     Motor: Motor function is intact.      Coordination: Coordination is intact.     Deep Tendon Reflexes: Reflexes are normal and symmetric.     Reflex Scores:      Brachioradialis reflexes are 2+ on the right side and 2+ on the left side.      Patellar reflexes are 2+ on the right side and 2+ on the left side.    Comments: Patient reports: Year = 2005 Month = August Day of week = Thursday President = [facility]  Psychiatric:        Attention and Perception: Attention normal.        Mood and Affect: Mood normal.        Speech: Speech normal.        Behavior: Behavior normal. Behavior is cooperative.        Thought Content: Thought content  normal.    Results for orders placed or performed in visit on 10/24/23  Microalbumin, Urine Waived   Collection Time: 10/24/23 11:24 AM  Result Value Ref Range   Microalb, Ur Waived 150 (H) 0 - 19 mg/L   Creatinine, Urine Waived 300 10 - 300 mg/dL   Microalb/Creat Ratio 30-300 (H) <30 mg/g  VITAMIN D  25 Hydroxy (Vit-D Deficiency, Fractures)   Collection Time: 10/24/23 11:26 AM  Result Value Ref Range   Vit D, 25-Hydroxy 12.9 (L) 30.0 - 100.0 ng/mL  Vitamin B12   Collection Time: 10/24/23 11:26 AM  Result Value Ref Range   Vitamin B-12 437 232 - 1,245 pg/mL  TSH   Collection Time: 10/24/23 11:26 AM  Result Value Ref Range   TSH 0.551 0.450 - 4.500 uIU/mL  Lipid Panel w/o Chol/HDL Ratio   Collection Time: 10/24/23 11:26 AM  Result Value Ref Range   Cholesterol, Total 159 100 - 199 mg/dL   Triglycerides 828 (H) 0 - 149 mg/dL   HDL 32 (L) >60 mg/dL   VLDL Cholesterol Cal 30 5 - 40 mg/dL   LDL Chol Calc (NIH) 97 0 - 99 mg/dL  Comprehensive metabolic panel with GFR   Collection Time: 10/24/23 11:26 AM  Result Value Ref Range   Glucose 93 70 - 99 mg/dL   BUN 15 8 - 27 mg/dL   Creatinine, Ser 8.69 (H) 0.76 - 1.27 mg/dL   eGFR 63 >40 fO/fpw/8.26   BUN/Creatinine Ratio 12 10 - 24   Sodium 142 134 - 144 mmol/L   Potassium 4.0 3.5 - 5.2 mmol/L   Chloride 106 96 - 106  mmol/L   CO2 20 20 - 29 mmol/L   Calcium  11.3 (H) 8.6 - 10.2 mg/dL   Total Protein 7.2 6.0 - 8.5 g/dL   Albumin 4.5 3.9 - 4.9 g/dL   Globulin, Total 2.7 1.5 - 4.5 g/dL   Bilirubin Total 0.4 0.0 - 1.2 mg/dL   Alkaline Phosphatase 100 44 - 121 IU/L   AST 17 0 - 40 IU/L   ALT 11 0 - 44 IU/L  CBC with Differential/Platelet   Collection Time: 10/24/23 11:26 AM  Result Value Ref Range   WBC 9.1 3.4 - 10.8 x10E3/uL   RBC 5.00 4.14 - 5.80 x10E6/uL   Hemoglobin 16.5 13.0 - 17.7 g/dL   Hematocrit 48.7 (H) 62.4 - 51.0 %   MCV 102 (H) 79 - 97 fL   MCH 33.0 26.6 - 33.0 pg   MCHC 32.2 31.5 - 35.7 g/dL   RDW 87.5 88.3 - 84.5 %   Platelets 192 150 - 450 x10E3/uL   Neutrophils 69 Not Estab. %   Lymphs 21 Not Estab. %   Monocytes 6 Not Estab. %   Eos 3 Not Estab. %   Basos 1 Not Estab. %   Neutrophils Absolute 6.3 1.4 - 7.0 x10E3/uL   Lymphocytes Absolute 1.9 0.7 - 3.1 x10E3/uL   Monocytes Absolute 0.6 0.1 - 0.9 x10E3/uL   EOS (ABSOLUTE) 0.3 0.0 - 0.4 x10E3/uL   Basophils Absolute 0.1 0.0 - 0.2 x10E3/uL   Immature Granulocytes 0 Not Estab. %   Immature Grans (Abs) 0.0 0.0 - 0.1 x10E3/uL  RPR   Collection Time: 10/24/23 11:26 AM  Result Value Ref Range   RPR Ser Ql Non Reactive Non Reactive   Interpretation: Comment   PSA   Collection Time: 10/24/23 11:26 AM  Result Value Ref Range   Prostate Specific Ag, Serum <0.1 0.0 - 4.0  ng/mL      Assessment & Plan:   Problem List Items Addressed This Visit       Cardiovascular and Mediastinum   Primary hypertension   Chronic, ongoing.  BP is above goal in office and above goal at home.  Discussed with them that for stroke prevention goal is BP <130/80.  Will continue Amlodipine  and try stopping Hyzaar and changing to Valsartan  160 MG daily and Chlorthalidone  25 MG daily which may offer more benefit.  Highly recommend he work towards smoking cessation.  Avoid alcohol  use. Recommend he monitor BP at least a few mornings a week at home and  document.  DASH diet at home.  Continue current medication regimen and adjust as needed.  Labs: up to date, recheck next visit.  Urine ALB 150 July 2025. - Recent K+ 4.0 and eGFR 63      Relevant Medications   chlorthalidone  (HYGROTON ) 25 MG tablet   valsartan  (DIOVAN ) 160 MG tablet     Nervous and Auditory   Chronic midline low back pain with bilateral sciatica   Chronic, ongoing. Has gone to 2 pain clinics and recent one they did not have good experience. Referral placed for new pain clinic to takeover his Oxycodone  Ir.  Will send in 30 day supply at present to bridge him until pain clinic visit, reduce to [facility] as a further reduction to reduce total daily dose of opioid and see if ongoing benefit  Pain medication offers benefit to his daily life and ADLs.  Follows with neurosurgery as needed, continue this collaboration, recent notes reviewed.      Relevant Medications   oxyCODONE  (ROXICODONE ) 15 MG immediate release tablet   Other Relevant Orders   Ambulatory referral to Pain Clinic     Genitourinary   Stage 3a chronic kidney disease (HCC) - Primary   Chronic, recent labs improving. Current kidney failure risk at 5 years is 1.21% on calculation.  May benefit SGLT2 in future, which will discuss with them next visit as may benefit BP levels too. Avoid Ibuprofen  and contrast. Renal dose medications as needed.        Other   Nicotine dependence due to vaping tobacco product   I have recommended complete cessation of tobacco use. I have discussed various options available for assistance with tobacco cessation including over the counter methods (Nicotine gum, patch and lozenges). We also discussed prescription options (Chantix, Nicotine Inhaler / Nasal Spray). The patient is not interested in pursuing any prescription tobacco cessation options at this time.  Lung cancer screening ordered, but has not attended.       Memory changes   Per Erminio this has been present awhile, but is  worsening.  6CIT 23 last visit, reports year as 2005, oriented to remainder of questions today.  Poor recall and short term memory.  History of stroke, ?some vascular dementia/ Recent imaging performed and no acute findings.  Discussed with them today.  His mother had dementia.  Sees neurology tomorrow for follow-up, appreciate their input.      Hypercholesteremia   Ongoing, chronic.  Continue current medication regimen and adjust further next visit as recent LDL above goal. Goal less than 55 due to history of CVA.      Relevant Medications   chlorthalidone  (HYGROTON ) 25 MG tablet   valsartan  (DIOVAN ) 160 MG tablet   H/O: stroke   CVA in 2012, discussed at length importance of smoking cessation + adequate BP control and cholesterol control.  Goal BP <  130/80, LDL <55, A1c <6.5%.           Follow up plan: Return in about 4 weeks (around 01/09/2024) for HTN -- changed Valsartan  and Chlorthalidone .

## 2023-12-12 NOTE — Assessment & Plan Note (Signed)
 Chronic, ongoing.  BP is above goal in office and above goal at home.  Discussed with them that for stroke prevention goal is BP <130/80.  Will continue Amlodipine  and try stopping Hyzaar and changing to Valsartan  160 MG daily and Chlorthalidone  25 MG daily which may offer more benefit.  Highly recommend he work towards smoking cessation.  Avoid alcohol  use. Recommend he monitor BP at least a few mornings a week at home and document.  DASH diet at home.  Continue current medication regimen and adjust as needed.  Labs: up to date, recheck next visit.  Urine ALB 150 July 2025. - Recent K+ 4.0 and eGFR 63

## 2023-12-12 NOTE — Assessment & Plan Note (Signed)
 Per Erminio this has been present awhile, but is worsening.  6CIT 23 last visit, reports year as 2005, oriented to remainder of questions today.  Poor recall and short term memory.  History of stroke, ?some vascular dementia/ Recent imaging performed and no acute findings.  Discussed with them today.  His mother had dementia.  Sees neurology tomorrow for follow-up, appreciate their input.

## 2023-12-12 NOTE — Assessment & Plan Note (Signed)
 I have recommended complete cessation of tobacco use. I have discussed various options available for assistance with tobacco cessation including over the counter methods (Nicotine gum, patch and lozenges). We also discussed prescription options (Chantix, Nicotine Inhaler / Nasal Spray). The patient is not interested in pursuing any prescription tobacco cessation options at this time.  Lung cancer screening ordered, but has not attended.

## 2023-12-12 NOTE — Assessment & Plan Note (Signed)
 CVA in 2012, discussed at length importance of smoking cessation + adequate BP control and cholesterol control.  Goal BP <130/80, LDL <55, A1c <6.5%.

## 2023-12-12 NOTE — Assessment & Plan Note (Signed)
 Ongoing, chronic.  Continue current medication regimen and adjust further next visit as recent LDL above goal. Goal less than 55 due to history of CVA.

## 2023-12-12 NOTE — Assessment & Plan Note (Signed)
 Chronic, ongoing. Has gone to 2 pain clinics and recent one they did not have good experience. Referral placed for new pain clinic to takeover his Oxycodone  Ir.  Will send in 30 day supply at present to bridge him until pain clinic visit, reduce to [facility] as a further reduction to reduce total daily dose of opioid and see if ongoing benefit  Pain medication offers benefit to his daily life and ADLs.  Follows with neurosurgery as needed, continue this collaboration, recent notes reviewed.

## 2023-12-12 NOTE — Assessment & Plan Note (Addendum)
 Chronic, recent labs improving. Current kidney failure risk at 5 years is 1.21% on calculation.  May benefit SGLT2 in future, which will discuss with them next visit as may benefit BP levels too. Avoid Ibuprofen  and contrast. Renal dose medications as needed.

## 2023-12-13 ENCOUNTER — Telehealth: Payer: Self-pay | Admitting: Neurology

## 2023-12-13 ENCOUNTER — Ambulatory Visit: Admitting: Neurology

## 2023-12-13 NOTE — Telephone Encounter (Signed)
 Due to a conflict pt had to r/s

## 2023-12-18 ENCOUNTER — Telehealth: Payer: Self-pay | Admitting: Nurse Practitioner

## 2023-12-18 NOTE — Telephone Encounter (Signed)
 Copied from CRM #[id number]. Topic: Referral - Question >> Dec 18, 2023 11:11 AM Debby BROCKS wrote: Reason for CRM:  Patient states that a Referral for [facility] was said to have been sent out due to spine pain but when they call they state that theres no referral there and are advised to reach back out to P[facility]

## 2024-01-12 NOTE — Patient Instructions (Signed)

## 2024-01-13 ENCOUNTER — Ambulatory Visit: Admitting: Nurse Practitioner

## 2024-01-13 ENCOUNTER — Encounter: Payer: Self-pay | Admitting: Nurse Practitioner

## 2024-01-13 VITALS — BP 140/88 | HR 75 | Ht 74.5 in | Wt 228.2 lb

## 2024-01-13 DIAGNOSIS — I10 Essential (primary) hypertension: Secondary | ICD-10-CM

## 2024-01-13 DIAGNOSIS — F17290 Nicotine dependence, other tobacco product, uncomplicated: Secondary | ICD-10-CM | POA: Diagnosis not present

## 2024-01-13 DIAGNOSIS — M5442 Lumbago with sciatica, left side: Secondary | ICD-10-CM | POA: Diagnosis not present

## 2024-01-13 DIAGNOSIS — M5441 Lumbago with sciatica, right side: Secondary | ICD-10-CM | POA: Diagnosis not present

## 2024-01-13 DIAGNOSIS — G8929 Other chronic pain: Secondary | ICD-10-CM | POA: Diagnosis not present

## 2024-01-13 MED ORDER — OXYCODONE HCL 15 MG PO TABS: 15.0000 mg | ORAL_TABLET | Freq: Three times a day (TID) | ORAL | 0 refills | Status: DC | PRN

## 2024-01-13 MED ORDER — VALSARTAN 320 MG PO TABS: 320.0000 mg | ORAL_TABLET | Freq: Every day | ORAL | 3 refills | Status: AC

## 2024-01-13 NOTE — Progress Notes (Signed)
 BP (!) 140/88 (BP Location: Left Arm, Patient Position: Sitting, Cuff Size: Normal)   Pulse 75   Ht 6' 2.5 (1.892 m)   Wt 228 lb 3.2 oz (103.5 kg)   SpO2 96%   BMI 28.91 kg/m    Subjective:    Patient ID: Todd Craig, male    DOB: 03-Jan-1961, 63 y.o.   MRN: [id number]  HPI: Todd Craig is a 63 y.o. male  Chief Complaint  Patient presents with   Medication Management    PCP rejected pain management without reason given to patient, pt would like to know what happened for them to reject it   glute pain    Right side, pain level is an 8, constant, pain is progressively getting worse    His friend, Erminio, who attends all visits with him is present at bedside.  CHRONIC PAIN  Went to pain clinic in [place] ([facility]) and [place] without benefit Sherline). Recently sent referral to [facility] pain management in [place], but they report it was rejected. Completed physical therapy in past. Oxycodone  Ir 10 MG is his pain management (started by previous pain clinic) -- last fill 12/12/23. They are aware PCP does not perform chronic pain management.  Present dose: as above 10 MG Q8H PRN  Morphine equivalents 45 Pain control status: exacerbated Duration: years Location: back pain Quality: dull, aching, and throbbing Current Pain Level: 8/10 Previous Pain Level: 10/10 Breakthrough pain: no Benefit from narcotic medications: yes What Activities task can be accomplished with current medication? Daily ADLs Interested in weaning off narcotics:no   Stool softners/OTC fiber: yes  Previous pain specialty evaluation: yes Non-narcotic analgesic meds: yes Narcotic contract: with pain management  HYPERTENSION / HYPERLIPIDEMIA Changed medication last visit, Valsartan  160 MG daily and Chlorthalidone  25 MG daily. Continues Amlodipine  and Crestor . Continues to smoke, but is cutting back.  A pack is lasting more than one day. Satisfied with current treatment? yes Duration of  hypertension: chronic BP monitoring frequency: every other day BP range: 140/90 range or less BP medication side effects: no Duration of hyperlipidemia: chronic Cholesterol medication side effects: no Cholesterol supplements: none Medication compliance: good compliance Aspirin: no Recent stressors: no Recurrent headaches: no Visual changes: no Palpitations: no Dyspnea: no Chest pain: no Lower extremity edema: no Dizzy/lightheaded: no   Relevant past medical, surgical, family and social history reviewed and updated as indicated. Interim medical history since our last visit reviewed. Allergies and medications reviewed and updated.  Review of Systems  Constitutional:  Negative for activity change, diaphoresis, fatigue and fever.  Respiratory:  Negative for cough, chest tightness, shortness of breath and wheezing.   Cardiovascular:  Negative for chest pain, palpitations and leg swelling.  Gastrointestinal: Negative.   Musculoskeletal:  Positive for back pain.  Neurological: Negative.   Psychiatric/Behavioral: Negative.      Per HPI unless specifically indicated above     Objective:    BP (!) 140/88 (BP Location: Left Arm, Patient Position: Sitting, Cuff Size: Normal)   Pulse 75   Ht 6' 2.5 (1.892 m)   Wt 228 lb 3.2 oz (103.5 kg)   SpO2 96%   BMI 28.91 kg/m   Wt Readings from Last 3 Encounters:  01/13/24 228 lb 3.2 oz (103.5 kg)  12/12/23 225 lb 12.8 oz (102.4 kg)  10/24/23 227 lb (103 kg)    Physical Exam Vitals and nursing note reviewed.  Constitutional:      General: He is awake. He is not in  acute distress.    Appearance: He is well-developed and well-groomed. He is not ill-appearing or toxic-appearing.  HENT:     Head: Normocephalic.     Right Ear: Hearing and external ear normal.     Left Ear: Hearing and external ear normal.  Eyes:     General: Lids are normal.     Extraocular Movements: Extraocular movements intact.     Conjunctiva/sclera: Conjunctivae  normal.  Neck:     Thyroid : No thyromegaly.     Vascular: No carotid bruit.  Cardiovascular:     Rate and Rhythm: Normal rate and regular rhythm.     Heart sounds: Normal heart sounds. No murmur heard.    No gallop.  Pulmonary:     Effort: No accessory muscle usage or respiratory distress.     Breath sounds: Normal breath sounds.  Abdominal:     General: Bowel sounds are normal. There is no distension.     Palpations: Abdomen is soft.     Tenderness: There is no abdominal tenderness.  Musculoskeletal:     Cervical back: Full passive range of motion without pain.     Lumbar back: Tenderness present. No swelling, edema or bony tenderness. Decreased range of motion.     Right lower leg: No edema.     Left lower leg: No edema.     Comments: Antalgic gait.  Lymphadenopathy:     Cervical: No cervical adenopathy.  Skin:    General: Skin is warm.     Capillary Refill: Capillary refill takes less than 2 seconds.  Neurological:     Mental Status: He is alert.     Cranial Nerves: Cranial nerves 2-12 are intact.     Motor: Motor function is intact.     Coordination: Coordination is intact.     Deep Tendon Reflexes: Reflexes are normal and symmetric.     Reflex Scores:      Brachioradialis reflexes are 2+ on the right side and 2+ on the left side.      Patellar reflexes are 2+ on the right side and 2+ on the left side. Psychiatric:        Attention and Perception: Attention normal.        Mood and Affect: Mood normal.        Speech: Speech normal.        Behavior: Behavior normal. Behavior is cooperative.        Thought Content: Thought content normal.    Results for orders placed or performed in visit on 10/24/23  Microalbumin, Urine Waived   Collection Time: 10/24/23 11:24 AM  Result Value Ref Range   Microalb, Ur Waived 150 (H) 0 - 19 mg/L   Creatinine, Urine Waived 300 10 - 300 mg/dL   Microalb/Creat Ratio 30-300 (H) <30 mg/g  VITAMIN D  25 Hydroxy (Vit-D Deficiency,  Fractures)   Collection Time: 10/24/23 11:26 AM  Result Value Ref Range   Vit D, 25-Hydroxy 12.9 (L) 30.0 - 100.0 ng/mL  Vitamin B12   Collection Time: 10/24/23 11:26 AM  Result Value Ref Range   Vitamin B-12 437 232 - 1,245 pg/mL  TSH   Collection Time: 10/24/23 11:26 AM  Result Value Ref Range   TSH 0.551 0.450 - 4.500 uIU/mL  Lipid Panel w/o Chol/HDL Ratio   Collection Time: 10/24/23 11:26 AM  Result Value Ref Range   Cholesterol, Total 159 100 - 199 mg/dL   Triglycerides 828 (H) 0 - 149 mg/dL   HDL 32 (L) >  39 mg/dL   VLDL Cholesterol Cal 30 5 - 40 mg/dL   LDL Chol Calc (NIH) 97 0 - 99 mg/dL  Comprehensive metabolic panel with GFR   Collection Time: 10/24/23 11:26 AM  Result Value Ref Range   Glucose 93 70 - 99 mg/dL   BUN 15 8 - 27 mg/dL   Creatinine, Ser 8.69 (H) 0.76 - 1.27 mg/dL   eGFR 63 >40 fO/fpw/8.26   BUN/Creatinine Ratio 12 10 - 24   Sodium 142 134 - 144 mmol/L   Potassium 4.0 3.5 - 5.2 mmol/L   Chloride 106 96 - 106 mmol/L   CO2 20 20 - 29 mmol/L   Calcium  11.3 (H) 8.6 - 10.2 mg/dL   Total Protein 7.2 6.0 - 8.5 g/dL   Albumin 4.5 3.9 - 4.9 g/dL   Globulin, Total 2.7 1.5 - 4.5 g/dL   Bilirubin Total 0.4 0.0 - 1.2 mg/dL   Alkaline Phosphatase 100 44 - 121 IU/L   AST 17 0 - 40 IU/L   ALT 11 0 - 44 IU/L  CBC with Differential/Platelet   Collection Time: 10/24/23 11:26 AM  Result Value Ref Range   WBC 9.1 3.4 - 10.8 x10E3/uL   RBC 5.00 4.14 - 5.80 x10E6/uL   Hemoglobin 16.5 13.0 - 17.7 g/dL   Hematocrit 48.7 (H) 62.4 - 51.0 %   MCV 102 (H) 79 - 97 fL   MCH 33.0 26.6 - 33.0 pg   MCHC 32.2 31.5 - 35.7 g/dL   RDW 87.5 88.3 - 84.5 %   Platelets 192 150 - 450 x10E3/uL   Neutrophils 69 Not Estab. %   Lymphs 21 Not Estab. %   Monocytes 6 Not Estab. %   Eos 3 Not Estab. %   Basos 1 Not Estab. %   Neutrophils Absolute 6.3 1.4 - 7.0 x10E3/uL   Lymphocytes Absolute 1.9 0.7 - 3.1 x10E3/uL   Monocytes Absolute 0.6 0.1 - 0.9 x10E3/uL   EOS (ABSOLUTE) 0.3 0.0 -  0.4 x10E3/uL   Basophils Absolute 0.1 0.0 - 0.2 x10E3/uL   Immature Granulocytes 0 Not Estab. %   Immature Grans (Abs) 0.0 0.0 - 0.1 x10E3/uL  RPR   Collection Time: 10/24/23 11:26 AM  Result Value Ref Range   RPR Ser Ql Non Reactive Non Reactive   Interpretation: Comment   PSA   Collection Time: 10/24/23 11:26 AM  Result Value Ref Range   Prostate Specific Ag, Serum <0.1 0.0 - 4.0 ng/mL      Assessment & Plan:   Problem List Items Addressed This Visit       Cardiovascular and Mediastinum   Primary hypertension - Primary   Chronic, ongoing.  BP is trending down, but not at goal.  Discussed with them that for stroke prevention goal is BP <130/80.  Will continue Amlodipine  and Chlorthalidone  25 MG daily, but increase Valsartan  to 320 MG daily.  Changes are offering benefit, but not quite at goal.  Highly recommend he work towards smoking cessation.  Avoid alcohol  use. Recommend he monitor BP at least a few mornings a week at home and document.  DASH diet at home.  Continue current medication regimen and adjust as needed.  Labs: BMP.  Urine ALB 150 July 2025. - Recent K+ 4.0 and eGFR 63 - monitor K+ level closely.      Relevant Medications   valsartan  (DIOVAN ) 320 MG tablet   Other Relevant Orders   Basic metabolic panel with GFR     Nervous and  Auditory   Chronic midline low back pain with bilateral sciatica   Chronic, ongoing. Has gone to 2 pain clinics and recent one they did not have good experience. Referral placed for new pain clinic to takeover his Oxycodone  Ir.  Will send in 30 day supply at present to bridge him until pain clinic visit, maintain at [facility] as reduced to last visit to reduce total daily dose of opioid and see if ongoing benefit  Pain medication offers benefit to his daily life and ADLs.  Discussed attaining injections too, they are agreeable to this -- will place referral to [facility] for this.  Suspect they would offer benefit as main pain is his lower  back and SI area.      Relevant Orders   Ambulatory referral to Pain Clinic   Ambulatory referral to Orthopedic Surgery     Other   Nicotine dependence due to vaping tobacco product   I have recommended complete cessation of tobacco use. I have discussed various options available for assistance with tobacco cessation including over the counter methods (Nicotine gum, patch and lozenges). We also discussed prescription options (Chantix, Nicotine Inhaler / Nasal Spray). The patient is not interested in pursuing any prescription tobacco cessation options at this time.  Lung cancer screening ordered, but has not attended.           Follow up plan: Return in about 4 weeks (around 02/10/2024) for HTN AND CHRONIC PAIN -- increased Valsartan  to 320 MG daily.

## 2024-01-13 NOTE — Assessment & Plan Note (Signed)
 Chronic, ongoing.  BP is trending down, but not at goal.  Discussed with them that for stroke prevention goal is BP <130/80.  Will continue Amlodipine  and Chlorthalidone  25 MG daily, but increase Valsartan  to 320 MG daily.  Changes are offering benefit, but not quite at goal.  Highly recommend he work towards smoking cessation.  Avoid alcohol  use. Recommend he monitor BP at least a few mornings a week at home and document.  DASH diet at home.  Continue current medication regimen and adjust as needed.  Labs: BMP.  Urine ALB 150 July 2025. - Recent K+ 4.0 and eGFR 63 - monitor K+ level closely.

## 2024-01-13 NOTE — Assessment & Plan Note (Signed)
 I have recommended complete cessation of tobacco use. I have discussed various options available for assistance with tobacco cessation including over the counter methods (Nicotine gum, patch and lozenges). We also discussed prescription options (Chantix, Nicotine Inhaler / Nasal Spray). The patient is not interested in pursuing any prescription tobacco cessation options at this time.  Lung cancer screening ordered, but has not attended.

## 2024-01-13 NOTE — Assessment & Plan Note (Addendum)
 Chronic, ongoing. Has gone to 2 pain clinics and recent one they did not have good experience. Referral placed for new pain clinic to takeover his Oxycodone  Ir.  Will send in 30 day supply at present to bridge him until pain clinic visit, maintain at [facility] as reduced to last visit to reduce total daily dose of opioid and see if ongoing benefit  Pain medication offers benefit to his daily life and ADLs.  Discussed attaining injections too, they are agreeable to this -- will place referral to [facility] for this.  Suspect they would offer benefit as main pain is his lower back and SI area.

## 2024-01-14 ENCOUNTER — Ambulatory Visit: Payer: Self-pay | Admitting: Nurse Practitioner

## 2024-01-14 LAB — BASIC METABOLIC PANEL WITH GFR
BUN/Creatinine Ratio: 8 — ABNORMAL LOW (ref 10–24)
BUN: 11 mg/dL (ref 8–27)
CO2: 22 mmol/L (ref 20–29)
Calcium: 11.1 mg/dL — ABNORMAL HIGH (ref 8.6–10.2)
Chloride: 103 mmol/L (ref 96–106)
Creatinine, Ser: 1.43 mg/dL — ABNORMAL HIGH (ref 0.76–1.27)
Glucose: 86 mg/dL (ref 70–99)
Potassium: 4.3 mmol/L (ref 3.5–5.2)
Sodium: 141 mmol/L (ref 134–144)
eGFR: 55 mL/min/1.73 — ABNORMAL LOW (ref >59)

## 2024-01-14 NOTE — Progress Notes (Signed)
 Please let Ismail know labs have returned.  Kidneys continue to show Chronic Kidney Disease Stage 3a with no worsening.  We will continue to monitor.  Calcium  level a little elevated, cut back a little on calcium  rich foods.  Remainder of labs stable.  Any questions? Keep being amazing!!  Thank you for allowing me to participate in your care.  I appreciate you. Kindest regards, Rosalena Mccorry

## 2024-02-15 NOTE — Patient Instructions (Signed)
 Be Involved in Caring For Your Health:  Taking Medications When medications are taken as directed, they can greatly improve your health. But if they are not taken as prescribed, they may not work. In some cases, not taking them correctly can be harmful. To help ensure your treatment remains effective and safe, understand your medications and how to take them. Bring your medications to each visit for review by your provider.  Your lab results, notes, and after visit summary will be available on My Chart. We strongly encourage you to use this feature. If lab results are abnormal the clinic will contact you with the appropriate steps. If the clinic does not contact you assume the results are satisfactory. You can always view your results on My Chart. If you have questions regarding your health or results, please contact the clinic during office hours. You can also ask questions on My Chart.  We at [facility] are grateful that you chose us  to provide your care. We strive to provide evidence-based and compassionate care and are always looking for feedback. If you get a survey from the clinic please complete this so we can hear your opinions.  DASH Eating Plan DASH stands for Dietary Approaches to Stop Hypertension. The DASH eating plan is a healthy eating plan that has been shown to: Lower high blood pressure (hypertension). Reduce your risk for type 2 diabetes, heart disease, and stroke. Help with weight loss. What are tips for following this plan? Reading food labels Check food labels for the amount of salt (sodium) per serving. Choose foods with less than 5 percent of the Daily Value (DV) of sodium. In general, foods with less than 300 milligrams (mg) of sodium per serving fit into this eating plan. To find whole grains, look for the word whole as the first word in the ingredient list. Shopping Buy products labeled as low-sodium or no salt added. Buy fresh foods. Avoid canned  foods and pre-made or frozen meals. Cooking Try not to add salt when you cook. Use salt-free seasonings or herbs instead of table salt or sea salt. Check with your health care provider or pharmacist before using salt substitutes. Do not fry foods. Cook foods in healthy ways, such as baking, boiling, grilling, roasting, or broiling. Cook using oils that are good for your heart. These include olive, canola, avocado, soybean, and sunflower oil. Meal planning  Eat a balanced diet. This should include: 4 or more servings of fruits and 4 or more servings of vegetables each day. Try to fill half of your plate with fruits and vegetables. 6-8 servings of whole grains each day. 6 or less servings of lean meat, poultry, or fish each day. 1 oz is 1 serving. A 3 oz (85 g) serving of meat is about the same size as the palm of your hand. One egg is 1 oz (28 g). 2-3 servings of low-fat dairy each day. One serving is 1 cup (237 mL). 1 serving of nuts, seeds, or beans 5 times each week. 2-3 servings of heart-healthy fats. Healthy fats called omega-3 fatty acids are found in foods such as walnuts, flaxseeds, fortified milks, and eggs. These fats are also found in cold-water fish, such as sardines, salmon, and mackerel. Limit how much you eat of: Canned or prepackaged foods. Food that is high in trans fat, such as fried foods. Food that is high in saturated fat, such as fatty meat. Desserts and other sweets, sugary drinks, and other foods with added sugar. Full-fat  dairy products. Do not salt foods before eating. Do not eat more than 4 egg yolks a week. Try to eat at least 2 vegetarian meals a week. Eat more home-cooked food and less restaurant, buffet, and fast food. Lifestyle When eating at a restaurant, ask if your food can be made with less salt or no salt. If you drink alcohol: Limit how much you have to: 0-1 drink a day if you are male. 0-2 drinks a day if you are male. Know how much alcohol is in  your drink. In the [place] one drink is one 12 oz bottle of beer (355 mL), one 5 oz glass of wine (148 mL), or one 1 oz glass of hard liquor (44 mL). General information Avoid eating more than 2,300 mg of salt a day. If you have hypertension, you may need to reduce your sodium intake to 1,500 mg a day. Work with your provider to stay at a healthy body weight or lose weight. Ask what the best weight range is for you. On most days of the week, get at least 30 minutes of exercise that causes your heart to beat faster. This may include walking, swimming, or biking. Work with your provider or dietitian to adjust your eating plan to meet your specific calorie needs. What foods should I eat? Fruits All fresh, dried, or frozen fruit. Canned fruits that are in their natural juice and do not have sugar added to them. Vegetables Fresh or frozen vegetables that are raw, steamed, roasted, or grilled. Low-sodium or reduced-sodium tomato and vegetable juice. Low-sodium or reduced-sodium tomato sauce and tomato paste. Low-sodium or reduced-sodium canned vegetables. Grains Whole-grain or whole-wheat bread. Whole-grain or whole-wheat pasta. Brown rice. [place]neil Madeira. Bulgur. Whole-grain and low-sodium cereals. Pita bread. Low-fat, low-sodium crackers. Whole-wheat flour tortillas. Meats and other proteins Skinless chicken or [place]. Ground chicken or [place]. Pork with fat trimmed off. Fish and seafood. Egg whites. Dried beans, peas, or lentils. Unsalted nuts, nut butters, and seeds. Unsalted canned beans. Lean cuts of beef with fat trimmed off. Low-sodium, lean precooked or cured meat, such as sausages or meat loaves. Dairy Low-fat (1%) or fat-free (skim) milk. Reduced-fat, low-fat, or fat-free cheeses. Nonfat, low-sodium ricotta or cottage cheese. Low-fat or nonfat yogurt. Low-fat, low-sodium cheese. Fats and oils Soft margarine without trans fats. Vegetable oil. Reduced-fat, low-fat, or light mayonnaise and salad  dressings (reduced-sodium). Canola, safflower, olive, avocado, soybean, and sunflower oils. Avocado. Seasonings and condiments Herbs. Spices. Seasoning mixes without salt. Other foods Unsalted popcorn and pretzels. Fat-free sweets. The items listed above may not be all the foods and drinks you can have. Talk to a dietitian to learn more. What foods should I avoid? Fruits Canned fruit in a light or heavy syrup. Fried fruit. Fruit in cream or butter sauce. Vegetables Creamed or fried vegetables. Vegetables in a cheese sauce. Regular canned vegetables that are not marked as low-sodium or reduced-sodium. Regular canned tomato sauce and paste that are not marked as low-sodium or reduced-sodium. Regular tomato and vegetable juices that are not marked as low-sodium or reduced-sodium. Dene. Olives. Grains Baked goods made with fat, such as croissants, muffins, or some breads. Dry pasta or rice meal packs. Meats and other proteins Fatty cuts of meat. Ribs. Fried meat. [place]. Bologna, salami, and other precooked or cured meats, such as sausages or meat loaves, that are not lean and low in sodium. Fat from the back of a pig (fatback). Bratwurst. Salted nuts and seeds. Canned beans with added salt. Canned  or smoked fish. Whole eggs or egg yolks. Chicken or [place] with skin. Dairy Whole or 2% milk, cream, and half-and-half. Whole or full-fat cream cheese. Whole-fat or sweetened yogurt. Full-fat cheese. Nondairy creamers. Whipped toppings. Processed cheese and cheese spreads. Fats and oils Butter. Stick margarine. Lard. Shortening. Ghee. Bacon fat. Tropical oils, such as coconut, palm kernel, or palm oil. Seasonings and condiments Onion salt, garlic salt, seasoned salt, table salt, and sea salt. Worcestershire sauce. Tartar sauce. Barbecue sauce. Teriyaki sauce. Soy sauce, including reduced-sodium soy sauce. Steak sauce. Canned and packaged gravies. Fish sauce. Oyster sauce. Cocktail sauce. Store-bought  horseradish. Ketchup. Mustard. Meat flavorings and tenderizers. Bouillon cubes. Hot sauces. Pre-made or packaged marinades. Pre-made or packaged taco seasonings. Relishes. Regular salad dressings. Other foods Salted popcorn and pretzels. The items listed above may not be all the foods and drinks you should avoid. Talk to a dietitian to learn more. Where to find more information National Heart, Lung, and Blood Institute (NHLBI): BuffaloDryCleaner.gl American Heart Association (AHA): heart.org Academy of Nutrition and Dietetics: eatright.org National Kidney Foundation (NKF): kidney.org This information is not intended to replace advice given to you by your health care provider. Make sure you discuss any questions you have with your health care provider. Document Revised: 04/19/2022 Document Reviewed: 04/19/2022 Elsevier Patient Education  2024 [employer].

## 2024-02-18 ENCOUNTER — Encounter: Payer: Self-pay | Admitting: Nurse Practitioner

## 2024-02-18 ENCOUNTER — Ambulatory Visit: Admitting: Nurse Practitioner

## 2024-02-18 VITALS — BP 143/94 | HR 73 | Temp 98.0°F | Ht 74.0 in | Wt 226.1 lb

## 2024-02-18 DIAGNOSIS — M5441 Lumbago with sciatica, right side: Secondary | ICD-10-CM

## 2024-02-18 DIAGNOSIS — G8929 Other chronic pain: Secondary | ICD-10-CM | POA: Diagnosis not present

## 2024-02-18 DIAGNOSIS — M5442 Lumbago with sciatica, left side: Secondary | ICD-10-CM

## 2024-02-18 DIAGNOSIS — I10 Essential (primary) hypertension: Secondary | ICD-10-CM

## 2024-02-18 MED ORDER — OXYCODONE HCL 15 MG PO TABS: 15.0000 mg | ORAL_TABLET | Freq: Three times a day (TID) | ORAL | 0 refills | Status: DC | PRN

## 2024-02-18 MED ORDER — AMLODIPINE BESYLATE 10 MG PO TABS: 10.0000 mg | ORAL_TABLET | Freq: Every day | ORAL | 4 refills | Status: AC

## 2024-02-18 MED ORDER — ROSUVASTATIN CALCIUM 20 MG PO TABS: 20.0000 mg | ORAL_TABLET | Freq: Every day | ORAL | 4 refills | Status: DC

## 2024-02-18 NOTE — Progress Notes (Signed)
 BP (!) 143/94   Pulse 73   Temp 98 F (36.7 C) (Oral)   Ht 6' 2 (1.88 m)   Wt 226 lb 2 oz (102.6 kg)   SpO2 97%   BMI 29.03 kg/m    Subjective:    Patient ID: Todd Craig, male    DOB: 01/14/1961, 63 y.o.   MRN: [id number]  HPI: Todd Craig is a 63 y.o. male  Chief Complaint  Patient presents with   Hypertension   His friend, Erminio, who attends all visits with him is present at bedside.  CHRONIC PAIN  Follow-up today.  Went to pain clinic in [place] ([facility]) and [place] without benefit Sherline). Recently sent referral to [facility] pain management in [place], but they report it was rejected. Completed physical therapy in past. Oxycodone  Ir 10 MG is his pain management (started by previous pain clinic) -- last fill 01/13/24. They are aware PCP does not perform chronic pain management. Placed referral last visit to pain management and ortho, appears to have been sent to [facility] in [place].  On Friday he sees Orthopedics and then following this he would go to pain provider, but they are discussing doing injections first. Need enough medicine to last until fully established. He is getting out every morning for walks. Present dose: as above 10 MG Q8H PRN  Morphine equivalents 45 Pain control status: exacerbated Duration: years Location: back pain (lower) Quality: dull, aching, and throbbing Current Pain Level: 8/10 Previous Pain Level: 10/10 Breakthrough pain: no Benefit from narcotic medications: yes What Activities task can be accomplished with current medication? Daily ADLs Interested in weaning off narcotics:no   Stool softners/OTC fiber: yes  Previous pain specialty evaluation: yes Non-narcotic analgesic meds: yes Narcotic contract: with pain management  HYPERTENSION / HYPERLIPIDEMIA Increased Valsartan  to 320 MG daily last visit and continued Chlorthalidone  25 MG daily. Has missed Amlodipine  and Crestor  doses for months. Continues to smoke, but  is cutting back.  One pack lasts more than one day. Satisfied with current treatment? yes Duration of hypertension: chronic BP monitoring frequency: not recently BP range:  BP medication side effects: no Duration of hyperlipidemia: chronic Cholesterol medication side effects: no Cholesterol supplements: none Medication compliance: good compliance Aspirin: no Recent stressors: no Recurrent headaches: no Visual changes: no Palpitations: no Dyspnea: no Chest pain: no Lower extremity edema: no Dizzy/lightheaded: no   Relevant past medical, surgical, family and social history reviewed and updated as indicated. Interim medical history since our last visit reviewed. Allergies and medications reviewed and updated.  Review of Systems  Constitutional:  Negative for activity change, diaphoresis, fatigue and fever.  Respiratory:  Negative for cough, chest tightness, shortness of breath and wheezing.   Cardiovascular:  Negative for chest pain, palpitations and leg swelling.  Gastrointestinal: Negative.   Musculoskeletal:  Positive for back pain.  Neurological: Negative.   Psychiatric/Behavioral: Negative.      Per HPI unless specifically indicated above     Objective:    BP (!) 143/94   Pulse 73   Temp 98 F (36.7 C) (Oral)   Ht 6' 2 (1.88 m)   Wt 226 lb 2 oz (102.6 kg)   SpO2 97%   BMI 29.03 kg/m   Wt Readings from Last 3 Encounters:  02/18/24 226 lb 2 oz (102.6 kg)  01/13/24 228 lb 3.2 oz (103.5 kg)  12/12/23 225 lb 12.8 oz (102.4 kg)    Physical Exam Vitals and nursing note reviewed.  Constitutional:  General: He is awake. He is not in acute distress.    Appearance: He is well-developed and well-groomed. He is not ill-appearing or toxic-appearing.  HENT:     Head: Normocephalic.     Right Ear: Hearing and external ear normal.     Left Ear: Hearing and external ear normal.  Eyes:     General: Lids are normal.     Extraocular Movements: Extraocular movements  intact.     Conjunctiva/sclera: Conjunctivae normal.  Neck:     Thyroid : No thyromegaly.     Vascular: No carotid bruit.  Cardiovascular:     Rate and Rhythm: Normal rate and regular rhythm.     Heart sounds: Normal heart sounds. No murmur heard.    No gallop.  Pulmonary:     Effort: No accessory muscle usage or respiratory distress.     Breath sounds: Normal breath sounds.  Abdominal:     General: Bowel sounds are normal. There is no distension.     Palpations: Abdomen is soft.     Tenderness: There is no abdominal tenderness.  Musculoskeletal:     Cervical back: Full passive range of motion without pain.     Lumbar back: Tenderness present. No swelling, edema or bony tenderness. Decreased range of motion.     Right lower leg: No edema.     Left lower leg: No edema.     Comments: Less antalgic gait noted today.  Lymphadenopathy:     Cervical: No cervical adenopathy.  Skin:    General: Skin is warm.     Capillary Refill: Capillary refill takes less than 2 seconds.  Neurological:     Mental Status: He is alert.     Cranial Nerves: Cranial nerves 2-12 are intact.     Motor: Motor function is intact.     Coordination: Coordination is intact.     Deep Tendon Reflexes: Reflexes are normal and symmetric.     Reflex Scores:      Brachioradialis reflexes are 2+ on the right side and 2+ on the left side.      Patellar reflexes are 2+ on the right side and 2+ on the left side. Psychiatric:        Attention and Perception: Attention normal.        Mood and Affect: Mood normal.        Speech: Speech normal.        Behavior: Behavior normal. Behavior is cooperative.        Thought Content: Thought content normal.    Results for orders placed or performed in visit on 01/13/24  Basic metabolic panel with GFR   Collection Time: 01/13/24 11:51 AM  Result Value Ref Range   Glucose 86 70 - 99 mg/dL   BUN 11 8 - 27 mg/dL   Creatinine, Ser 8.56 (H) 0.76 - 1.27 mg/dL   eGFR 55 (L) >40  fO/fpw/8.26   BUN/Creatinine Ratio 8 (L) 10 - 24   Sodium 141 134 - 144 mmol/L   Potassium 4.3 3.5 - 5.2 mmol/L   Chloride 103 96 - 106 mmol/L   CO2 22 20 - 29 mmol/L   Calcium  11.1 (H) 8.6 - 10.2 mg/dL      Assessment & Plan:   Problem List Items Addressed This Visit       Cardiovascular and Mediastinum   Primary hypertension - Primary   Chronic, ongoing.  BP is trending down, but not at goal.  Discussed with them that for stroke  prevention goal is BP <130/80.  Restart Amlodipine , suspect this will get BP to goal. Will continue Chlorthalidone  25 MG daily and Valsartan  320 MG daily.  Highly recommend he work towards smoking cessation. Avoid alcohol  use. Recommend he monitor BP at least a few mornings a week at home and document.  DASH diet at home.  Continue current medication regimen and adjust as needed.  Labs: recheck next visit.  Urine ALB 150 July 2025. - Recent K+ 4.3 and eGFR 55 - monitor K+ level closely.      Relevant Medications   amLODipine  (NORVASC ) 10 MG tablet   rosuvastatin  (CRESTOR ) 20 MG tablet     Nervous and Auditory   Chronic midline low back pain with bilateral sciatica   Chronic, ongoing. Has gone to 2 pain clinics and recent one they did not have good experience. Referral placed last visit for new pain clinic to takeover his Oxycodone  Ir -- they report having upcoming appointments for ortho and pain management to discuss injections.  Will send in 30 day supply at present to bridge him until pain clinic takes over or adjusts regimen, maintain at [facility] as reduced to at past visit to reduce total daily dose of opioid and see if ongoing benefit  Pain medication offers benefit to his daily life and ADLs.        Relevant Medications   oxyCODONE  (ROXICODONE ) 15 MG immediate release tablet    Follow up plan: Return in about 8 weeks (around 04/14/2024) for HTN/HLD and PAIN.

## 2024-02-18 NOTE — Assessment & Plan Note (Signed)
 Chronic, ongoing. Has gone to 2 pain clinics and recent one they did not have good experience. Referral placed last visit for new pain clinic to takeover his Oxycodone  Ir -- they report having upcoming appointments for ortho and pain management to discuss injections.  Will send in 30 day supply at present to bridge him until pain clinic takes over or adjusts regimen, maintain at [facility] as reduced to at past visit to reduce total daily dose of opioid and see if ongoing benefit  Pain medication offers benefit to his daily life and ADLs.

## 2024-02-18 NOTE — Assessment & Plan Note (Signed)
 Chronic, ongoing.  BP is trending down, but not at goal.  Discussed with them that for stroke prevention goal is BP <130/80.  Restart Amlodipine , suspect this will get BP to goal. Will continue Chlorthalidone  25 MG daily and Valsartan  320 MG daily.  Highly recommend he work towards smoking cessation. Avoid alcohol  use. Recommend he monitor BP at least a few mornings a week at home and document.  DASH diet at home.  Continue current medication regimen and adjust as needed.  Labs: recheck next visit.  Urine ALB 150 July 2025. - Recent K+ 4.3 and eGFR 55 - monitor K+ level closely.

## 2024-03-10 DIAGNOSIS — M5442 Lumbago with sciatica, left side: Secondary | ICD-10-CM | POA: Diagnosis not present

## 2024-03-10 DIAGNOSIS — M5441 Lumbago with sciatica, right side: Secondary | ICD-10-CM | POA: Diagnosis not present

## 2024-03-10 DIAGNOSIS — G8929 Other chronic pain: Secondary | ICD-10-CM | POA: Diagnosis not present

## 2024-03-10 DIAGNOSIS — M5416 Radiculopathy, lumbar region: Secondary | ICD-10-CM | POA: Diagnosis not present

## 2024-04-11 NOTE — Patient Instructions (Incomplete)

## 2024-04-14 ENCOUNTER — Other Ambulatory Visit (HOSPITAL_COMMUNITY): Payer: Self-pay

## 2024-04-14 ENCOUNTER — Telehealth: Payer: Self-pay

## 2024-04-14 ENCOUNTER — Ambulatory Visit: Admitting: Nurse Practitioner

## 2024-04-14 ENCOUNTER — Encounter: Payer: Self-pay | Admitting: Nurse Practitioner

## 2024-04-14 VITALS — BP 140/84 | HR 98 | Temp 99.4°F | Resp 17 | Ht 74.02 in | Wt 222.6 lb

## 2024-04-14 DIAGNOSIS — I10 Essential (primary) hypertension: Secondary | ICD-10-CM

## 2024-04-14 DIAGNOSIS — M5442 Lumbago with sciatica, left side: Secondary | ICD-10-CM | POA: Diagnosis not present

## 2024-04-14 DIAGNOSIS — Z8546 Personal history of malignant neoplasm of prostate: Secondary | ICD-10-CM

## 2024-04-14 DIAGNOSIS — G8929 Other chronic pain: Secondary | ICD-10-CM

## 2024-04-14 DIAGNOSIS — F17290 Nicotine dependence, other tobacco product, uncomplicated: Secondary | ICD-10-CM | POA: Diagnosis not present

## 2024-04-14 DIAGNOSIS — M5441 Lumbago with sciatica, right side: Secondary | ICD-10-CM

## 2024-04-14 DIAGNOSIS — F1090 Alcohol use, unspecified, uncomplicated: Secondary | ICD-10-CM | POA: Diagnosis not present

## 2024-04-14 DIAGNOSIS — R413 Other amnesia: Secondary | ICD-10-CM | POA: Diagnosis not present

## 2024-04-14 DIAGNOSIS — N1831 Chronic kidney disease, stage 3a: Secondary | ICD-10-CM | POA: Diagnosis not present

## 2024-04-14 DIAGNOSIS — E7800 Pure hypercholesterolemia, unspecified: Secondary | ICD-10-CM | POA: Diagnosis not present

## 2024-04-14 DIAGNOSIS — E212 Other hyperparathyroidism: Secondary | ICD-10-CM

## 2024-04-14 MED ORDER — OXYCODONE HCL 15 MG PO TABS: 15.0000 mg | ORAL_TABLET | Freq: Three times a day (TID) | ORAL | 0 refills | Status: DC | PRN

## 2024-04-14 MED ORDER — CHLORTHALIDONE 25 MG PO TABS: 25.0000 mg | ORAL_TABLET | Freq: Every day | ORAL | 3 refills | Status: AC

## 2024-04-14 MED ORDER — NEBIVOLOL HCL 5 MG PO TABS: 5.0000 mg | ORAL_TABLET | Freq: Every day | ORAL | 3 refills | Status: AC

## 2024-04-14 NOTE — Assessment & Plan Note (Signed)
 Ongoing, chronic.  Continue current medication regimen and adjust further next visit as recent LDL above goal. Goal less than 55 due to history of CVA.

## 2024-04-14 NOTE — Assessment & Plan Note (Signed)
 Chronic, ongoing.  BP is trending down, but not at goal.  Discussed with them that for stroke prevention goal is BP <130/80. Suspect his chronic pain does play a role in his BP elevations ongoing.  Continues Amlodipine , Valsartan , and Chlorthalidone  -- two at max doses. Will add on low dose of Bystolic 5 MG, his HR is in 90s.  Educated him and [place] on this medication. Cardioselective preferred as suspect some underlying lung disease. Highly recommend he work towards smoking cessation. Avoid alcohol  or drug use. Recommend he monitor BP at least a few mornings a week at home and document.  DASH diet at home. Labs: CMP.  Urine ALB 150 July 2025. - Will consider cardiology visit if ongoing elevations and poor control.

## 2024-04-14 NOTE — Progress Notes (Addendum)
 "  BP (!) 140/84 (BP Location: Left Arm, Patient Position: Sitting, Cuff Size: Normal)   Pulse 98   Temp 99.4 F (37.4 C) (Oral)   Resp 17   Ht 6' 2.02 (1.88 m)   Wt 222 lb 9.6 oz (101 kg)   SpO2 90%   BMI 28.57 kg/m    Subjective:    Patient ID: Todd Craig, male    DOB: Nov 11, 1960, 64 y.o.   MRN: [id number]  HPI: Todd Craig is a 63 y.o. male  Chief Complaint  Patient presents with   Follow-up    Follow up and to discuss pain they have not heard back from anyone yet due to waiting on clearance from insurance. Would like to get something if possible to help with pain.   His friend, Erminio, who attends all visits with him is present at bedside.  CHRONIC PAIN  Follow-up today for overall health. Has been to pain clinic in [facility] ([facility]) and [place] without benefit Sherline). Sent referral to [facility] pain management in [place], but this was denied due to coverage. Recent referral in September went to [facility] in [place]. Completed physical therapy in past. Oxycodone  Ir 10 MG is his pain management (started by previous pain clinic) -- last fill 02/18/24. They are aware PCP does not perform chronic pain management and will need to see pain management provider if ongoing oral medication needed. We have attempted to get into multiple clinics locally.  Saw physiatry on 03/10/24 with plan to perform epidural injection under sedation, they are awaiting insurance approval for this. They also discussed possible referral for surgery for spinal cord stimulator. Present dose: as above 10 MG Q8H PRN  Morphine equivalents 45 Pain control status: chronic Duration: years Location: back pain (lower) Quality: dull, aching, and throbbing Current Pain Level: 8/10 Previous Pain Level: 10/10 Breakthrough pain: no Benefit from narcotic medications: yes What Activities task can be accomplished with current medication? Daily ADLs Interested in weaning off narcotics:no   Stool  softners/OTC fiber: yes  Previous pain specialty evaluation: yes Non-narcotic analgesic meds: yes Narcotic contract: with pain management  HYPERTENSION / HYPERLIPIDEMIA To be taking Valsartan  to 320 MG daily, Chlorthalidone  25 MG daily and Amlodipine  10 MG daily. Crestor  for HLD. Continues to smoke, he is almost smoking 1 PPD. Denies any alcohol  or drug use at home. No increase in memory changes reported. Satisfied with current treatment? yes Duration of hypertension: chronic BP monitoring frequency: occasionally BP range: 150 something BP medication side effects: no Duration of hyperlipidemia: chronic Cholesterol medication side effects: no Cholesterol supplements: none Medication compliance: good compliance Aspirin: no Recent stressors: no Recurrent headaches: no Visual changes: no Palpitations: no Dyspnea: no Chest pain: no Lower extremity edema: no Dizzy/lightheaded: no     04/14/2024   10:06 AM 10/24/2023   11:55 AM  6CIT Screen  What Year? 4 points 4 points  What month? 0 points 0 points  What time? 0 points 3 points  Count back from 20 2 points 2 points  Months in reverse 4 points 4 points  Repeat phrase 10 points 10 points  Total Score 20 points 23 points   CHRONIC KIDNEY DISEASE (CKD 3a) Noted on past labs. Has history of prostate cancer with surgical removal 11/28/21. CKD status: stable Medications renally dose: yes Previous renal evaluation: no Pneumovax:  Not up to Date -- refuses Influenza Vaccine:  Not up to Date -- refuses  Relevant past medical, surgical, family and social history reviewed  and updated as indicated. Interim medical history since our last visit reviewed. Allergies and medications reviewed and updated.  Review of Systems  Constitutional:  Negative for activity change, diaphoresis, fatigue and fever.  Respiratory:  Negative for cough, chest tightness, shortness of breath and wheezing.   Cardiovascular:  Negative for chest pain,  palpitations and leg swelling.  Gastrointestinal: Negative.   Musculoskeletal:  Positive for back pain.  Neurological: Negative.   Psychiatric/Behavioral: Negative.      Per HPI unless specifically indicated above     Objective:    BP (!) 140/84 (BP Location: Left Arm, Patient Position: Sitting, Cuff Size: Normal)   Pulse 98   Temp 99.4 F (37.4 C) (Oral)   Resp 17   Ht 6' 2.02 (1.88 m)   Wt 222 lb 9.6 oz (101 kg)   SpO2 90%   BMI 28.57 kg/m   Wt Readings from Last 3 Encounters:  04/14/24 222 lb 9.6 oz (101 kg)  02/18/24 226 lb 2 oz (102.6 kg)  01/13/24 228 lb 3.2 oz (103.5 kg)    Physical Exam Vitals and nursing note reviewed.  Constitutional:      General: He is awake. He is not in acute distress.    Appearance: He is well-developed and well-groomed. He is not ill-appearing or toxic-appearing.  HENT:     Head: Normocephalic.     Right Ear: Hearing and external ear normal.     Left Ear: Hearing and external ear normal.  Eyes:     General: Lids are normal.     Extraocular Movements: Extraocular movements intact.     Conjunctiva/sclera: Conjunctivae normal.  Neck:     Thyroid : No thyromegaly.     Vascular: No carotid bruit.  Cardiovascular:     Rate and Rhythm: Normal rate and regular rhythm.     Heart sounds: Normal heart sounds. No murmur heard.    No gallop.  Pulmonary:     Effort: No accessory muscle usage or respiratory distress.     Breath sounds: Normal breath sounds.  Abdominal:     General: Bowel sounds are normal. There is no distension.     Palpations: Abdomen is soft.     Tenderness: There is no abdominal tenderness.  Musculoskeletal:     Cervical back: Full passive range of motion without pain.     Lumbar back: Tenderness present. No swelling, edema or bony tenderness. Decreased range of motion.     Right lower leg: No edema.     Left lower leg: No edema.     Comments: Antalgic gait present.  Lymphadenopathy:     Cervical: No cervical  adenopathy.  Skin:    General: Skin is warm.     Capillary Refill: Capillary refill takes less than 2 seconds.  Neurological:     Mental Status: He is alert.     Cranial Nerves: Cranial nerves 2-12 are intact.     Motor: Motor function is intact.     Coordination: Coordination is intact.     Deep Tendon Reflexes: Reflexes are normal and symmetric.     Reflex Scores:      Brachioradialis reflexes are 2+ on the right side and 2+ on the left side.      Patellar reflexes are 2+ on the right side and 2+ on the left side. Psychiatric:        Attention and Perception: Attention normal.        Mood and Affect: Mood normal.  Speech: Speech normal.        Behavior: Behavior normal. Behavior is cooperative.        Thought Content: Thought content normal.    Results for orders placed or performed in visit on 01/13/24  Basic metabolic panel with GFR   Collection Time: 01/13/24 11:51 AM  Result Value Ref Range   Glucose 86 70 - 99 mg/dL   BUN 11 8 - 27 mg/dL   Creatinine, Ser 8.56 (H) 0.76 - 1.27 mg/dL   eGFR 55 (L) >40 fO/fpw/8.26   BUN/Creatinine Ratio 8 (L) 10 - 24   Sodium 141 134 - 144 mmol/L   Potassium 4.3 3.5 - 5.2 mmol/L   Chloride 103 96 - 106 mmol/L   CO2 22 20 - 29 mmol/L   Calcium  11.1 (H) 8.6 - 10.2 mg/dL      Assessment & Plan:   Problem List Items Addressed This Visit       Cardiovascular and Mediastinum   Primary hypertension   Chronic, ongoing.  BP is trending down, but not at goal.  Discussed with them that for stroke prevention goal is BP <130/80. Suspect his chronic pain does play a role in his BP elevations ongoing.  Continues Amlodipine , Valsartan , and Chlorthalidone  -- two at max doses. Will add on low dose of Bystolic 5 MG, his HR is in 90s.  Educated him and [place] on this medication. Cardioselective preferred as suspect some underlying lung disease. Highly recommend he work towards smoking cessation. Avoid alcohol  or drug use. Recommend he monitor BP at  least a few mornings a week at home and document.  DASH diet at home. Labs: CMP.  Urine ALB 150 July 2025. - Will consider cardiology visit if ongoing elevations and poor control.      Relevant Medications   nebivolol (BYSTOLIC) 5 MG tablet   chlorthalidone  (HYGROTON ) 25 MG tablet     Nervous and Auditory   Chronic midline low back pain with bilateral sciatica   Chronic, ongoing. Has gone to 2 pain clinics without benefit and recent one they did not have good experience. Referral placed last visit for new pain clinic to takeover his Oxycodone  Ir, they have not called to schedule, provided his friend with number to call and if any issues to alert us .  Will send in 30 day supply at present to bridge him until pain clinic takes over or adjusts regimen, maintain at [facility] as reduced to at past visit to reduce total daily dose of opioid and see if ongoing benefit. Plan to further reduce in future if needs further refills. Pain medication offers benefit to his daily life and ADLs.  If needs further refills next visit will perform UDS prior to filling. - Have attempted to get him into multiple pain clinics without success due to coverage or because he takes oral medications. - Continue to collaborate with physiatry, suspect injection would offer benefit.  Suspect some of his elevation in BP is related to his pain.      Relevant Medications   oxyCODONE  (ROXICODONE ) 15 MG immediate release tablet   Other Relevant Orders   Ambulatory referral to Pain Clinic     Genitourinary   Stage 3a chronic kidney disease (HCC) - Primary   Chronic,stable CKD 3a. Current kidney failure risk at 5 years is 1.21% on calculation.  May benefit SGLT2 in future, which will discuss with them next visit as may benefit BP levels too. Avoid Ibuprofen  and contrast. Renal dose medications as needed.  Relevant Orders   Comprehensive metabolic panel with GFR     Other   History of prostate cancer   Performed 11/28/21.  Is to  be following with urology, continue this collaboration.  Recent note reviewed. PSA up to date.      Nicotine dependence due to vaping tobacco product   I have recommended complete cessation of tobacco use. I have discussed various options available for assistance with tobacco cessation including over the counter methods (Nicotine gum, patch and lozenges). We also discussed prescription options (Chantix, Nicotine Inhaler / Nasal Spray). The patient is not interested in pursuing any prescription tobacco cessation options at this time.  Lung cancer screening ordered, but has not attended.       Memory changes   Per Erminio this has been present awhile.  6CIT 20 and 23.  Poor recall and short term memory.  History of stroke, ?some vascular dementia. Recent imaging performed and no acute findings. His mother had dementia.  Scheduled to see neurology in April 2026.      Hypercholesteremia   Ongoing, chronic.  Continue current medication regimen and adjust further next visit as recent LDL above goal. Goal less than 55 due to history of CVA.      Relevant Medications   nebivolol (BYSTOLIC) 5 MG tablet   chlorthalidone  (HYGROTON ) 25 MG tablet   Other Relevant Orders   Comprehensive metabolic panel with GFR   Lipid Panel w/o Chol/HDL Ratio   Alcohol  use   Reports no current use, has been awhile since use of alcohol . Recommend continued cessation.       Follow up plan: Return in about 4 weeks (around 05/12/2024) for HTN and CHRONIC PAIN -- added on Bystolic.      "

## 2024-04-14 NOTE — Assessment & Plan Note (Signed)
 Reports no current use, has been awhile since use of alcohol . Recommend continued cessation.

## 2024-04-14 NOTE — Telephone Encounter (Signed)
 Pharmacy Patient Advocate Encounter   Received notification from Onbase that prior authorization for oxyCODONE  HCl 15MG  tablets is required/requested.   Insurance verification completed.   The patient is insured through [employer].   Per test claim: PA required; PA submitted to above mentioned insurance via Latent Key/confirmation #/[id number] Status is pending

## 2024-04-14 NOTE — Assessment & Plan Note (Signed)
 Chronic,stable CKD 3a. Current kidney failure risk at 5 years is 1.21% on calculation.  May benefit SGLT2 in future, which will discuss with them next visit as may benefit BP levels too. Avoid Ibuprofen  and contrast. Renal dose medications as needed.

## 2024-04-14 NOTE — Assessment & Plan Note (Signed)
 I have recommended complete cessation of tobacco use. I have discussed various options available for assistance with tobacco cessation including over the counter methods (Nicotine gum, patch and lozenges). We also discussed prescription options (Chantix, Nicotine Inhaler / Nasal Spray). The patient is not interested in pursuing any prescription tobacco cessation options at this time.  Lung cancer screening ordered, but has not attended.

## 2024-04-14 NOTE — Telephone Encounter (Signed)
 Forwarding to PA team for further assiatnce

## 2024-04-14 NOTE — Assessment & Plan Note (Addendum)
 Chronic, ongoing. Has gone to 2 pain clinics without benefit and recent one they did not have good experience. Referral placed last visit for new pain clinic to takeover his Oxycodone  Ir, they have not called to schedule, provided his friend with number to call and if any issues to alert us .  Will send in 30 day supply at present to bridge him until pain clinic takes over or adjusts regimen, maintain at [facility] as reduced to at past visit to reduce total daily dose of opioid and see if ongoing benefit. Plan to further reduce in future if needs further refills. Pain medication offers benefit to his daily life and ADLs.  If needs further refills next visit will perform UDS prior to filling. - Have attempted to get him into multiple pain clinics without success due to coverage or because he takes oral medications. - Continue to collaborate with physiatry, suspect injection would offer benefit.  Suspect some of his elevation in BP is related to his pain.

## 2024-04-14 NOTE — Assessment & Plan Note (Addendum)
 Performed 11/28/21.  Is to be following with urology, continue this collaboration.  Recent note reviewed. PSA up to date.

## 2024-04-14 NOTE — Telephone Encounter (Signed)
 Copied from CRM #[id number]. Topic: Clinical - Medication Prior Auth >> Apr 14, 2024 10:54 AM Everette C wrote: Reason for CRM: The patient's significant other has been directed by their pharmacy to contact their PCP and request prior authorization for their prescription for oxyCODONE  (ROXICODONE ) 15 MG immediate release tablet [[id number]]   Please contact further if needed

## 2024-04-14 NOTE — Assessment & Plan Note (Addendum)
 Per Erminio this has been present awhile.  6CIT 20 and 23.  Poor recall and short term memory.  History of stroke, ?some vascular dementia. Recent imaging performed and no acute findings. His mother had dementia.  Scheduled to see neurology in April 2026.

## 2024-04-15 ENCOUNTER — Ambulatory Visit: Payer: Self-pay | Admitting: Nurse Practitioner

## 2024-04-15 ENCOUNTER — Other Ambulatory Visit (HOSPITAL_COMMUNITY): Payer: Self-pay

## 2024-04-15 LAB — LIPID PANEL W/O CHOL/HDL RATIO
Cholesterol, Total: 151 mg/dL (ref 100–199)
HDL: 26 mg/dL — ABNORMAL LOW
LDL Chol Calc (NIH): 92 mg/dL (ref 0–99)
Triglycerides: 189 mg/dL — ABNORMAL HIGH (ref 0–149)
VLDL Cholesterol Cal: 33 mg/dL (ref 5–40)

## 2024-04-15 LAB — COMPREHENSIVE METABOLIC PANEL WITH GFR
ALT: 12 IU/L (ref 0–44)
AST: 20 IU/L (ref 0–40)
Albumin: 4.1 g/dL (ref 3.9–4.9)
Alkaline Phosphatase: 82 IU/L (ref 47–123)
BUN/Creatinine Ratio: 12 (ref 10–24)
BUN: 22 mg/dL (ref 8–27)
Bilirubin Total: 0.7 mg/dL (ref 0.0–1.2)
CO2: 23 mmol/L (ref 20–29)
Calcium: 9.3 mg/dL (ref 8.6–10.2)
Chloride: 102 mmol/L (ref 96–106)
Creatinine, Ser: 1.88 mg/dL — ABNORMAL HIGH (ref 0.76–1.27)
Globulin, Total: 2.7 g/dL (ref 1.5–4.5)
Glucose: 126 mg/dL — ABNORMAL HIGH (ref 70–99)
Potassium: 3.5 mmol/L (ref 3.5–5.2)
Sodium: 141 mmol/L (ref 134–144)
Total Protein: 6.8 g/dL (ref 6.0–8.5)
eGFR: 40 mL/min/1.73 — ABNORMAL LOW

## 2024-04-15 MED ORDER — ROSUVASTATIN CALCIUM 40 MG PO TABS: 40.0000 mg | ORAL_TABLET | Freq: Every day | ORAL | 3 refills | Status: AC

## 2024-04-15 NOTE — Telephone Encounter (Signed)
 Patient's wife Erminio has been notified. She is listed.

## 2024-04-15 NOTE — Progress Notes (Signed)
 Good morning please let Todd Craig know his labs have returned: - Lipid panel still needs some work to get levels to goal. I recommend we increase Rosuvastatin  to 40  daily and stop 20 MG dosing. I will send this in. Then we will recheck next visit. - Kidney function continues to show kidney disease present with some decline this check. I need you to schedule to return to kidney doctor as soon as possible for follow-up. Liver function is normal. - Glucose, sugar, was a bit elevated this check. We will recheck diabetes testing next visit, last check showed no diabetes or prediabetes. Any questions? Keep being amazing!!  Thank you for allowing me to participate in your care.  I appreciate you. Kindest regards, Celia Gibbons

## 2024-04-15 NOTE — Telephone Encounter (Signed)
 Pharmacy Patient Advocate Encounter  Received notification from [employer] that Prior Authorization for oxyCODONE  HCl 15MG  tablets  has been APPROVED from 04/14/24 to 10/11/24   PA #/Case ID/Reference #: [id number]

## 2024-04-17 NOTE — Telephone Encounter (Signed)
 See other encounter, medication was approved.

## 2024-04-27 ENCOUNTER — Telehealth: Payer: Self-pay | Admitting: Nurse Practitioner

## 2024-04-27 NOTE — Telephone Encounter (Signed)
 Referral team- can this be re-faxed as requested please?

## 2024-04-27 NOTE — Telephone Encounter (Unsigned)
 Copied from CRM #[id number]. Topic: Referral - Status >> Apr 27, 2024 11:14 AM Victoria B wrote: Reason for CRM: Patients friend called in states [facility] doesn't have the referral for patient for pain management, but referral show sin system. She gave me the fx of [facility] to refax [phone]

## 2024-04-30 NOTE — Telephone Encounter (Unsigned)
 Copied from CRM #[id number]. Topic: Referral - Status >> Apr 27, 2024 11:14 AM Victoria B wrote: Reason for CRM: Patients friend called in states [facility] doesn't have the referral for patient for pain management, but referral show sin system. She gave me the fx of [facility] to refax [phone] >> Apr 30, 2024  2:46 PM China J wrote: Erminio calling on behalf of the patient to let [name] know that [facility] still has not received the referral. She would like if we could physical fax the referral to them instead. Fax: [phone]  Please call Brenda at [phone]

## 2024-05-06 ENCOUNTER — Telehealth: Payer: Self-pay | Admitting: Nurse Practitioner

## 2024-05-06 NOTE — Telephone Encounter (Signed)
 Copied from CRM #[id number]. Topic: Referral - Question >> May 06, 2024  9:22 AM Willma SAUNDERS wrote: Reason for CRM: Erminio states she called [facility] in regards to the patients referral for Pain Clinic. The person she talked to advised a note was sent back to his PCP and would need to reach out to us  to see what it stated since she is not on their documents to provide info. Patient would like a callback to discuss.   Erminio can be reached at [phone]

## 2024-05-06 NOTE — Telephone Encounter (Signed)
 error

## 2024-05-06 NOTE — Telephone Encounter (Signed)
 Fax from [facility] states It looks like the patient is being referred for pain medication management. Our practice focuses on non-opioid and procedure options for pain management. Unfortunately our providers will not be taking over or prescribing and opioids.  Can the patient be referred elsewhere?

## 2024-05-09 NOTE — Patient Instructions (Incomplete)

## 2024-05-13 ENCOUNTER — Ambulatory Visit: Admitting: Nurse Practitioner

## 2024-05-13 DIAGNOSIS — F1090 Alcohol use, unspecified, uncomplicated: Secondary | ICD-10-CM

## 2024-05-13 DIAGNOSIS — E7800 Pure hypercholesterolemia, unspecified: Secondary | ICD-10-CM

## 2024-05-13 DIAGNOSIS — E212 Other hyperparathyroidism: Secondary | ICD-10-CM

## 2024-05-13 DIAGNOSIS — N1831 Chronic kidney disease, stage 3a: Secondary | ICD-10-CM

## 2024-05-13 DIAGNOSIS — R801 Persistent proteinuria, unspecified: Secondary | ICD-10-CM

## 2024-05-13 DIAGNOSIS — Z8673 Personal history of transient ischemic attack (TIA), and cerebral infarction without residual deficits: Secondary | ICD-10-CM

## 2024-05-13 DIAGNOSIS — I10 Essential (primary) hypertension: Secondary | ICD-10-CM

## 2024-05-13 DIAGNOSIS — R7309 Other abnormal glucose: Secondary | ICD-10-CM

## 2024-05-13 DIAGNOSIS — E663 Overweight: Secondary | ICD-10-CM

## 2024-05-13 DIAGNOSIS — G8929 Other chronic pain: Secondary | ICD-10-CM

## 2024-05-13 DIAGNOSIS — F17290 Nicotine dependence, other tobacco product, uncomplicated: Secondary | ICD-10-CM

## 2024-05-15 ENCOUNTER — Encounter: Payer: Self-pay | Admitting: Nurse Practitioner

## 2024-05-15 ENCOUNTER — Ambulatory Visit: Admitting: Nurse Practitioner

## 2024-05-15 VITALS — BP 119/66 | HR 61 | Temp 99.5°F | Ht 74.2 in | Wt 222.6 lb

## 2024-05-15 DIAGNOSIS — G8929 Other chronic pain: Secondary | ICD-10-CM

## 2024-05-15 DIAGNOSIS — N1831 Chronic kidney disease, stage 3a: Secondary | ICD-10-CM

## 2024-05-15 DIAGNOSIS — M5442 Lumbago with sciatica, left side: Secondary | ICD-10-CM | POA: Diagnosis not present

## 2024-05-15 DIAGNOSIS — M5416 Radiculopathy, lumbar region: Secondary | ICD-10-CM | POA: Insufficient documentation

## 2024-05-15 DIAGNOSIS — F17290 Nicotine dependence, other tobacco product, uncomplicated: Secondary | ICD-10-CM

## 2024-05-15 DIAGNOSIS — E7800 Pure hypercholesterolemia, unspecified: Secondary | ICD-10-CM | POA: Diagnosis not present

## 2024-05-15 DIAGNOSIS — I10 Essential (primary) hypertension: Secondary | ICD-10-CM | POA: Diagnosis not present

## 2024-05-15 DIAGNOSIS — M5441 Lumbago with sciatica, right side: Secondary | ICD-10-CM | POA: Diagnosis not present

## 2024-05-15 MED ORDER — OXYCODONE HCL 15 MG PO TABS: 15.0000 mg | ORAL_TABLET | Freq: Three times a day (TID) | ORAL | 0 refills | Status: AC | PRN

## 2024-05-15 NOTE — Patient Instructions (Signed)
 Long-Term (Chronic) Back Pain: What It Means Chronic back pain is back pain that lasts longer than 3 months. The pain may get worse at certain times (flare-ups). There are things you can do at home to manage your pain. Follow these instructions at home: Watch for any changes in your symptoms. Take these actions to help with your pain: Managing pain and stiffness     If told, put ice on the painful area. You may be told to use ice for 24-48 hours after a flare-up starts. Put ice in a plastic bag. Place a towel between your skin and the bag. Leave the ice on for 20 minutes, 2-3 times a day. If told, put heat on the painful area. Do this as often as told by your doctor. Use the heat source that your doctor recommends, such as a moist heat pack or a heating pad. Place a towel between your skin and the heat source. Leave the heat on for 20-30 minutes. If your skin turns bright red, take off the ice or heat right away to prevent skin damage. The risk of damage is higher if you cannot feel pain, heat, or cold. Soak in a warm bath. This can help with pain. Activity        Avoid bending and other activities that make the pain worse. When you stand: Keep your upper back and neck straight. Keep your shoulders pulled back. Avoid slouching. When you sit: Keep your back straight. Relax your shoulders. Do not round your shoulders or pull them backward. Do not sit or stand in one place for too long. Take short rest breaks during the day. Lying down or standing is often better than sitting. Resting can help relieve pain. When sitting or lying down for a long time, do some mild activity or stretching. This will help to prevent stiffness and pain. Get regular exercise. Ask your doctor what activities are safe for you. You may have to avoid lifting. Ask your provider how much you can safely lift. If you lift things: Bend your knees. Keep the weight close to your body. Avoid  twisting. Medicines Take over-the-counter and prescription medicines only as told by your doctor. You may need to take medicines for pain and swelling. These may be taken by mouth or put on the skin. You may also be given muscle relaxants. Ask your doctor if the medicine prescribed to you: Requires you to avoid driving or using machinery. Can cause trouble pooping (constipation). You may need to take these actions to prevent or treat trouble pooping: Drink enough fluid to keep your pee (urine) pale yellow. Take over-the-counter or prescription medicines. Eat foods that are high in fiber. These include beans, whole grains, and fresh fruits and vegetables. Limit foods that are high in fat and sugars. These include fried or sweet foods. General instructions  Sleep on a firm mattress. Try lying on your side with your knees slightly bent. If you lie on your back, put a pillow under your knees. Do not smoke or use any products that contain nicotine or tobacco. If you need help quitting, ask your doctor. Contact a doctor if: Your pain does not get better with rest or medicine. You have new pain. You have a fever. You lose weight quickly. You have trouble doing your normal activities. One or both of your legs or feet feel weak. One or both of your legs or feet lose feeling (have numbness). Get help right away if: You are not able to  control when you pee or poop. You have bad back pain and: You feel like you may vomit (nauseous). You vomit. You have pain in your chest or your belly (abdomen). You have shortness of breath. You faint. These symptoms may be an emergency. Get help right away. Call 911. Do not wait to see if the symptoms will go away. Do not drive yourself to the hospital. This information is not intended to replace advice given to you by your health care provider. Make sure you discuss any questions you have with your health care provider. Document Revised: 02/07/2024 Document  Reviewed: 11/20/2021 Elsevier Patient Education  2025 [employer].

## 2024-05-15 NOTE — Assessment & Plan Note (Signed)
 I have recommended complete cessation of tobacco use. I have discussed various options available for assistance with tobacco cessation including over the counter methods (Nicotine gum, patch and lozenges). We also discussed prescription options (Chantix, Nicotine Inhaler / Nasal Spray). The patient is not interested in pursuing any prescription tobacco cessation options at this time.  Lung cancer screening ordered, but has not attended.

## 2024-05-15 NOTE — Assessment & Plan Note (Signed)
 Chronic, ongoing.  BP much improved today.  Discussed with them that for stroke prevention goal is BP <130/80. Suspect his chronic pain does play a role in his BP elevations ongoing.  Continue Amlodipine , Valsartan , and Chlorthalidone  -- two at max doses. Continue low dose of Bystolic  5 MG, his HR is tolerating at this time.  Educated him and [place] on this medication. Cardioselective preferred as suspect some underlying lung disease. Highly recommend he work towards smoking cessation. Avoid alcohol  or drug use. Recommend he monitor BP at least a few mornings a week at home and document.  DASH diet at home. Labs: up to date.  Urine ALB 150 July 2025. - Will consider cardiology visit if ongoing elevations and poor control.

## 2024-05-15 NOTE — Progress Notes (Signed)
 "  BP 119/66   Pulse 61   Temp 99.5 F (37.5 C) (Oral)   Ht 6' 2.2 (1.885 m)   Wt 222 lb 9.6 oz (101 kg)   SpO2 94%   BMI 28.43 kg/m    Subjective:    Patient ID: Todd Craig, male    DOB: 1960-09-26, 64 y.o.   MRN: [id number]  HPI: Todd Craig is a 64 y.o. male  Chief Complaint  Patient presents with   Hypertension   Pain   His friend, Erminio, who attends all visits with him is present at bedside.  CHRONIC PAIN  Follow-up today for chronic pain. We have attempted to get him into a new pain clinic without success as of yet.  Has been to pain clinic in [facility] ([facility]) and [facility]). Sent referral to [facility] pain management in [place], but this was denied due to coverage. Recent referral in September went to [facility] in [place], but they do not takeover oral medications, which he has been on since initial pain clinic, [facility]. [facility] also does not takeover oral medications. Completed physical therapy in past. Oxycodone  Ir 10 MG is his pain management (started by previous pain clinic) -- last fill 04/15/24. They are aware PCP does not perform chronic pain management and will need to see pain management provider if ongoing oral medication needed, which we have been trying to find.  Had initial visit with physiatry on 03/10/24 with plan to perform epidural injection under sedation, they are awaiting insurance approval. They also discussed possible referral for surgery for spinal cord stimulator. Present dose: as above 10 MG Q8H PRN  (this is reduction from initial) Morphine equivalents 45 Pain control status: chronic Duration: years Location: back pain (lower) Quality: dull, aching, and throbbing Current Pain Level: 5/10 Previous Pain Level: 10/10 Breakthrough pain: no Benefit from narcotic medications: yes What Activities task can be accomplished with current medication? Daily ADLs Interested in weaning off narcotics:no   Stool softners/OTC fiber: yes   Previous pain specialty evaluation: yes Non-narcotic analgesic meds: yes Narcotic contract: with pain management  HYPERTENSION / HYPERLIPIDEMIA Takes Valsartan  320 MG daily, Chlorthalidone  25 MG daily, Bystolic  5 MG daily (started last visit), and Amlodipine  10 MG daily. Crestor  for HLD. Continues to smoke, 1 PPD. Denies any alcohol  or drug use at home. No increase in memory changes reported. Satisfied with current treatment? yes Duration of hypertension: chronic BP monitoring frequency: occasionally BP range: 150 something BP medication side effects: no Duration of hyperlipidemia: chronic Cholesterol medication side effects: no Cholesterol supplements: none Medication compliance: good compliance Aspirin: no Recent stressors: no Recurrent headaches: no Visual changes: no Palpitations: no Dyspnea: no Chest pain: no Lower extremity edema: no Dizzy/lightheaded: no     04/14/2024   10:06 AM 10/24/2023   11:55 AM  6CIT Screen  What Year? 4 points 4 points  What month? 0 points 0 points  What time? 0 points 3 points  Count back from 20 2 points 2 points  Months in reverse 4 points 4 points  Repeat phrase 10 points 10 points  Total Score 20 points 23 points   CHRONIC KIDNEY DISEASE (CKD 3a) Scheduled to see nephrology. Has history of prostate cancer with surgical removal 11/28/21. CKD status: stable Medications renally dose: yes Previous renal evaluation: no Pneumovax:  Not up to Date -- refuses Influenza Vaccine:  Not up to Date -- refuses  Relevant past medical, surgical, family and social history reviewed and updated as indicated.  Interim medical history since our last visit reviewed. Allergies and medications reviewed and updated.  Review of Systems  Constitutional:  Negative for activity change, diaphoresis, fatigue and fever.  Respiratory:  Negative for cough, chest tightness, shortness of breath and wheezing.   Cardiovascular:  Negative for chest pain, palpitations  and leg swelling.  Gastrointestinal: Negative.   Musculoskeletal:  Positive for back pain.  Neurological: Negative.   Psychiatric/Behavioral: Negative.      Per HPI unless specifically indicated above     Objective:    BP 119/66   Pulse 61   Temp 99.5 F (37.5 C) (Oral)   Ht 6' 2.2 (1.885 m)   Wt 222 lb 9.6 oz (101 kg)   SpO2 94%   BMI 28.43 kg/m   Wt Readings from Last 3 Encounters:  05/15/24 222 lb 9.6 oz (101 kg)  04/14/24 222 lb 9.6 oz (101 kg)  02/18/24 226 lb 2 oz (102.6 kg)    Physical Exam Vitals and nursing note reviewed.  Constitutional:      General: He is awake. He is not in acute distress.    Appearance: He is well-developed and well-groomed. He is not ill-appearing or toxic-appearing.  HENT:     Head: Normocephalic.     Right Ear: Hearing and external ear normal.     Left Ear: Hearing and external ear normal.  Eyes:     General: Lids are normal.     Extraocular Movements: Extraocular movements intact.     Conjunctiva/sclera: Conjunctivae normal.  Neck:     Thyroid : No thyromegaly.     Vascular: No carotid bruit.  Cardiovascular:     Rate and Rhythm: Normal rate and regular rhythm.     Heart sounds: Normal heart sounds. No murmur heard.    No gallop.  Pulmonary:     Effort: No accessory muscle usage or respiratory distress.     Breath sounds: Normal breath sounds.  Abdominal:     General: Bowel sounds are normal. There is no distension.     Palpations: Abdomen is soft.     Tenderness: There is no abdominal tenderness.  Musculoskeletal:     Cervical back: Full passive range of motion without pain.     Lumbar back: Tenderness present. No swelling, edema or bony tenderness. Decreased range of motion.     Right lower leg: No edema.     Left lower leg: No edema.     Comments: Antalgic gait present.  Lymphadenopathy:     Cervical: No cervical adenopathy.  Skin:    General: Skin is warm.     Capillary Refill: Capillary refill takes less than 2  seconds.  Neurological:     Mental Status: He is alert.     Cranial Nerves: Cranial nerves 2-12 are intact.     Motor: Motor function is intact.     Coordination: Coordination is intact.     Deep Tendon Reflexes: Reflexes are normal and symmetric.     Reflex Scores:      Brachioradialis reflexes are 2+ on the right side and 2+ on the left side.      Patellar reflexes are 2+ on the right side and 2+ on the left side. Psychiatric:        Attention and Perception: Attention normal.        Mood and Affect: Mood normal.        Speech: Speech normal.        Behavior: Behavior normal. Behavior is cooperative.  Thought Content: Thought content normal.    Results for orders placed or performed in visit on 04/14/24  Comprehensive metabolic panel with GFR   Collection Time: 04/14/24  9:31 AM  Result Value Ref Range   Glucose 126 (H) 70 - 99 mg/dL   BUN 22 8 - 27 mg/dL   Creatinine, Ser 8.11 (H) 0.76 - 1.27 mg/dL   eGFR 40 (L) >40 fO/fpw/8.26   BUN/Creatinine Ratio 12 10 - 24   Sodium 141 134 - 144 mmol/L   Potassium 3.5 3.5 - 5.2 mmol/L   Chloride 102 96 - 106 mmol/L   CO2 23 20 - 29 mmol/L   Calcium  9.3 8.6 - 10.2 mg/dL   Total Protein 6.8 6.0 - 8.5 g/dL   Albumin 4.1 3.9 - 4.9 g/dL   Globulin, Total 2.7 1.5 - 4.5 g/dL   Bilirubin Total 0.7 0.0 - 1.2 mg/dL   Alkaline Phosphatase 82 47 - 123 IU/L   AST 20 0 - 40 IU/L   ALT 12 0 - 44 IU/L  Lipid Panel w/o Chol/HDL Ratio   Collection Time: 04/14/24  9:31 AM  Result Value Ref Range   Cholesterol, Total 151 100 - 199 mg/dL   Triglycerides 810 (H) 0 - 149 mg/dL   HDL 26 (L) >60 mg/dL   VLDL Cholesterol Cal 33 5 - 40 mg/dL   LDL Chol Calc (NIH) 92 0 - 99 mg/dL      Assessment & Plan:   Problem List Items Addressed This Visit       Cardiovascular and Mediastinum   Primary hypertension   Chronic, ongoing.  BP much improved today.  Discussed with them that for stroke prevention goal is BP <130/80. Suspect his chronic pain  does play a role in his BP elevations ongoing.  Continue Amlodipine , Valsartan , and Chlorthalidone  -- two at max doses. Continue low dose of Bystolic  5 MG, his HR is tolerating at this time.  Educated him and [place] on this medication. Cardioselective preferred as suspect some underlying lung disease. Highly recommend he work towards smoking cessation. Avoid alcohol  or drug use. Recommend he monitor BP at least a few mornings a week at home and document.  DASH diet at home. Labs: up to date.  Urine ALB 150 July 2025. - Will consider cardiology visit if ongoing elevations and poor control.        Nervous and Auditory   Chronic midline low back pain with bilateral sciatica   Chronic, ongoing. Has gone to 2 pain clinics, [facility] initially started him on medication but he had a positive drug screen with them and was discharged. However, since that time Erminio has monitored him closely and he has not used, he himself denies any drug use. He is agreeable to UDS today and aware that if positive we will need to slowly reduce off pain medication. Will send in 30 day supply at present to bridge him until pain clinic takes over or adjusts regimen, maintain at [facility] as reduced to at past visit to reduce total daily dose of opioid and see if ongoing benefit. Plan to further reduce in future if needs further refills. Pain medication offers benefit to his daily life and ADLs.  Will see if can get him back into [place] since we have been unable to find a pain clinic that will takeover oral medications. - Have attempted to get him into multiple pain clinics without success due to coverage or because he takes oral medications. - Continue to collaborate  with physiatry, suspect injection would offer benefit.  Suspect some of his elevation in BP is related to his pain.      Relevant Medications   oxyCODONE  (ROXICODONE ) 15 MG immediate release tablet   Other Relevant Orders   Ambulatory referral to Pain Clinic   [phone]  11+Oxyco+Alc+Crt-Bund     Genitourinary   Stage 3a chronic kidney disease (HCC) - Primary   Chronic,stable CKD 3a. Current kidney failure risk at 5 years is 1.21% on last calculation.  May benefit SGLT2 in future, which will discuss with them next visit as may benefit BP levels too. Avoid Ibuprofen  and contrast. Renal dose medications as needed.        Other   Nicotine dependence due to vaping tobacco product   I have recommended complete cessation of tobacco use. I have discussed various options available for assistance with tobacco cessation including over the counter methods (Nicotine gum, patch and lozenges). We also discussed prescription options (Chantix, Nicotine Inhaler / Nasal Spray). The patient is not interested in pursuing any prescription tobacco cessation options at this time.  Lung cancer screening ordered, but has not attended.       Hypercholesteremia   Ongoing, chronic.  Continue current medication regimen and adjust further as needed. Goal less than 55 due to history of CVA.      I personally spent a total of 25 minutes in the care of the patient today including preparing to see the patient, getting/reviewing separately obtained history, performing a medically appropriate exam/evaluation, counseling and educating, placing orders, and coordinating care.   Follow up plan: Return in about 4 weeks (around 06/12/2024) for CHRONIC PAIN.      "

## 2024-05-15 NOTE — Assessment & Plan Note (Signed)
 Chronic, ongoing. Has gone to 2 pain clinics, [facility] initially started him on medication but he had a positive drug screen with them and was discharged. However, since that time Todd Craig has monitored him closely and he has not used, he himself denies any drug use. He is agreeable to UDS today and aware that if positive we will need to slowly reduce off pain medication. Will send in 30 day supply at present to bridge him until pain clinic takes over or adjusts regimen, maintain at [facility] as reduced to at past visit to reduce total daily dose of opioid and see if ongoing benefit. Plan to further reduce in future if needs further refills. Pain medication offers benefit to his daily life and ADLs.  Will see if can get him back into [place] since we have been unable to find a pain clinic that will takeover oral medications. - Have attempted to get him into multiple pain clinics without success due to coverage or because he takes oral medications. - Continue to collaborate with physiatry, suspect injection would offer benefit.  Suspect some of his elevation in BP is related to his pain.

## 2024-05-15 NOTE — Assessment & Plan Note (Signed)
 Ongoing, chronic.  Continue current medication regimen and adjust further as needed. Goal less than 55 due to history of CVA.

## 2024-05-19 ENCOUNTER — Ambulatory Visit: Payer: Self-pay | Admitting: Nurse Practitioner

## 2024-05-19 LAB — DRUG SCREEN 764883 11+OXYCO+ALC+CRT-BUND
Amphetamines, Urine: NEGATIVE ng/mL
BENZODIAZ UR QL: NEGATIVE ng/mL
Barbiturate screen, urine: NEGATIVE ng/mL
Cannabinoid Quant, Ur: NEGATIVE ng/mL
Cocaine (Metab.): NEGATIVE ng/mL
Creatinine, Urine: 280.2 mg/dL (ref 20.0–300.0)
Ethanol, Urine: NEGATIVE %
Meperidine: NEGATIVE ng/mL
Methadone Screen, Urine: NEGATIVE ng/mL
Nitrite Urine, Quantitative: NEGATIVE ug/mL
OPIATE SCREEN URINE: NEGATIVE ng/mL
Oxycodone/Oxymorphone, Urine: NEGATIVE ng/mL
PCP Quant, Ur: NEGATIVE ng/mL
Propoxyphene: NEGATIVE ng/mL
TRAMADOL: NEGATIVE ng/mL
pH, Urine: 5.1 (ref 4.5–8.9)

## 2024-07-22 ENCOUNTER — Ambulatory Visit: Admitting: Nurse Practitioner

## 2024-07-29 ENCOUNTER — Ambulatory Visit: Admitting: Neurology
